# Patient Record
Sex: Male | Born: 1935 | Race: White | Hispanic: No | Smoking: Former smoker
Health system: Southern US, Community
[De-identification: ages and names within clinical notes are randomized; demographics above are authoritative.]

## PROBLEM LIST (undated history)

## (undated) DIAGNOSIS — I739 Peripheral vascular disease, unspecified: Secondary | ICD-10-CM

## (undated) DIAGNOSIS — Z9889 Other specified postprocedural states: Secondary | ICD-10-CM

## (undated) DIAGNOSIS — A4902 Methicillin resistant Staphylococcus aureus infection, unspecified site: Secondary | ICD-10-CM

## (undated) DIAGNOSIS — Z8679 Personal history of other diseases of the circulatory system: Secondary | ICD-10-CM

## (undated) DIAGNOSIS — M5136 Other intervertebral disc degeneration, lumbar region: Secondary | ICD-10-CM

## (undated) DIAGNOSIS — E119 Type 2 diabetes mellitus without complications: Secondary | ICD-10-CM

## (undated) DIAGNOSIS — I509 Heart failure, unspecified: Secondary | ICD-10-CM

## (undated) DIAGNOSIS — J111 Influenza due to unidentified influenza virus with other respiratory manifestations: Secondary | ICD-10-CM

## (undated) DIAGNOSIS — M810 Age-related osteoporosis without current pathological fracture: Secondary | ICD-10-CM

## (undated) DIAGNOSIS — M51369 Other intervertebral disc degeneration, lumbar region without mention of lumbar back pain or lower extremity pain: Secondary | ICD-10-CM

## (undated) DIAGNOSIS — K219 Gastro-esophageal reflux disease without esophagitis: Secondary | ICD-10-CM

## (undated) DIAGNOSIS — Z87891 Personal history of nicotine dependence: Secondary | ICD-10-CM

## (undated) DIAGNOSIS — I219 Acute myocardial infarction, unspecified: Secondary | ICD-10-CM

## (undated) DIAGNOSIS — I10 Essential (primary) hypertension: Secondary | ICD-10-CM

## (undated) DIAGNOSIS — I639 Cerebral infarction, unspecified: Secondary | ICD-10-CM

## (undated) DIAGNOSIS — Z9289 Personal history of other medical treatment: Secondary | ICD-10-CM

## (undated) DIAGNOSIS — R0600 Dyspnea, unspecified: Secondary | ICD-10-CM

## (undated) DIAGNOSIS — J302 Other seasonal allergic rhinitis: Secondary | ICD-10-CM

## (undated) DIAGNOSIS — E7800 Pure hypercholesterolemia, unspecified: Secondary | ICD-10-CM

## (undated) DIAGNOSIS — D649 Anemia, unspecified: Secondary | ICD-10-CM

## (undated) DIAGNOSIS — I6522 Occlusion and stenosis of left carotid artery: Secondary | ICD-10-CM

## (undated) HISTORY — DX: Influenza due to unidentified influenza virus with other respiratory manifestations: J11.1

## (undated) HISTORY — PX: CLEFT LIP REPAIR: SUR1164

## (undated) HISTORY — DX: Cerebral infarction, unspecified: I63.9

## (undated) HISTORY — DX: Heart failure, unspecified: I50.9

## (undated) HISTORY — DX: Personal history of nicotine dependence: Z87.891

## (undated) HISTORY — PX: ILIAC ARTERY STENT: SHX1786

## (undated) HISTORY — DX: Acute myocardial infarction, unspecified: I21.9

## (undated) HISTORY — DX: Anemia, unspecified: D64.9

## (undated) HISTORY — PX: VASCULAR SURGERY: SHX849

## (undated) HISTORY — PX: HEMIARTHROPLASTY HIP: SUR652

---

## 1997-03-31 HISTORY — PX: PR VEIN BYPASS GRAFT,AORTO-FEM-POP: 35551

## 1997-08-14 ENCOUNTER — Ambulatory Visit (HOSPITAL_COMMUNITY): Admission: RE | Admit: 1997-08-14 | Discharge: 1997-08-14 | Payer: Self-pay | Admitting: *Deleted

## 1997-08-18 ENCOUNTER — Inpatient Hospital Stay (HOSPITAL_COMMUNITY): Admission: RE | Admit: 1997-08-18 | Discharge: 1997-08-19 | Payer: Self-pay | Admitting: *Deleted

## 1999-04-01 HISTORY — PX: BACK SURGERY: SHX140

## 1999-08-06 ENCOUNTER — Other Ambulatory Visit: Admission: RE | Admit: 1999-08-06 | Discharge: 1999-08-06 | Payer: Self-pay | Admitting: *Deleted

## 1999-11-11 ENCOUNTER — Ambulatory Visit (HOSPITAL_COMMUNITY): Admission: RE | Admit: 1999-11-11 | Discharge: 1999-11-11 | Payer: Self-pay | Admitting: Neurosurgery

## 1999-11-11 ENCOUNTER — Encounter: Payer: Self-pay | Admitting: Neurosurgery

## 1999-12-13 ENCOUNTER — Encounter: Payer: Self-pay | Admitting: Neurosurgery

## 1999-12-13 ENCOUNTER — Ambulatory Visit (HOSPITAL_COMMUNITY): Admission: RE | Admit: 1999-12-13 | Discharge: 1999-12-13 | Payer: Self-pay | Admitting: Neurosurgery

## 1999-12-27 ENCOUNTER — Ambulatory Visit (HOSPITAL_COMMUNITY): Admission: RE | Admit: 1999-12-27 | Discharge: 1999-12-27 | Payer: Self-pay | Admitting: Neurosurgery

## 1999-12-27 ENCOUNTER — Encounter: Payer: Self-pay | Admitting: Neurosurgery

## 2000-04-13 ENCOUNTER — Encounter: Payer: Self-pay | Admitting: Neurosurgery

## 2000-04-16 ENCOUNTER — Inpatient Hospital Stay (HOSPITAL_COMMUNITY): Admission: RE | Admit: 2000-04-16 | Discharge: 2000-04-18 | Payer: Self-pay | Admitting: Neurosurgery

## 2000-04-16 ENCOUNTER — Encounter: Payer: Self-pay | Admitting: Neurosurgery

## 2000-05-06 ENCOUNTER — Encounter: Payer: Self-pay | Admitting: Neurosurgery

## 2000-05-06 ENCOUNTER — Ambulatory Visit (HOSPITAL_COMMUNITY): Admission: RE | Admit: 2000-05-06 | Discharge: 2000-05-06 | Payer: Self-pay | Admitting: Neurosurgery

## 2000-05-08 ENCOUNTER — Ambulatory Visit (HOSPITAL_COMMUNITY): Admission: RE | Admit: 2000-05-08 | Discharge: 2000-05-08 | Payer: Self-pay | Admitting: Neurosurgery

## 2000-05-08 ENCOUNTER — Encounter: Payer: Self-pay | Admitting: Neurosurgery

## 2000-05-09 ENCOUNTER — Emergency Department (HOSPITAL_COMMUNITY): Admission: EM | Admit: 2000-05-09 | Discharge: 2000-05-09 | Payer: Self-pay | Admitting: *Deleted

## 2000-05-10 ENCOUNTER — Emergency Department (HOSPITAL_COMMUNITY): Admission: EM | Admit: 2000-05-10 | Discharge: 2000-05-10 | Payer: Self-pay | Admitting: Emergency Medicine

## 2000-05-11 ENCOUNTER — Encounter (HOSPITAL_COMMUNITY): Admission: RE | Admit: 2000-05-11 | Discharge: 2000-08-09 | Payer: Self-pay | Admitting: Neurosurgery

## 2000-05-16 ENCOUNTER — Emergency Department (HOSPITAL_COMMUNITY): Admission: EM | Admit: 2000-05-16 | Discharge: 2000-05-16 | Payer: Self-pay | Admitting: Emergency Medicine

## 2000-05-17 ENCOUNTER — Emergency Department (HOSPITAL_COMMUNITY): Admission: EM | Admit: 2000-05-17 | Discharge: 2000-05-17 | Payer: Self-pay | Admitting: Emergency Medicine

## 2000-05-23 ENCOUNTER — Emergency Department (HOSPITAL_COMMUNITY): Admission: EM | Admit: 2000-05-23 | Discharge: 2000-05-23 | Payer: Self-pay | Admitting: *Deleted

## 2000-05-24 ENCOUNTER — Emergency Department (HOSPITAL_COMMUNITY): Admission: EM | Admit: 2000-05-24 | Discharge: 2000-05-24 | Payer: Self-pay | Admitting: Emergency Medicine

## 2000-05-30 ENCOUNTER — Emergency Department (HOSPITAL_COMMUNITY): Admission: EM | Admit: 2000-05-30 | Discharge: 2000-05-30 | Payer: Self-pay | Admitting: Emergency Medicine

## 2000-05-31 ENCOUNTER — Emergency Department (HOSPITAL_COMMUNITY): Admission: EM | Admit: 2000-05-31 | Discharge: 2000-05-31 | Payer: Self-pay | Admitting: Emergency Medicine

## 2000-06-02 ENCOUNTER — Ambulatory Visit (HOSPITAL_COMMUNITY): Admission: RE | Admit: 2000-06-02 | Discharge: 2000-06-02 | Payer: Self-pay | Admitting: Neurosurgery

## 2000-06-02 ENCOUNTER — Encounter: Payer: Self-pay | Admitting: Neurosurgery

## 2000-07-16 ENCOUNTER — Encounter: Admission: RE | Admit: 2000-07-16 | Discharge: 2000-07-16 | Payer: Self-pay | Admitting: Neurosurgery

## 2000-07-16 ENCOUNTER — Encounter: Payer: Self-pay | Admitting: Neurosurgery

## 2000-07-20 ENCOUNTER — Inpatient Hospital Stay (HOSPITAL_COMMUNITY): Admission: EM | Admit: 2000-07-20 | Discharge: 2000-09-28 | Payer: Self-pay | Admitting: Emergency Medicine

## 2000-07-20 ENCOUNTER — Encounter (INDEPENDENT_AMBULATORY_CARE_PROVIDER_SITE_OTHER): Payer: Self-pay | Admitting: *Deleted

## 2000-07-21 ENCOUNTER — Encounter: Payer: Self-pay | Admitting: Internal Medicine

## 2000-07-23 ENCOUNTER — Encounter: Payer: Self-pay | Admitting: Internal Medicine

## 2000-07-28 ENCOUNTER — Encounter: Payer: Self-pay | Admitting: Neurosurgery

## 2000-07-28 ENCOUNTER — Encounter: Payer: Self-pay | Admitting: Internal Medicine

## 2000-07-29 ENCOUNTER — Encounter: Payer: Self-pay | Admitting: Internal Medicine

## 2000-07-29 HISTORY — PX: OTHER SURGICAL HISTORY: SHX169

## 2000-07-30 ENCOUNTER — Encounter: Payer: Self-pay | Admitting: Internal Medicine

## 2000-07-31 ENCOUNTER — Encounter: Payer: Self-pay | Admitting: Internal Medicine

## 2000-08-05 ENCOUNTER — Encounter: Payer: Self-pay | Admitting: Internal Medicine

## 2000-08-06 ENCOUNTER — Encounter: Payer: Self-pay | Admitting: Internal Medicine

## 2000-08-11 ENCOUNTER — Encounter: Payer: Self-pay | Admitting: Internal Medicine

## 2000-08-18 ENCOUNTER — Encounter: Payer: Self-pay | Admitting: Infectious Diseases

## 2000-08-20 ENCOUNTER — Encounter: Payer: Self-pay | Admitting: Internal Medicine

## 2000-08-21 ENCOUNTER — Encounter: Payer: Self-pay | Admitting: Internal Medicine

## 2000-08-31 ENCOUNTER — Encounter: Payer: Self-pay | Admitting: Internal Medicine

## 2000-09-01 ENCOUNTER — Encounter: Payer: Self-pay | Admitting: Internal Medicine

## 2000-09-03 ENCOUNTER — Encounter: Payer: Self-pay | Admitting: Internal Medicine

## 2000-09-07 ENCOUNTER — Encounter: Payer: Self-pay | Admitting: Internal Medicine

## 2000-09-11 ENCOUNTER — Encounter: Payer: Self-pay | Admitting: Surgery

## 2000-09-12 ENCOUNTER — Encounter: Payer: Self-pay | Admitting: Surgery

## 2000-09-13 ENCOUNTER — Encounter: Payer: Self-pay | Admitting: Surgery

## 2000-09-14 ENCOUNTER — Encounter: Payer: Self-pay | Admitting: *Deleted

## 2000-09-18 ENCOUNTER — Encounter: Payer: Self-pay | Admitting: Surgery

## 2000-09-28 ENCOUNTER — Inpatient Hospital Stay: Admission: RE | Admit: 2000-09-28 | Discharge: 2000-10-26 | Payer: Self-pay | Admitting: Internal Medicine

## 2000-10-12 ENCOUNTER — Encounter: Payer: Self-pay | Admitting: Internal Medicine

## 2000-10-12 ENCOUNTER — Ambulatory Visit (HOSPITAL_COMMUNITY): Admission: RE | Admit: 2000-10-12 | Discharge: 2000-10-12 | Payer: Self-pay | Admitting: Internal Medicine

## 2000-10-19 ENCOUNTER — Ambulatory Visit (HOSPITAL_COMMUNITY): Admission: RE | Admit: 2000-10-19 | Discharge: 2000-10-19 | Payer: Self-pay | Admitting: Internal Medicine

## 2000-10-19 ENCOUNTER — Encounter: Payer: Self-pay | Admitting: Internal Medicine

## 2000-12-10 ENCOUNTER — Encounter: Payer: Self-pay | Admitting: Neurosurgery

## 2000-12-10 ENCOUNTER — Encounter: Admission: RE | Admit: 2000-12-10 | Discharge: 2000-12-10 | Payer: Self-pay | Admitting: Neurosurgery

## 2000-12-22 ENCOUNTER — Ambulatory Visit (HOSPITAL_COMMUNITY): Admission: RE | Admit: 2000-12-22 | Discharge: 2000-12-22 | Payer: Self-pay | Admitting: *Deleted

## 2000-12-22 ENCOUNTER — Encounter: Payer: Self-pay | Admitting: *Deleted

## 2001-01-08 ENCOUNTER — Ambulatory Visit (HOSPITAL_COMMUNITY): Admission: RE | Admit: 2001-01-08 | Discharge: 2001-01-08 | Payer: Self-pay | Admitting: *Deleted

## 2001-01-18 ENCOUNTER — Encounter: Admission: RE | Admit: 2001-01-18 | Discharge: 2001-04-18 | Payer: Self-pay | Admitting: Internal Medicine

## 2001-01-22 ENCOUNTER — Encounter: Payer: Self-pay | Admitting: *Deleted

## 2001-01-22 ENCOUNTER — Ambulatory Visit (HOSPITAL_COMMUNITY): Admission: RE | Admit: 2001-01-22 | Discharge: 2001-01-22 | Payer: Self-pay | Admitting: *Deleted

## 2001-01-27 ENCOUNTER — Encounter: Admission: RE | Admit: 2001-01-27 | Discharge: 2001-03-02 | Payer: Self-pay | Admitting: Internal Medicine

## 2001-02-22 ENCOUNTER — Encounter: Admission: RE | Admit: 2001-02-22 | Discharge: 2001-02-22 | Payer: Self-pay | Admitting: Infectious Diseases

## 2001-02-24 ENCOUNTER — Encounter: Payer: Self-pay | Admitting: Infectious Diseases

## 2001-02-24 ENCOUNTER — Ambulatory Visit (HOSPITAL_COMMUNITY): Admission: RE | Admit: 2001-02-24 | Discharge: 2001-02-24 | Payer: Self-pay | Admitting: Infectious Diseases

## 2001-03-01 ENCOUNTER — Encounter: Admission: RE | Admit: 2001-03-01 | Discharge: 2001-03-01 | Payer: Self-pay | Admitting: Infectious Diseases

## 2001-03-02 ENCOUNTER — Inpatient Hospital Stay (HOSPITAL_COMMUNITY): Admission: AD | Admit: 2001-03-02 | Discharge: 2001-03-05 | Payer: Self-pay | Admitting: Internal Medicine

## 2001-03-04 ENCOUNTER — Encounter: Payer: Self-pay | Admitting: Internal Medicine

## 2001-03-25 ENCOUNTER — Encounter: Admission: RE | Admit: 2001-03-25 | Discharge: 2001-06-23 | Payer: Self-pay | Admitting: Internal Medicine

## 2001-03-30 ENCOUNTER — Encounter: Admission: RE | Admit: 2001-03-30 | Discharge: 2001-04-12 | Payer: Self-pay | Admitting: Orthopedic Surgery

## 2001-03-31 HISTORY — PX: JOINT REPLACEMENT: SHX530

## 2001-03-31 HISTORY — PX: THORACIC AORTIC ENDOVASCULAR STENT GRAFT: SHX6112

## 2001-05-06 ENCOUNTER — Encounter: Admission: RE | Admit: 2001-05-06 | Discharge: 2001-08-04 | Payer: Self-pay | Admitting: Internal Medicine

## 2001-06-16 ENCOUNTER — Encounter: Payer: Self-pay | Admitting: *Deleted

## 2001-06-16 ENCOUNTER — Encounter: Admission: RE | Admit: 2001-06-16 | Discharge: 2001-06-16 | Payer: Self-pay | Admitting: *Deleted

## 2001-07-13 ENCOUNTER — Encounter (HOSPITAL_BASED_OUTPATIENT_CLINIC_OR_DEPARTMENT_OTHER): Admission: RE | Admit: 2001-07-13 | Discharge: 2001-07-19 | Payer: Self-pay | Admitting: Orthopedic Surgery

## 2001-08-02 ENCOUNTER — Encounter: Payer: Self-pay | Admitting: Neurosurgery

## 2001-08-02 ENCOUNTER — Ambulatory Visit (HOSPITAL_COMMUNITY): Admission: RE | Admit: 2001-08-02 | Discharge: 2001-08-02 | Payer: Self-pay | Admitting: Neurosurgery

## 2001-11-01 ENCOUNTER — Encounter (HOSPITAL_BASED_OUTPATIENT_CLINIC_OR_DEPARTMENT_OTHER): Admission: RE | Admit: 2001-11-01 | Discharge: 2002-01-30 | Payer: Self-pay | Admitting: Orthopedic Surgery

## 2001-11-04 ENCOUNTER — Encounter: Admission: RE | Admit: 2001-11-04 | Discharge: 2002-02-02 | Payer: Self-pay | Admitting: Internal Medicine

## 2001-12-09 IMAGING — RF IR CENTRAL LINE *R*
1 series · 1 of 1 positions shown · non-contrast
Comparison: none

FINDINGS
CLINICAL DATA: PATIENT HAS SEPSIS WITH MRSA. PATIENT HAS NEED FOR IV ACCESS.
ULTRASOUND GUIDED VENOUS ACCESS:
USING STANDARD STERILE 21 GAUGE SINGLE WALL TECHNIQUE AND DIRECT ULTRASOUND VISUALIZATION, THE
RIGHT BRACHIAL VEIN WAS ACCESSED WITHOUT DIFFICULTY.
IMPRESSION
STATUS-POST ULTRASOUND GUIDED VENOUS ACCESS.
FIVE FRENCH DOUBLE LUMEN PICC LINE PLACEMENT:
USING STANDARD STERILE FLUOROSCOPIC TECHNIQUE, A 40 CM, FIVE FRENCH DOUBLE LUMEN PICC LINE WAS
PLACED VIA THE RIGHT UPPER EXTREMITY WITH THE TIP AT THE JUNCTION OF THE SUPERIOR VENA CAVA AND
RIGHT ATRIUM. THE CATHETER WAS ANCHORED TO THE SKIN WITH PROLENE SUTURE, FLUSHED WITH HEPARIN, AND
IS READY FOR IMMEDIATE USE.

[Series 1: run · 1 of 1 slices shown]
[im 1/1]
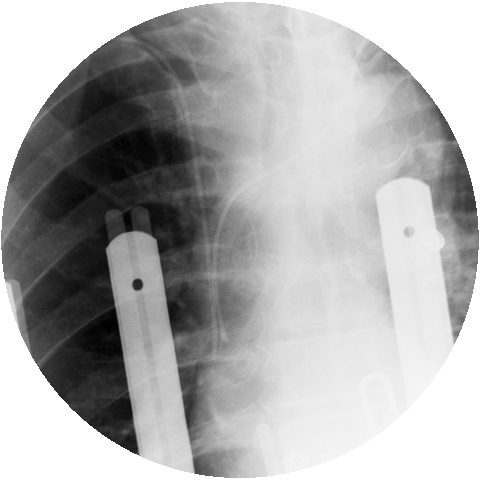

[1 of 1 positions shown; findings below may reference images not displayed]

IMPRESSION: STATUS-POST FIVE FRENCH DUAL LUMEN PICC LINE PLACEMENT.

## 2002-01-13 ENCOUNTER — Encounter: Admission: RE | Admit: 2002-01-13 | Discharge: 2002-01-24 | Payer: Self-pay | Admitting: Internal Medicine

## 2002-01-28 ENCOUNTER — Inpatient Hospital Stay (HOSPITAL_COMMUNITY): Admission: RE | Admit: 2002-01-28 | Discharge: 2002-02-01 | Payer: Self-pay | Admitting: Orthopedic Surgery

## 2002-01-28 ENCOUNTER — Encounter: Payer: Self-pay | Admitting: Orthopedic Surgery

## 2002-01-29 ENCOUNTER — Encounter: Payer: Self-pay | Admitting: Orthopedic Surgery

## 2002-02-01 ENCOUNTER — Inpatient Hospital Stay (HOSPITAL_COMMUNITY)
Admission: RE | Admit: 2002-02-01 | Discharge: 2002-02-07 | Payer: Self-pay | Admitting: Physical Medicine & Rehabilitation

## 2002-02-14 ENCOUNTER — Encounter (HOSPITAL_BASED_OUTPATIENT_CLINIC_OR_DEPARTMENT_OTHER): Admission: RE | Admit: 2002-02-14 | Discharge: 2002-05-15 | Payer: Self-pay | Admitting: Internal Medicine

## 2002-03-22 ENCOUNTER — Encounter: Admission: RE | Admit: 2002-03-22 | Discharge: 2002-04-20 | Payer: Self-pay | Admitting: Orthopedic Surgery

## 2002-05-05 ENCOUNTER — Encounter: Admission: RE | Admit: 2002-05-05 | Discharge: 2002-08-03 | Payer: Self-pay | Admitting: Internal Medicine

## 2002-05-20 ENCOUNTER — Encounter (HOSPITAL_BASED_OUTPATIENT_CLINIC_OR_DEPARTMENT_OTHER): Admission: RE | Admit: 2002-05-20 | Discharge: 2002-08-18 | Payer: Self-pay | Admitting: Internal Medicine

## 2002-11-01 ENCOUNTER — Encounter: Admission: RE | Admit: 2002-11-01 | Discharge: 2003-01-30 | Payer: Self-pay | Admitting: Internal Medicine

## 2002-11-24 ENCOUNTER — Encounter (HOSPITAL_BASED_OUTPATIENT_CLINIC_OR_DEPARTMENT_OTHER): Admission: RE | Admit: 2002-11-24 | Discharge: 2002-12-06 | Payer: Self-pay | Admitting: Internal Medicine

## 2003-03-11 ENCOUNTER — Emergency Department (HOSPITAL_COMMUNITY): Admission: EM | Admit: 2003-03-11 | Discharge: 2003-03-12 | Payer: Self-pay | Admitting: Emergency Medicine

## 2003-05-09 ENCOUNTER — Encounter: Admission: RE | Admit: 2003-05-09 | Discharge: 2003-08-07 | Payer: Self-pay | Admitting: Internal Medicine

## 2003-05-25 ENCOUNTER — Encounter (HOSPITAL_BASED_OUTPATIENT_CLINIC_OR_DEPARTMENT_OTHER): Admission: RE | Admit: 2003-05-25 | Discharge: 2003-06-05 | Payer: Self-pay | Admitting: Internal Medicine

## 2003-11-13 ENCOUNTER — Encounter: Admission: RE | Admit: 2003-11-13 | Discharge: 2003-11-13 | Payer: Self-pay | Admitting: Internal Medicine

## 2003-11-15 ENCOUNTER — Encounter (HOSPITAL_BASED_OUTPATIENT_CLINIC_OR_DEPARTMENT_OTHER): Admission: RE | Admit: 2003-11-15 | Discharge: 2003-11-23 | Payer: Self-pay | Admitting: Internal Medicine

## 2004-06-11 ENCOUNTER — Encounter (HOSPITAL_BASED_OUTPATIENT_CLINIC_OR_DEPARTMENT_OTHER): Admission: RE | Admit: 2004-06-11 | Discharge: 2004-06-21 | Payer: Self-pay | Admitting: Internal Medicine

## 2006-08-14 ENCOUNTER — Ambulatory Visit: Payer: Self-pay | Admitting: *Deleted

## 2007-02-22 ENCOUNTER — Ambulatory Visit: Payer: Self-pay | Admitting: *Deleted

## 2007-08-19 ENCOUNTER — Ambulatory Visit: Payer: Self-pay | Admitting: *Deleted

## 2008-02-03 ENCOUNTER — Ambulatory Visit: Payer: Self-pay | Admitting: *Deleted

## 2008-08-10 ENCOUNTER — Ambulatory Visit: Payer: Self-pay | Admitting: *Deleted

## 2009-01-31 ENCOUNTER — Ambulatory Visit: Payer: Self-pay | Admitting: Vascular Surgery

## 2009-08-10 ENCOUNTER — Ambulatory Visit: Payer: Self-pay | Admitting: Vascular Surgery

## 2009-08-23 ENCOUNTER — Ambulatory Visit (HOSPITAL_COMMUNITY): Admission: RE | Admit: 2009-08-23 | Discharge: 2009-08-23 | Payer: Self-pay | Admitting: Gastroenterology

## 2009-09-12 ENCOUNTER — Ambulatory Visit: Payer: Self-pay | Admitting: Vascular Surgery

## 2009-10-29 HISTORY — PX: TRANSTHORACIC ECHOCARDIOGRAM: SHX275

## 2010-03-20 ENCOUNTER — Ambulatory Visit: Payer: Self-pay | Admitting: Vascular Surgery

## 2010-05-06 NOTE — Procedures (Unsigned)
CAROTID DUPLEX EXAM  INDICATION:  Carotid disease.  HISTORY: Diabetes:  Yes. Cardiac:  No. Hypertension:  Yes. Smoking:  Previous. Previous Surgery:  Innominate and aortic arch replacement. CV History:  Currently asymptomatic. Amaurosis Fugax No, Paresthesias No, Hemiparesis No                                      RIGHT             LEFT Brachial systolic pressure:         125               132 Brachial Doppler waveforms:         Normal            Normal Vertebral direction of flow:        Antegrade         Antegrade DUPLEX VELOCITIES (cm/sec) CCA peak systolic                   101               Occluded ECA peak systolic                   162               53 (retrograde) ICA peak systolic                   223               100 ICA end diastolic                   42                37 PLAQUE MORPHOLOGY:                  Heterogeneous     Heterogeneous PLAQUE AMOUNT:                      Moderate          Occlusive PLAQUE LOCATION:                    ICA / ECA         ICA / CCA  IMPRESSION: 1. Doppler velocities suggest a high end 40% to 59% stenosis of the     right proximal internal carotid artery. 2. The left common carotid artery appears to be totally occluded with     no hemodynamically significant stenosis at the left internal     carotid artery which is supplied via a retrograde external carotid     artery. 3. No significant change in the velocities of the bilateral internal     carotid arteries noted when compared to the previous exam.     However, the left common carotid artery now appears to be occluded. 4. Based on the above findings, and at the advisement of Dr. Imogene Burn on     04/26/2010, an appointment to see me in the office was scheduled.      ___________________________________________ Janetta Hora Fields, MD  CH/MEDQ  D:  04/29/2010  T:  04/29/2010  Job:  [id number]

## 2010-05-06 NOTE — Procedures (Unsigned)
BYPASS GRAFT EVALUATION  INDICATION:  Left lower extremity bypass graft.  HISTORY: Diabetes:  Yes. Cardiac:  No. Hypertension:  Yes. Smoking:  Previous. Previous Surgery:  Repair of left fem-pop bypass graft stenosis in 1999, right external iliac artery PTA in 1997.  SINGLE LEVEL ARTERIAL EXAM                              RIGHT              LEFT Brachial:                    125                132 Anterior tibial:             93                 128 Posterior tibial:            98                 138 Peroneal: Ankle/brachial index:        0.74               1.05  PREVIOUS ABI:  Date:  08/10/2009  RIGHT:  0.71  LEFT:  1.01  LOWER EXTREMITY BYPASS GRAFT DUPLEX EXAM:  DUPLEX:  Biphasic Doppler waveforms noted throughout the left lower extremity bypass graft with an increased velocity of 290 cm/s noted at the left proximal anastomosis.  This area of increased velocity appears to be due to significant plaque formation of the left common femoral artery since no internal narrowing of the bypass graft is noted.  IMPRESSION: 1. Patent left femoral-popliteal bypass graft with an increased     velocity noted as described above. 2. Bilateral ankle brachial indices and Doppler waveforms suggest     moderately decreased perfusion of the right lower extremity and     normal perfusion of the left lower extremity.  The bilateral ankle     brachial indices appears stable when compared to the previous exam.  ___________________________________________ Janetta Hora. Fields, MD  CH/MEDQ  D:  04/29/2010  T:  04/29/2010  Job:  [id number]

## 2010-05-09 ENCOUNTER — Ambulatory Visit (INDEPENDENT_AMBULATORY_CARE_PROVIDER_SITE_OTHER): Payer: Medicare Other | Admitting: Vascular Surgery

## 2010-05-09 DIAGNOSIS — I6529 Occlusion and stenosis of unspecified carotid artery: Secondary | ICD-10-CM

## 2010-05-10 NOTE — Assessment & Plan Note (Signed)
OFFICE VISIT  Christopher, [name] DOB:  [birth year]                                       05/09/2010 ZOXWR#:[id number]  The patient returns for followup today.  He previously underwent an aorto to innominate and left carotid bypass by Dr. Madilyn Fireman in 2002.  He was noted on recent surveillance duplex ultrasound to have occlusion of the left common carotid artery.  Of note, he has previously had a left femoral-popliteal bypass in 1999 and recently was noted to have increased velocities about 7 months ago but refused arteriogram at that time.  The patient currently is on aspirin.  He denies any signs or symptoms of stroke, TIA or amaurosis.  He denies any fever or chills.  He denies any claudication symptoms in his lower extremities.  REVIEW OF SYSTEMS:  He denies any shortness of breath or chest pain.  CHRONIC MEDICAL PROBLEMS:  Include degenerative arthritis, elevated triglycerides, diabetes, hypertension, chronic back pain.  All of these are currently controlled and followed by Dr. Waynard Edwards.  PHYSICAL EXAM:  Vital signs:  Blood pressure is 125/70 in the right arm, 153/65 in the left arm, heart rate 69 and regular, respirations 22. HEENT:  Unremarkable.  Neck:  Has 2+ carotid pulses with bilateral bruits.  Chest:  Clear to auscultation.  Cardiac:  Regular rate and rhythm with a bruit heard throughout the precordium.  Upper extremities: He has 2+ brachial and absent radial pulses bilaterally.  Abdomen: Soft, nontender, nondistended.  Lower extremities:  He has a 2+ left posterior tibial pulse.  He has absent pedal pulses in the right foot. Neurological:  Shows symmetric upper extremity and lower extremity strength which is 5/5.  Skin:  Has no open ulcers or rashes.  In summary, the patient has had an occlusion of his left common carotid bypass.  He is asymptomatic from this.  Most likely this will not need any further intervention.  However, since his bypass was  originally done in the face of possible infection I believe he needs a CT scan of the neck and chest to make sure that the bypass graft is otherwise intact and he has no other signs and symptoms of infection for the remaining portion of the graft.  He will follow up in 2 weeks' time to review the CT findings.    Janetta Hora. Noah Pelaez, MD Electronically Signed  CEF/MEDQ  D:  05/09/2010  T:  05/10/2010  Job:  [id number]  cc:   Loraine Leriche A. [name], M.D. Dr Jamesetta So

## 2010-05-15 ENCOUNTER — Other Ambulatory Visit: Payer: Self-pay | Admitting: Vascular Surgery

## 2010-05-15 DIAGNOSIS — I7090 Unspecified atherosclerosis: Secondary | ICD-10-CM

## 2010-05-30 ENCOUNTER — Ambulatory Visit
Admission: RE | Admit: 2010-05-30 | Discharge: 2010-05-30 | Disposition: A | Discharge status: Home / Self Care | Payer: Medicare Other | Source: Ambulatory Visit | Attending: Vascular Surgery | Admitting: Vascular Surgery

## 2010-05-30 ENCOUNTER — Ambulatory Visit (INDEPENDENT_AMBULATORY_CARE_PROVIDER_SITE_OTHER): Payer: Medicare Other | Admitting: Vascular Surgery

## 2010-05-30 DIAGNOSIS — I7090 Unspecified atherosclerosis: Secondary | ICD-10-CM

## 2010-05-30 DIAGNOSIS — I6529 Occlusion and stenosis of unspecified carotid artery: Secondary | ICD-10-CM

## 2010-05-30 MED ORDER — IOHEXOL 350 MG/ML SOLN
150.0000 mL | Freq: Once | INTRAVENOUS | Status: AC | PRN
  Administered 2010-05-30: 150 mL via INTRAVENOUS

## 2010-05-31 NOTE — Assessment & Plan Note (Signed)
OFFICE VISIT  [name], [name] DOB:  1930/11/03                                       05/30/2010 ZOXWR#:[id number]  The patient returns for followup today.  He was last seen on 05/09/2010. At that time he was noted to have occlusion of his left common carotid bypass graft that had been previously done by Christopher. Madilyn Fireman.  He was sent for CT angiogram of the neck to make sure there was no evidence of infection since the graft had been placed in the presence of infection previously.  He continues to deny any fever or chills and feels well overall.  He has had no complaints.  He has no symptoms of TIA, amaurosis or stroke.  REVIEW OF SYSTEMS:  He denies any fever or chills, weight loss.  He denies any skin ulcerations.  PHYSICAL EXAMINATION:  Blood pressure is 157/84 in the right arm, 181/72 in the left arm, heart rate is 111 and regular, respirations 16.  Neck: Has 2+ right carotid pulse, has absent left carotid pulse.  CT angiogram of the neck and chest were reviewed today which shows a patent limb to the right common carotid artery.  The limb to the left common carotid artery is occluded as suggested on ultrasound.  There is reconstitution of the distal internal carotid artery.  There was no perigraft fluid.  There was no perigraft edema.  There was no air round the graft suggesting infection.  At this point the patient has occlusion of the left common carotid artery bypass graft.  There is no indication at this point to redo this bypass as he does not have any symptoms from this and his right side is widely patent.  I believe the best option at this point would be to obtain a followup duplex scan in 6 months' time and continued surveillance.  All the findings were discussed with the patient today. He will return for followup in 6 months' time.    Christopher Hora. Orel Cooler, MD Electronically Signed  CEF/MEDQ  D:  05/30/2010  T:  05/31/2010  Job:  [id number]  cc:    [name], M.D. [name]

## 2010-06-07 ENCOUNTER — Encounter (HOSPITAL_COMMUNITY): Payer: Medicare Other | Attending: Internal Medicine

## 2010-06-07 DIAGNOSIS — M810 Age-related osteoporosis without current pathological fracture: Secondary | ICD-10-CM | POA: Insufficient documentation

## 2010-08-13 NOTE — Procedures (Signed)
BYPASS GRAFT EVALUATION   INDICATION:  Left fem-pop bypass graft.   HISTORY:  Diabetes:  Yes.  Cardiac:  Innominate aortic arch replacement.  Hypertension:  Yes.  Smoking:  Previous.  Previous Surgery:  Repair of stenosis in the left fem-pop graft in 1999,  right EIA PTA in 1997.   SINGLE LEVEL ARTERIAL EXAM                               RIGHT              LEFT  Brachial:                    132                147  Anterior tibial:             86                 138  Posterior tibial:            105                148  Peroneal:  Ankle/brachial index:        0.71               1.01   PREVIOUS ABI:  Date: 02/01/09  RIGHT:  0.70  LEFT:  0.95   LOWER EXTREMITY BYPASS GRAFT DUPLEX EXAM:   DUPLEX:  Biphasic Doppler waveforms noted throughout the left lower  extremity bypass graft and its native vessels with an increased velocity  of 306 cm/s noted at the proximal anastomosis level.   IMPRESSION:  1. Patent left femoropopliteal bypass graft with increased velocities      noted, as described above.  2. Stable ankle brachial indices.        ___________________________________________  Janetta Hora Fields, MD   NT/MEDQ  D:  08/10/2009  T:  08/10/2009  Job:  [id number]

## 2010-08-13 NOTE — Consult Note (Signed)
NEW PATIENT CONSULTATION   [name]  DOB:  05-05-1930                                       08/10/2009  EAVWU#:[id number]   REASON FOR VISIT:  Continued follow-up of left femoral-popliteal bypass  graft and ascending aorta innominate artery bypass and left common  carotid artery bypass.   Patient is a 77 year old Caucasian male with a complicated past medical  history.  He is status post a left femoral-popliteal artery bypass with  saphenous vein graft in November 1998.  This required revision for vein  graft stenosis in 08/16/1997.  These were both done by Dr. Denman George.  In June 2002, he underwent ascending aorta to innominate artery  bypass and left common carotid artery bypass by Dr. Evelene Croon and Dr.  Denman George.  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  Rifampin-  coated grafts were utilized.  He had an extensive hospitalization and  actually was known to have a descending thoracic penetrating ulcer which  was ultimately treated in [facility], which sounds like a stent graft.  This was done by Dr. Pattricia Boss, and in fact the patient was supposed to see  him last month and had rescheduled due to his son's hospitalization.  He  has not been seen by a physician in our office since November 2002.  He  has been coming in approximately every 6 months, however, for P scans to  evaluate his left lower extremity bypass graft.  He reports that he has  had some slight increase in his left lower extremity numbness,  particularly in his toes, over the past year.  He has had chronic  numbness in his legs  since 2002 and says he has chronic back pain from  degenerative disk disease.  His back pain radiates to bilateral legs and  is worse with sitting.  He denies any specific claudication symptoms or  rest pain.  He has not had any foot ulcers either.  He ambulates with a  cane or rolling walker.  His mobility distances is somewhat limited, but  at this point he is able to go shop for his own groceries.   Past medical history is significant for right femoral neck fracture,  status post right hip hemiarthroplasty by Dr. Despina Hick in 2003.  MRSA  bacteremia in February 2002, as mentioned, secondary to epidural abscess  following a laminectomy, history of cleft palate, hypertriglyceridemia,  diabetes mellitus type 2, hypertension, chronic back pain with spinal  stenosis, insomnia, left lower extremity bypass, innominate and left  common carotid artery bypass, as discussed above.  He also suffers from  cataracts and diplopia.  He says the diplopia has been present since  2002.   His family history is significant for a son with Marfan's syndrome, who  had surgery at [facility] for placement of aortic valve and aortic  root.  He said his father had heart trouble in his 33s.  He is widowed  and retired.  He quit smoking  in 2002 and does not drink alcohol on a  regular basis.   REVIEW OF SYSTEMS:  Positive for no significant weight changes with  stated weight of 168, height of 5 feet 5 inches.  He denies any chest  pain or cardiac arrhythmias.  He does get shortness of breath with  exertion.  GI.  He reports severe constipation and reports he has a colonoscopy  scheduled in 2 weeks.  GU:  He denies dysuria.  VASCULAR:  As stated above.  NEUROLOGIC:  He denies any syncope or seizures.  MUSCULOSKELETAL:  As stated above.  He has no specific joint pains but  does have chronic back pain.  PSYCHIATRIC:  He denies history of anxiety or depression.  ENT:  Other than the above-mentioned diplopia since  2002 during his  hospitalization, he reports no recent visual changes.  HEMATOLOGIC:  He denies any history of clotting disorders or anemia.  SKIN:  He denies any skin rashes.   He reports no known drug allergies.   MEDICATIONS:  He takes carvedilol 6.25 mg 1/2 tablet b.i.d., glimepiride  2 mg 1/4 tablet t.i.d., fluconazole spray 50 mcg 2 sprays daily in each  nostril, loratadine 10 mg daily, Lipitor 40 mg 1/2 tablet daily,  temazepam 30 mg at q.[name].s., benazepril 10 mg daily, hydrocodone/APAP  5/500 mg 2 tablets every 6 hours, omeprazole 20 mg daily, Cartia 240 mg  daily, doxazosin 2 mg daily, furosemide 20 mg daily, aspirin 81 mg  daily, iron 27 mg twice a day, chromium oxalate 400 mcg daily,  multivitamin, Centrum Silver 1 daily, vitamin D 200 IU daily, Slo-Niacin  500 mg daily, Tylenol P.M. 2 at night, BC powder p.r.n., MiraLAX once  daily, fish oil 1200 mg q.i.d.   He underwent 2 vascular studies today.  First he had a lower extremity  bypass graft duplex exam with ABIs.  ABI was stable at 0.71 on the right  and 1.01 on the left.  His previous ABIs in November 2010 were 0.7 on  the right and 0.95 on the left.   His left fem-pop bypass was patent; however, at the proximal vein graft,  there was a velocity of 306.  This was up from his previous velocity of  280.  He also had a carotid duplex which showed right internal carotid  artery stenosis at 60% to 79% stenosis and a left internal carotid  artery of 1% to 39% stenosis with bilateral vertebral artery with  antegrade flow.   Physical exam findings show blood pressure 179/77, heart rate 74,  respirations 20.  General Appearance:  This is an alert, pleasant male  in no acute distress.  He appears approximately his stated age.  His  head is normocephalic, atraumatic.  Pupils are equal.  He is wearing  glasses.  His sclerae are nonicteric.  He does have a mild dysarthria  with a history of cleft palate.  His lung sounds are clear  throughout.  His heart has a regular rate and rhythm.  He does have a right carotid  bruit.  No carotid bruits auscultated on the left.  He has no  significant peripheral edema.  He has 2+ radial pulses and 2+ femoral  pulses.  He had Doppler signals of his bilateral posterior tibial and  anterior tibial arteries and a right peroneal Doppler signal.  His  abdomen is soft, nontender, nondistended.  Neuro exam was nonfocal.  The  patient's strength was strong and symmetrical bilaterally, although he  does  exhibit some generalized weakness overall.  Skin showed no  ulcerations.  There is no cervical lymphadenopathy appreciated.  His  median sternotomy incision is healed well without signs of infection.  No sternal click appreciated.   Patient has continued to do overall well following his innominate artery  and left common carotid artery bypass.  He does continue to see Dr.  Pattricia Boss at [facility] for what sounds like a thoracic aortic stent.  Because he had a left common carotid artery bypass, we did ask for a  carotid duplex scan to be done today, as it appears this has not been  done in the past.  This does show moderate stenosis of the right  internal carotid artery.  He is asymptomatic.  In regards to his left  fem-pop bypass, it remains patent; however, he has had gradual increase  in velocities over the last year and half.  Velocities are now just over  300; however, his symptoms have remained overall stable other than mild  increase in numbness in his left toes.  He has no nonhealing wounds or  rest pain.   I did discuss the case with Dr. Gretta Began.  He favors continued 26-month  followup unless further increase in his velocities.  Will also plan for  57-month follow-up to evaluate his right internal carotid artery  stenosis.  I have asked that at the 72-month follow-up that he be seen by  one of our physicians so if further intervention is needed, they can  discuss that at that  time.  It would also give him a chance to become  reacquainted with one of our other vascular surgeons since Dr. Madilyn Fireman has  relocated.  I also told him to contact us sooner or go to the emergency  department if he has any neurologic changes and to call us if he has  worsening left leg symptoms such as rest pain or new ulcers.   Jerold Coombe, PA   Larina Earthly, M.D.  Electronically Signed   AWZ/MEDQ  D:  08/10/2009  T:  08/10/2009  Job:  [id number]   cc:   Dr. Merlyn Albert A. [name], M.D.

## 2010-08-13 NOTE — Assessment & Plan Note (Signed)
OFFICE VISIT   [name], [name]  DOB:  27-Jul-1930                                       09/12/2009  EXBMW#:[id number]   The patient presented to the office today for further evaluation for a  possible left leg bypass stenosis.  He was seen by our PA  Jerold Coombe on May 13.  At that time he had an ultrasound performed which  showed slowly increasing velocities in the proximal anastomosis of his  left fem-pop bypass.  His left fem-pop was originally performed in 1998  by Dr. Madilyn Fireman.  It was a  vein bypass above the knee.  This was revised  on the proximal anastomosis in 1999.  He currently is experiencing no  claudication symptoms.  He has no symptoms of rest pain.  The evaluation  was purely based on increased velocities on duplex ultrasound.   PAST MEDICAL HISTORY:  Remarkable for previous carotid bypass felt to be  secondary to infected aneurysm.  He has also previously had a right hip  hemiarthroplasty in 2003.  Chronic medical problems include elevated  triglycerides, diabetes, hypertension, chronic back pain, all of which  are controlled by his primary care physician.   FAMILY AND SOCIAL HISTORY:  Unchanged from May 13.   REVIEW OF SYSTEMS:  Also unchanged from May 13.   PHYSICAL EXAMINATION:  VITAL SIGNS:  Blood pressure is 161/68 in the  left arm, heart rate is 80 and regular, respirations 20.  HEENT:  Unremarkable.  NECK:  Has bilateral carotid bruits right greater than left.  CHEST:  Clear to auscultation bilaterally.  CARDIAC:  Regular rate and rhythm without murmur.  ABDOMEN:  Soft, nontender, nondistended.  No masses.  MUSCULOSKELETAL:  Exam has no major obvious joint deformities.  NEUROLOGIC:  Shows symmetric upper extremity and lower extremity motor  strength which is 5/5.  SKIN:  No open ulcers or rashes.  EXTREMITIES:  He has 2+ femoral pulses bilaterally.  He has a 2+  posterior tibial pulse on the left and 1+ dorsalis pedis  pulse.  He has  absent popliteal and pedal pulses on the right.  He has absent popliteal  pulse on the left.   I reviewed his vascular lab study dated Aug 10, 2009.  This showed a  carotid stenosis of 60% to 80% which was stable on the right side.   His ABIs were 1.01 on the left and 0.71 on the right which were also  stable.  However he has had slowly increasing velocities on the proximal  anastomosis of his left bypass which is now 306 cm/sec.   I discussed with the patient today his vascular ultrasound findings and  suggested to him that there be some proximal narrowing of his left  bypass graft.  I discussed with him that an intervention might be  necessary to try to preserve his bypass graft.  However, he has decided  at this point to defer an arteriogram for now or until he becomes more  symptomatic.  In light of this we will reschedule him for a repeat  duplex ultrasound and an office visit with me in 6 months' time.     Janetta Hora. Fields, MD  Electronically Signed   CEF/MEDQ  D:  09/12/2009  T:  09/13/2009  Job:  [id number]   cc:   Loraine Leriche  Tonny Branch, M.D.  Dr. Jamesetta So

## 2010-08-13 NOTE — Procedures (Signed)
BYPASS GRAFT EVALUATION   INDICATION:  Followup evaluation of bypass graft.   HISTORY:  Diabetes:  Yes.  Cardiac:  Innominate and aortic aneurysm repair on 09/11/2000 by Dr.  Laneta Simmers, and second innominate repair in 05/2002 at [facility].  Hypertension:  Yes.  Smoking:  Quit.  Previous Surgery:  Repair of vein graft stenosis and left fem-pop bypass  graft on 08/18/1997 by Dr. Madilyn Fireman.  Right EIA, PTA on 10/23/1995 by [facility].   SINGLE LEVEL ARTERIAL EXAM                               RIGHT              LEFT  Brachial:                    130                137  Anterior tibial:             86                 123  Posterior tibial:            90                 127  Peroneal:  Ankle/brachial index:        0.66               0.93   PREVIOUS ABI:  Date:  08/14/2006  RIGHT:  0.73  LEFT:  >1   LOWER EXTREMITY BYPASS GRAFT DUPLEX EXAM:  See attached sheet for  velocities.   DUPLEX:  Doppler arterial wave forms appear biphasic proximal to within  and distal to bypass graft.   IMPRESSION:  Bilateral ankle-brachial indices appear stable from  previous study.   Patent left femoral-popliteal bypass graft.   ___________________________________________  P. Liliane Bade, M.D.   AS/MEDQ  D:  02/22/2007  T:  02/23/2007  Job:  [id number]

## 2010-08-13 NOTE — Procedures (Signed)
CAROTID DUPLEX EXAM   INDICATION:  PVD.   HISTORY:  Diabetes:  Yes.  Cardiac:  Innominate and aortic arch replacement.  Hypertension:  Yes.  Smoking:  Previous.  Previous Surgery:  No.  CV History:  Nonsymptomatic.  Amaurosis Fugax No, Paresthesias No, Hemiparesis No                                       RIGHT             LEFT  Brachial systolic pressure:         132               147  Brachial Doppler waveforms:         Triphasic         Triphasic  Vertebral direction of flow:        Antegrade         Antegrade  DUPLEX VELOCITIES (cm/sec)  CCA peak systolic                   85                47  ECA peak systolic                   171               38  ICA peak systolic                   205               92  ICA end diastolic                   56                41  PLAQUE MORPHOLOGY:                  Mixed             Mixed  PLAQUE AMOUNT:                      Moderate          Mild  PLAQUE LOCATION:                    ICA, ECA, bulb    ICA, ECA   IMPRESSION:  1. The right internal carotid artery velocity suggests 60%-79%      stenosis.  2. Left internal carotid artery velocity suggests 1%-39% stenosis.  3. Bilateral vertebral arteries suggest antegrade flow.        ___________________________________________  Janetta Hora Fields, MD   NT/MEDQ  D:  08/10/2009  T:  08/10/2009  Job:  [id number]

## 2010-08-13 NOTE — Procedures (Signed)
BYPASS GRAFT EVALUATION   INDICATION:  Followup left fem-pop bypass graft.   HISTORY:  Diabetes:  Yes.  Cardiac:  Innominate and aortic aneurysm repair on 09/12/2000 by Dr.  Laneta Simmers and second innominate repair in March of 2004 at [facility].  Hypertension:  Yes.  Smoking:  Quit.  Previous Surgery:  Repair of vein graft stenosis and left fem-pop bypass  graft on 08/18/1997 by Dr. Madilyn Fireman.  Right external iliac artery PTA on  10/23/1995 by [facility].   SINGLE LEVEL ARTERIAL EXAM                               RIGHT              LEFT  Brachial:                    153                152  Anterior tibial:             94                 134  Posterior tibial:            99                 133  Peroneal:  Ankle/brachial index:        0.65               0.88   PREVIOUS ABI:  Date:  02/22/2007  RIGHT:  0.66  LEFT:  0.93   LOWER EXTREMITY BYPASS GRAFT DUPLEX EXAM:   DUPLEX:  Biphasic waveforms noted throughout the left lower extremity  bypass graft and its native vessels with no evidence of significantly  increased velocities noted.  Moderate calcific plaque formation noted near the proximal anastomosis  with no focal increase in velocities observed.   IMPRESSION:  1. Patent left fem-pop bypass graft with no evidence of significant      stenosis.  2. Bilateral ankle brachial indices remain unchanged from a previous      exam.   ___________________________________________  P. Liliane Bade, M.D.   CH/MEDQ  D:  08/19/2007  T:  08/19/2007  Job:  [id number]

## 2010-08-13 NOTE — Procedures (Signed)
BYPASS GRAFT EVALUATION   INDICATION:  Followup evaluation of lower extremity bypass graft.   HISTORY:  Diabetes:  Yes.  Cardiac:  Innominate artery and aortic arch replacement.  Hypertension:  Yes.  Smoking:  Former smoker.  Previous Surgery:  Repair of in graft stenosis and left fem-pop bypass  graft on 08/18/1997 by Dr. Madilyn Fireman.  Right external iliac artery PTA on  10/23/1995.   SINGLE LEVEL ARTERIAL EXAM                               RIGHT              LEFT  Brachial:                    143                151  Anterior tibial:             104                112  Posterior tibial:            91                 135  Peroneal:  Ankle/brachial index:        0.69               0.89   PREVIOUS ABI:  Date:  08/19/2007  RIGHT:  0.65  LEFT:  0.88   LOWER EXTREMITY BYPASS GRAFT DUPLEX EXAM:   DUPLEX:  Doppler arterial waveforms are biphasic proximal to, within and  distal to the left fem-pop bypass graft.  Elevated peak velocity of 222 cm/second in the left common femoral  artery which is stable compared to previous study.  Right external iliac artery is biphasic with no evidence of stenosis.   IMPRESSION:  1. ABIs are stable from previous study bilaterally.  2. Patent left fem-pop bypass graft.      ___________________________________________  P. Liliane Bade, M.D.   MC/MEDQ  D:  02/03/2008  T:  02/03/2008  Job:  [id number]

## 2010-08-13 NOTE — Procedures (Signed)
BYPASS GRAFT EVALUATION   INDICATION:  Left fem-pop bypass graft.   HISTORY:  Diabetes:  Yes.  Cardiac:  Innominate artery and aortic arch replacement.  Hypertension:  Yes.  Smoking:  Previous.  Previous Surgery:  Repair of stenosis in the left fem-pop bypass graft  in 1999, right external iliac artery PTA in 1997.   SINGLE LEVEL ARTERIAL EXAM                               RIGHT              LEFT  Brachial:                    139                148  Anterior tibial:             95                 140  Posterior tibial:            94                 Not detected  Peroneal:                                       140  Ankle/brachial index:        0.64               0.95   PREVIOUS ABI:  Date:  02/03/2008  RIGHT:  0.69  LEFT:  0.89   LOWER EXTREMITY BYPASS GRAFT DUPLEX EXAM:   DUPLEX:  Biphasic Doppler waveforms noted throughout the left lower  extremity bypass graft and its native vessels with an increased velocity  of 254 cm/s noted at the proximal anastomosis level.   IMPRESSION:  1. Patent left fem-pop bypass graft with an increased velocity noted,      as described above.  2. Stable bilateral ankle brachial indices noted.       ___________________________________________  P. Liliane Bade, M.D.   CH/MEDQ  D:  08/10/2008  T:  08/10/2008  Job:  [id number]

## 2010-08-16 NOTE — Op Note (Signed)
[place]. [facility]  Patient:    [name], [name]                      MRN: [id number] Proc. Date: 07/24/00 Adm. Date:  [id number] Attending:  Rodrigo Ran A                           Operative Report  PREOPERATIVE DIAGNOSIS:  Bilateral hand abscesses.  POSTOPERATIVE DIAGNOSIS:  Bilateral hand abscesses.  PROCEDURE:  Bilateral hand I&D for complex wound abscesses.  SURGEON:  Artist Pais. Mina Marble, M.D.  ASSISTANT:  None.  TOURNIQUET TIME:  15 minutes left, 14 minutes right.  ANESTHESIA:  General.  COMPLICATIONS:  None.  DRAINS:  No drains.  Wounds packed up and cultures x 2 sent.  DESCRIPTION OF PROCEDURE:  The patient was taken to the operating room after induction of adequate general anesthesia.  The right and left upper joint was prepped and draped in the usual sterile fashion.  The left hand was elevated for five minutes.  The tourniquet was then inflated and two incisions were made, one in the web space between the index and thumb, and one between the third and fourth metacarpals of approximately 4-5 cm in length.  Copious amounts of purulent material were encountered upon incising the skin.  The purulent material was in the soft tissues only and did not extend into the interosseous muscle area and appeared to be all in the subcutaneous plane. This was cultured x 2.  The wound was then thoroughly irrigated using 6 L of normal saline.  The same procedure was repeated on the right side, using one incision in the web space between the index and thumb and again irrigated with 6 L.  The wounds were then packed open with Betadine-soaked Kerlix, 4 x 4s, fluffs, a compressive dressings, and Coban.  The patient tolerated the procedure well and went to recovery in stable fashion. DD:  07/24/00 TD:  07/26/00 Job: [id number]

## 2010-08-16 NOTE — Op Note (Signed)
[place]. [facility]  Patient:    [name], [name] Visit Number: [id number] MRN: [id number]          Service Type: FTC Location: FOOT Attending Physician:  Nadara Mustard Dictated by:   Artist Pais Mina Marble, M.D. Proc. Date: 08/14/00 Admit Date:  07/13/2001 Discharge Date: 07/19/2001                             Operative Report  PREOPERATIVE DIAGNOSIS:  Right shoulder septic glenohumeral joint.  POSTOPERATIVE DIAGNOSIS:  Right shoulder septic glenohumeral joint.  PROCEDURES:  Right shoulder arthroscopy with debridement and cultures x 2 of soft tissue and bone.  SURGEON:  Artist Pais. Mina Marble, M.D.  ASSISTANT:  Junius Roads. Ireton, P.A.C.  ANESTHESIA:  General.  COMPLICATIONS:  None.  DRAINS:  None.  DESCRIPTION OF PROCEDURE:  The patient was taken to the operating room.  After the induction of adequate general anesthesia, he was placed in the beach chair position and prepped and draped with the right shoulder out.  The right shoulder was then arthroscopically entered using standard posterior portal. The glenohumeral joint had significant cloudy fluid which was cultured.  A bone biopsy was also taken of the humeral head as the patient had underlying MRI that showed osteomyelitis possibly of the humeral head.  The joint was debrided of all infectious material and significant fraying of the labrum and the joint capsule.  The joint was also copiously irrigated with normal saline through the inflow and outflow portals for 3 L.  The portals were then loosely closed with nylon and a sterile dressing of Xeroform, 4 x 4s, fluffs, and a compression shoulder dressing was applied.  The patient tolerated the procedure well and went to recovery in stable fashion. Dictated by:   Artist Pais Mina Marble, M.D. Attending Physician:  Nadara Mustard DD:  07/26/01 TD:  07/26/01 Job: [id number]

## 2010-08-16 NOTE — H&P (Signed)
[name], [name]                           ACCOUNT NO.:  [id number]   MEDICAL RECORD NO.:  [id number]                    PATIENT TYPE:   LOCATION:                                       FACILITY:  [facility]   PHYSICIAN:  Ollen Gross, M.D.                 DATE OF BIRTH:   DATE OF ADMISSION:  01/28/2002  DATE OF DISCHARGE:                                HISTORY & PHYSICAL   CHIEF COMPLAINT:  Right groin pain.   HISTORY OF PRESENT ILLNESS:  The patient is a 77 year old male who had acute  onset of right groin pain about two weeks ago.  The patient noticed that he  fell from his wheelchair.  Does not rightly know how he fell and if he  landed on his hip but since that time he has had an increased pain in his  right groin.  He has been nonambulatory.  Prior to the incident the patient  was walking, full weightbearing on bilateral extremities with a walker.  The  patient came in to our clinic today to be seen by Dr. Ethelene Hal for his low back  pain.  At that time it was noticed the patient had right hip pain.  X-rays  were taken which revealed a right femoral neck fracture which was completely  displaced.  It is felt at this time the patient would benefit from a  hemiarthroplasty of the right hip.  The risks and benefits were discussed  with patient by Dr. Ollen Gross.  The patient will be admitted to [facility] and undergo the above stated surgical procedure tomorrow.   PAST MEDICAL HISTORY:  1. Significant for diabetes which is diet controlled.  2. History of tachycardia.  3. History of chronic pain.  4. History of aortic aneurysm.   PAST SURGICAL HISTORY:  1. Stent placed in his aorta in 2003.  2. Lumbar decompression performed in 2002.  Following that he had a Staph     infection which when he became septic he had multiple surgeries including     bilateral hands and right shoulder.   CURRENT MEDICATIONS:  The patient did not have his list of medications in  the office  today.  He will bring those medications with him to the hospital  at time of admission.   ALLERGIES:  No known drug allergies.   SOCIAL HISTORY:  This is a 77 year old gentleman who has a care giver that  stays with him during the day and his son takes care of him at night.  He  lives in a [place] home.  He states he does not smoke, but smoked  previously for 50 years.  Denies any alcohol use.   FAMILY HISTORY:  Significant for diabetes, hypertension, heart disease.   REVIEW OF SYSTEMS:  GENERAL:  No fevers, chills, night sweats, bleeding  tendencies.  CNS:  No blurred or  double vision, seizure, headache, or  paralysis.  RESPIRATORY:  No shortness of breath, productive cough, or  hemoptysis.  CARDIOVASCULAR:  No chest pain, angina, or orthopnea.  GASTROINTESTINAL:  No nausea, vomiting, diarrhea, constipation, melena.  GENITOURINARY:  No dysuria, hematuria, or discharge.  MUSCULOSKELETAL:  Pertinent to HPI.   PHYSICAL EXAMINATION:  VITAL SIGNS:  Pulse 80, respiratory 16, blood  pressure 140/86.  GENERAL:  This is a well-developed, well-nourished 77 year old male in no  acute distress.  At the current time he is seated in a wheelchair and has  mild slurred speech.  HEENT:  Atraumatic, normocephalic.  Pupils are equal, round, and reactive to  light.  EOMs intact.  NECK:  Supple.  No lymphadenopathy.  CHEST:  Clear to auscultation bilaterally.  No rhonchi, wheezes, or rales.  HEART:  Regular rate and rhythm without murmurs, gallops, or rubs.  ABDOMEN:  Soft, nontender, nondistended.  Bowel sounds x4.  BREASTS:  Not done.  Not pertinent to HPI.  GENITOURINARY:  Not examined.  Not pertinent to HPI.  SKIN:  There are multiple well healed scars due to his previous Staph  infections.  These are evidenced on both hands and the right shoulder.  EXTREMITIES:  The patient is seated in his wheelchair with his legs in  external rotation.  NEUROLOGIC:  Intact.  Pulses are equal bilaterally.   Calves are soft,  nontender.   LABORATORIES:  X-rays revealed a displaced right femoral neck fracture.   IMPRESSION:  1. Right femoral neck fracture that is displaced.  2. Diabetes.  3. Tachycardia.  4. Chronic pain.  5. Aortic aneurysm.   PLAN:  The patient will be admitted to [facility] on January 28, 2002.  The patient will undergo a right hip hemiarthroplasty by Dr. Ollen Gross.  Dr. Waynard Edwards is the patient's primary care physician.  His group was  notified of the patient's admission.  Dr. Timothy Lasso will provide medical  clearance for the surgical procedure tomorrow.     Roma Schanz, P.A.                   Ollen Gross, M.D.    CS/MEDQ  D:  01/28/2002  T:  01/28/2002  Job:  [id number]

## 2010-08-16 NOTE — Discharge Summary (Signed)
[place]. [facility]  Patient:    [name], [name]                      MRN: [id number] Adm. Date:  [id number] Disc. Date: [id number] Attending:  Ezequiel Kayser Dictator:   Pilar Grammes, P.A.                           Discharge Summary  ADDENDUM  DATE OF BIRTH:  26-Mar-1931  This patient was initially anticipated for discharge to rehabilitation subacute care unit on September 25, 2000.  Nevertheless, the patient did not have an accepting physician at that time due to Dr. Waynard Edwards being out of town.  The patient was then kept on 2000 over the weekend and he was transferred to [facility] on Monday.  His course over the weekend was stable.  He did not have any hemodynamic complications and did not have any respiratory or cardiac complications.  His medications were the same as the previous discharge summary as well as his discharge diagnoses were unchanged.  He was transferred to [facility] on September 28, 2000, in stable condition in the care of Dr. Waynard Edwards. DD:  11/03/00 TD:  11/05/00 Job: [id number]

## 2010-08-16 NOTE — Discharge Summary (Signed)
NAMEMARSHAWN, [name]                         ACCOUNT NO.:  [id number]   MEDICAL RECORD NO.:  [id number]                   PATIENT TYPE:  IPS   LOCATION:  4149                                 FACILITY:  [facility]   PHYSICIAN:  Ranelle Oyster, M.D.             DATE OF BIRTH:  1930-10-01   DATE OF ADMISSION:  02/01/2002  DATE OF DISCHARGE:  02/07/2002                                 DISCHARGE SUMMARY   DISCHARGE DIAGNOSES:  1. Right hip hemiarthroplasty for fracture.  2. Diabetes mellitus.  3. Hypertension.  4. Postoperative anemia.  5. Hypokalemia, resolved.   HISTORY OF PRESENT ILLNESS:  [name] is a 77 year old male with history of  peripheral vascular disease, diabetes mellitus, hypertension, severe groin  pain in the right since 10/13 with decreasing ambulation and required  wheelchair use. He was evaluated by Dr. Lequita Halt and noted to have  nondisplaced right femoral neck fracture, admitted to [facility]  10/31 from office. The patient underwent right hip hemiarthroplasty 11/01  and postoperative his pain has been tolerated with Coumadin being initiated  for DVT prophylaxis. He required 2 units of packed red blood cells for  postoperative anemia. He has had problems with nausea and vomiting the past  24 hours with some hypokalemia as well as hiccups and curative therapy  initiated. The patient is mod assist for transfers, mod assist for  ambulating 10 feet with a rolling walker.   PAST MEDICAL HISTORY:  1. Significant for DM type 2.  2. History of sinusitis with sinus surgery.  3. Anemia of chronic disease.  4. BPH.  5. Peripheral vascular disease.  6. Lumbar stenosis with LAM and abscess with multiple complications.   ALLERGIES:  CODEINE.   SOCIAL HISTORY:  The patient is widowed, retired from [employer]. He  lives in a one-level home with ramp entry and has been wheelchair bound for  the past 2-3 weeks. He does not use any alcohol or tobacco.   HOSPITAL COURSE:  [name] was admitted to rehab on 02/01/02 for  physical therapy to consist of PT and OT daily. The past admission, he had  been treated on Coumadin for DVT prophylaxis. CBGs were done on a.c./h.s.  basis, and the patient was maintained on Amaryl 1 mg a day. Initial blood  sugars were well controlled as p.o. intake improved. Currently blood sugars  ranging from 140s to an occasional high in mid 200 at bedtime. The patient  is encouraged to continue taking Amaryl 1 mg q.d. The patient reports using  Amaryl p.r.n. at home. The patient and family have been advised regarding  monitoring blood sugars on b.i.d. basis for now and following up with LMD  for further adjustment of medication.   [name] had some issues with dysphagia with question of reflux and was  started on Protonix b.i.d.; Magic mouthwash was also ordered with  improvement in his oral  pain. His left hip incision has healed well without  any signs or symptoms of infection. No drainage or erythema noted. Once  physical therapy was initiated, the patient has made good progress.  Currently, he is at distant supervision for bed mobility secondary to safety  and total hip precautions. He is distant supervision for transfers, modified  independent for ambulating 150 feet with rolling walker. He was assistance  for setup for upper body care, minimal assistance for lower body care  secondary to hip precautions. He does continue to require for continual  cuing to maintain hip precautions. Further followup therapies to include  home health PT and OT per [facility] services. Home health nurse  should draw pro time next on Wednesday, 02/16/02. On 02/07/02, the patient  is discharged to home.   DISCHARGE MEDICATIONS:  1. Coumadin 1 mg p.o. q.d.  2. Elavil 25 mg q.h.s.  3. Cardizem CD 180 mg  a day.  4. Neurontin 300 mg q.h.s.  5. Amaryl 1 mg per day.  6. Protonix 40 mg a day.  7. Flomax 0.4 mg  q.h.s.  8. K-Dur 20 mEq per day.  9. Macrobid 100 mg b.i.d.  10.      Oxycodone 5 to 10 mg p.o. q.4-6h. p.r.n. pain.   ACTIVITY:  Use walker. Follow hip precautions.   DIET:  Drink plenty of fluids. Check blood sugars b.i.d. and record.   WOUND CARE:  Keep area clean and dry.   SPECIAL INSTRUCTIONS:  No alcohol and no driving. Do not use triamterene  hydrochlorothiazide currently.   FOLLOW UP:  He is to follow with Dr. Sherlean Foot for postoperative check in the  next 7 to 10 days. Follow up with Dr. Waynard Edwards in two weeks for check on  diabetes as well as blood pressure. Followup with Dr. Riley Kill as needed.     Dian Situ, P.A.          Ranelle Oyster, M.D.    PP/MEDQ  D:  02/07/2002  T:  02/08/2002  Job:  [id number]   cc:   Ollen Gross, M.D.  [address]  [place]  [place] [address]  Fax: [phone]   Loraine Leriche A. [name], M.D.

## 2010-08-16 NOTE — Discharge Summary (Signed)
NAMEEMAN, MORIMOTO                         ACCOUNT NO.:  [id number]   MEDICAL RECORD NO.:  [id number]                   PATIENT TYPE:  INP   LOCATION:  0478                                 FACILITY:  [facility]   PHYSICIAN:  Ollen Gross, M.D.                 DATE OF BIRTH:  04-Mar-1931   DATE OF ADMISSION:  01/28/2002  DATE OF DISCHARGE:  02/01/2002                                 DISCHARGE SUMMARY   ADMISSION DIAGNOSES:  1. Right femoral neck fracture, displaced.  2. Diabetes mellitus.  3. Tachycardia.  4. Chronic pain.  5. Aortic aneurysm.  6. Cleft palate.  7. Recurrent sinusitis.  8. Peripheral vascular disease.  9. Hypertriglyceridemia.  10.      Spinal stenosis L2 to L5.  11.      History of significant methicillin-resistant Staphylococcus aureus     sepsis April 2002.  12.      Benign prostatic hypertrophy.  13.      Hypertension.  14.      Chronic anemia.   DISCHARGE DIAGNOSIS:  1. Displaced right femoral neck fracture, status post right hip     hemiarthroplasty.  2. Postoperative hypokalemia, improved.  3. Postoperative blood loss anemia.  4. Status post transfusion without sequelae.  5. Diabetes mellitus.  6. Tachycardia.  7. Chronic pain.  8. Aortic aneurysm.  9. Cleft palate.  10.      Recurrent sinusitis.  11.      Peripheral vascular disease.  12.      Hypertriglyceridemia.  13.      Spinal stenosis L2 to L5.  14.      History of significant methicillin-resistant Staphylococcus aureus     sepsis April 2002.  15.      Benign prostatic hypertrophy.  16.      Hypertension.  17.      Chronic anemia.   PROCEDURE:  The patient was taken to the OR on January 29, 2002, and  underwent a right hip hemiarthroplasty.  Surgeon:  Dr. Homero Fellers Aluisio.  Assistant:  Avel Peace, P.A.C.  Surgery under general anesthesia.  Hemovac  drain x 1.  Estimated blood loss 750 cc.   CONSULTATIONS:  1. Medical, Dr. Rodrigo Ran and Dr. Creola Corn.  2. Rehabilitation services,  Dr. Ellwood Dense.   BRIEF HISTORY:  The patient is a 77 year old male who had the acute onset of  right groin pain about two weeks ago.  The patient noticed that he fell from  his wheelchair, does not know how he fell, and landed onto his hip.  He has  had increased pain in the right hip since that time.  He has been  nonambulatory.  Prior to the incident the patient was walking full  weightbearing.  He was seen in the clinic today by Dr. Ethelene Hal for low back  pain.  It was noticed the patient also had right hip pain.  X-rays were  taken and revealed a right femoral neck fracture which was completely  displaced.  Dr. Homero Fellers Aluisio was on call for the group over the weekend.  The patient was discussed with Dr. Lequita Halt, and the patient was admitted for  surgical intervention.   LABORATORY DATA:  CBC on admission showed hemoglobin 11.2, hematocrit 32.4,  white cell count 4.7, red cell count 3.63, differential within normal  limits.  Postoperative H&H 10.4 and 29.9.  Hemoglobin and hematocrit  continued to decline down to 8.2 and 23.1.  This was noted on January 31, 2002.  Given two units of blood.  Posttransfusion hemoglobin and hematocrit  10.1 and 28.9.  PT and PTT on admission were 14.0 and 27 respectively, with  an INR of 1.1.  Serial protimes were followed per Coumadin protocol.  PT and  INR did elevate to 41.3 and 6.1.  Chemistry panel on admission:  Elevated  glucose of 197, BUN elevated at 28, calcium low at 8.1.  ALP elevated at  315.  Serial BMETs were followed.  BUN did come down a little, from 28 down  to 24.  Calcium came back up to 9.3.  Glucose was down to 139 and back up to  229 and was back down to 164.  Potassium dropped postoperatively from 3.8  down to 3.2.  Started on potassium supplements, and potassium came back up  to 3.3.  Urinalysis positive protein, few epithelial cells, otherwise  negative.  Blood group type AB positive.   EKG dated January 28, 2002:  Sinus  rhythm.  V2 defective.  Prolonged QT has  no significant change since last tracing of September 12, 2000, confirmed by Dr.  Aggie Cosier.   Chest x-ray dated January 28, 2002:  No active lung disease.  Pelvis and hip  film taken on January 29, 2002:  A right hip replacement has been performed.  No apparent paraprosthetic fractures.  Femoral and acetabular components,  satisfactory appearance.   HOSPITAL COURSE:  The patient was admitted to [facility], placed  at bed rest.  Medical consult was called in.  The patient's medical  physician is Dr. Waynard Edwards.  The patient was seen in consultation by Dr. Timothy Lasso,  who was covering for Dr. Waynard Edwards.  The patient does have extensive medical  history as documented above.  He was seen preoperatively by Dr. Timothy Lasso, who  stated the patient was stable to proceed with surgery.  Laboratories were  ordered preoperatively.  Sliding scale insulin was ordered for his diabetes,  and the patient was preoperative for the following day.  The next morning,  on January 29, 2002, the patient was taken to the operating room and  underwent the above-stated procedure without complication.  The patient  tolerated the procedure well.  Hemovac drain placed at the time of surgery  was pulled on postoperative day #1 without difficulty.  The patient was  placed on PCA and IV analgesia for pain control following surgery, later  weaned over to p.o. medications.  He was placed weightbearing as tolerated.  Laboratories ordered preoperatively were followed very closely.  The patient  was given 24 hours of postoperative IV antibiotics.  H&Hs were followed very  closely.  It was noted the patient's hemoglobin had dropped by postoperative  day #2 down to 8.2.  He was given two units of blood.  Posttransfusion  hemoglobin was back above 10.  It was felt the patient may be slow to progress with physical therapy.  Therefore, a rehabilitation consult was  called.  The patient was seen in  consultation by Dr. Ellwood Dense on  January 31, 2002, and felt he would be appropriate for [facility]  [facility].  It was decided he would be transferred over when a bed was  available and when the patient was stable.  It was also noted that the  patient's potassium did drop a little bit following surgery.  He was placed  on potassium supplements.  The patient was weaned from [facility] analgesics over to  p.o. analgesics.  As the patient had received blood, the PCA and IVs were  discontinued.  The patient did have some elevated temperature on  postoperative day #2 of 102.3.  This was treated with antipyretics,  incentive spirometer.  The patient did tolerate the blood well.  He was  later found to have a hemoglobin back up to 10.1.  The patient's pain had  decreased, and he was tolerating p.o. medications.  He was slowly  progressing with physical therapy.  PT and OT were ordered postoperatively.  It was noted later that day that a bed became available on the  rehabilitation unit.  The patient was stable with the exception of an  elevated INR.  The bed was available.  It was felt that he would benefit  from undergoing rehabilitation.  Therefore, the patient was transferred over  to [facility] on February 01, 2002.   DISCHARGE PLAN:  1. The patient was transferred over to [facility] on February 01, 2002.  2. Discharge diagnoses:  Please see above.  3. Discharge medications:     A. Coumadin as per pharmacy protocol.     B. Continue his home medications.     C. He was also placed on potassium chloride 40 mEq, a one-time dose on        January 31, 2002, before his discharge.     D. Tylenol 1 or 2 every four to six hours as needed for pain.     E. Percocet 5 mg 1 or 2 every four to six hours as needed for moderate        pain.     F. Robaxin 500 mg p.o. q.6h. p.r.n. spasm.  4. Diet:  Low-sodium, diabetic diet.  5. Activity:  Weightbearing as tolerated  to the right lower extremity.     Continue gait training ambulation and precautions as per PT and OT for     ADLs while on the rehabilitation service.   FOLLOW-UP:  The patient is to follow up in the office with Dr. Lequita Halt two  weeks from surgery.  Call the office for an appointment, [phone], or have  him follow up after discharge from the rehabilitation unit.   DISPOSITION:  [facility].   CONDITION ON DISCHARGE:  Improved.      Alexzandrew L. Julien Girt, P.A.              Ollen Gross, M.D.    ALP/MEDQ  D:  03/02/2002  T:  03/02/2002  Job:  [id number]   cc:   Loraine Leriche A. Waynard Edwards, M.D.  [address]  [place]  [place] [address]  Fax: [phone]   Gwen Pounds, M.D.  [address]  [place]  [place] [address]  Fax: [phone]

## 2010-08-16 NOTE — Consult Note (Signed)
[place]  Patient:    [name], [name]                      MRN: [id number] Proc. Date: 09/03/00 Adm. Date:  [id number] Attending:  Rodrigo Ran A                          Consultation Report  REFERRING:  Burnice Logan, M.D.  REASON FOR CONSULTATION:  Mycotic thoracic aortic aneurysms.  HISTORY OF PRESENT ILLNESS:  This patient is a 77 year old gentleman who underwent surgery for lumbar spinal stenosis a couple months ago and subsequently was readmitted with worsening low-back pain, as well as multiple swollen, warm, red joints.  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  He has had recurrent positive blood cultures on intravenous antibiotics, although the last set had been negative on a three-drug regimen including vancomycin, rifampin, and Synercid. He has required incision and drainage of both wrists, as well as the right shoulder, for abscesses in these areas.  The patient underwent a TEE yesterday to rule out any valvular abnormality.  This did not show any valvular abnormality and did show normal left ventricular function.  There was no evidence of valvular vegetation.  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  The aortic wall around these aneurysms is grossly abnormal for a few centimeters.  There appeared to be occlusion of the innominate artery by this aneurysm.  There was also a question of some compression of the origin of the left common carotid artery from this aneurysm.  At the present time,  the patient only complains of pain at his left leg, which is felt to be due to the psoas abscess.  REVIEW OF SYSTEMS:  CONSTITUTIONAL:  He denies any chills at the present time. He has lost some weight.  EYES:  He denies any visual changes or loss of vision.  ENT:  Negative.  ENDOCRINE:  He denies diabetes and hypothyroidism. CARDIOVASCULAR:  He has had no history of chest pain or shortness of breath. He denies any pressure sensation in his chest.  He has had no palpitations. He denies PND and orthopnea.  RESPIRATORY:  He denies cough or sputum production.  GI:  He has had no nausea or vomiting.  He has had no dysphagia. He denies melena and bright red blood per rectum.  GU:  He denies dysuria and hematuria.  NEUROLOGIC:  He has had no weakness or numbness.  He has no dizziness or syncope.  He denies headaches and seizures.  PSYCHIATRIC: Negative.   ALLERGIES:  CODEINE.  MUSCULOSKELETAL:  He complains of pain in his lower back, as well as in the left leg.  PAST MEDICAL HISTORY:  1. History of cleft palate with recurrent sinusitis, status post sinus     surgery in 1998.  2. History of peripheral vascular disease, status post left lower extremity     femoral-popliteal bypass and right lower extremity angioplasty by     Dr. Madilyn Fireman in the past.  3. Hypertriglyceridemia.  4. Status post surgery for L2-5 spinal stenosis.  MEDICATIONS:  1. Claritin 10 mg q.d.  2. Lopid 600 mg b.i.d.  3. Lovenox 40 mg q.d.  4. Protonix 40 mg q.d.  5. Oxycodone p.r.n. for pain.  6. Magic Mouthwash q.i.d.  7. Cardizem 30 mg q.12h.  8. Restoril q.h.s. p.r.n.  9. Humalog insulin 7 units subcu t.i.d. 10. Rifampin 300 mg b.i.d. 11. Potassium chloride 20 mEq q.i.d. 12. NPH insulin 19 units q.a.m. and p.m. 13. Synercid 500 mg IV q.8h. 14. Vancomycin IV per pharmacy protocol.  SOCIAL HISTORY:  He is widowed since 31.  He has two children.  He has a 50+ pack year smoking history and continues to smoke one  pack of cigarettes per day prior to hospitalization.  He denies alcohol or drug abuse.  FAMILY HISTORY:  His father died at age 87 of congestive heart failure.  His mother died of myocardial infarction.  He has a sister with diabetes.  He has a son with Marfans syndrome who has had an AAA repaired.  PHYSICAL EXAMINATION:  VITAL SIGNS:  He is afebrile.  His blood pressure is 112/78 and his pulse is 79 and regular.  Respiratory rate is 20 and unlabored.  GENERAL:  He is a thin, ill-appearing white male in no distress.  HEENT:  Normocephalic and atraumatic.  The pupils are equal and reactive to light and accommodation.  Extraocular muscles are intact.  Throat exam reveals a cleft palate.  NECK:  Prominent pulsation in the right side of the neck.  This is more prominent than over the left side of the neck and I am not sure whether this is the carotid artery or the palpable pseudoaneurysm.  There are no bruits. There is no adenopathy or thyromegaly.  There are no masses in the neck.  CARDIAC:  Regular rate and rhythm with a grade 1-2/6 systolic murmur along the left sternal border.  LUNGS:  Clear.  ABDOMEN:  Active bowel sounds.  His abdomen is soft and nontender.  There are no palpable masses or organomegaly.  EXTREMITIES:  No edema.  His right brachial and radial and ulnar pulses are absent.  There is a strong left brachial, radial, and ulnar pulse.  Femoral pulses are equal bilaterally.  There is an old left groin and medial knee incision from his femoral-popliteal bypass.  There is a palpable left popliteal pulse.  There is an absent right popliteal pulse.  The left posterior tibial and dorsalis pedis are diminished but palpable.  There are no palpable pulses in the right foot.  There are healing wounds on the dorsum of both hands.  There is moderate edema of both hands.  NEUROLOGIC:  Alert and oriented x 3.  Motor and sensory exams appear grossly normal.  SKIN:  Warm and  dry.  LABORATORY DATA:  Normal electrolytes with a BUN of 8 and a creatinine of 0.6  Glucose is 129.  His white blood cell count was 7.0 on May 31.  Hemoglobin was 11.3 and hematocrit of 33.5.  His albumin has been less than 1.5 throughout his hospitalization.  Liver function profile on Aug 16, 2000 showed an elevated ALT of 74 and an elevated alkaline phosphatase of 147.  Total bilirubin was 0.5.  His chest x-ray has shown no active cardiopulmonary disease. Electrocardiogram showed normal sinus rhythm with anterior ST-T abnormality.  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  One of these involves  the takeoff of the innominate artery and the proximal portion of the innominate artery and there may be complete occlusion of the innominate artery beyond this.  The other involved the descending thoracic aorta at the level of the diaphragm.  The aorta around these aneurysms is very abnormal appearing and I suspect it is involved in the infectious/inflammatory process and is likely to be very friable and may be infected.  These aneurysms will likely require surgical treatment to prevent complications of rupture and hemorrhage, as well as to eradicate a potential source of ongoing infection.  These aneurysms involve two different areas. The one involving the innominate artery would require a median sternotomy incision extending up into the neck and, depending on how extensive it is, may require cardiopulmonary bypass and potentially hypothermic circulatory arrest. The other aneurysm would require a left thoracotomy and possible partial left atrial to femoral bypass.  Both of these operations are quite complicated and would have high morbidity and mortality, especially in a patient as debilitated as this with a very poor nutritional status.  These operations could not be performed at the same time and would have to be staged.  I think he  would have a very difficult time recovering from either one of them, let alone both of them.  It is also possible that prosthetic material that is used to replace the involved aorta may subsequently become infected, developing new pseudoaneurysms.  It is also possible that he may have other areas of persistent infection and possibly other pseudoaneurysms in other locations in his body that have not been discovered yet.  He also has significant risk factors for coronary artery disease and an abnormal electrocardiogram and may require a cardiac workup prior to proceeding with this type of surgery.  I will review his case with my colleagues and try to decide on the best course of action for this patient.  I did discuss the disease process in detail with him including possible need for surgery and the very high risk and guarded prognosis for his recovery from this. DD:  09/03/00 TD:  09/04/00 Job: [id number]

## 2010-08-16 NOTE — Op Note (Signed)
[place]. [facility]  Patient:    [name], [name]                      MRN: [id number] Proc. Date: 09/11/00 Adm. 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  OPERATION PERFORMED:  Resection of innominate artery aneurysm and repair of the aortic arch using a rifampin-soaked polyester gelatin graft patch using deep hypothermic circulatory arrest.  SURGEON:  Alleen Borne, M.D.  ASSISTANT:  Denman George, M.D.  ANESTHESIA:  General endotracheal.  CLINICAL HISTORY:  This patient is a 77 year old gentleman who underwent surgery for lumbar spinal stenosis several months ago and subsequently was readmitted with back pain and multiple swollen red joints. He was subsequently diagnosed with MRSA bacteremia and seating at multiple sites including both wrists, the right elbow and shoulder, and the psoas muscles bilaterally. He had recurrent positive blood cultures on intravenous antibiotics although his most recent sets have been negative on a three drug regimen including vancomycin, rifampin, and Synercid. He has required incision and drainage of both wrists as well as the right shoulder. Transesophageal electrocardiogram was done to rule out a valvular abnormality. This did not show any valvular abnormality but did show a penetrating ulcer in the descending thoracic aorta. This patient underwent an MRI of the chest and abdomen which showed probable mycotic aneurysm involving the origin and proximal portion of the innominate artery with a second aneurysm in the descending thoracic  aorta at the diaphragmatic level. The aortic wall around these aneurysms was grossly abnormal for several centimeters. There was occlusion of the innominate artery by the aneurysm and also some question of compression of the large left common carotid artery. The patient subsequently underwent an aortogram to better define the anatomy. This confirmed the aneurysm of the proximal innominate artery which was felt to be a mycotic pseudoaneurysm. There was complete occlusion in the innominate artery just beyond the aneurysm. The right common carotid and right subclavian filled from retrograde flow down the right vertebral. In addition, there was a high grade ostial stenosis in the origin of the left common carotid artery off of the aorta. After review of these studies and examination of the patient, it was felt that the best treatment was to perform resection of the innominate artery aneurysm with revascularization of the innominate artery and left common carotid artery through a median sternotomy incision. Given his marginal cerebral blood flow, Dr. Madilyn Fireman and I felt that this would be the best course of treatment to proceed with first. The plan was to reevaluate treatment of the descending thoracic aneurysm after he recovered from this surgery. I discussed the operative procedure with the patient and his son and daughter including alternatives, benefits, and risks including but not limited to bleeding, possible blood transfusion, infection, stroke, recurrent infection, myocardial infarction and death. They understood and agreed to proceed with surgery.  DESCRIPTION OF PROCEDURE:  The patient was taken to the operating room and placed on the table in the supine position. After induction of general endotracheal anesthesia,  a Foley catheter was placed in the bladder using sterile technique. Then the neck, chest, abdomen and both lower extremities were prepped and draped in the usual sterile  manner. The chest was entered through a median sternotomy incision and this was extended up into the right neck. The pericardium was opened in the midline. There was old pericarditis present with the pericardial space obliterated by adhesions. These adhesions were divided to expose the ascending aorta. The large pseudoaneurysm was noted in the proximal innominate artery. There was a large amount of inflammation around this area. The innominate vein was firmly adhesed to the aneurysm and was displaced forward with significant compression.  After further dissection to expose the extent of the aneurysm, it was felt that this may involve the aorta surrounding the origin of the innominate artery. We did not feel that it would be safe to try to control this area with a partial occlusion clamp. Therefore plans were made to place the patient on cardiopulmonary bypass and to perform the procedure under hypothermic circulatory arrest. Prior to doing this, we decided to place the proximal end of the Y graft onto the right lateral surface of the aorta and to to the distal anastomosis of one of the limbs of the Y graft to the left common carotid artery to maximize the cerebral blood flow prior to circulatory arrest. This was performed and will be dictated separately by Dr. Madilyn Fireman. After completion of this part of the procedure, the patient was then heparinized and when and an adequate active clotting time was achieved, the mid portion of the ascending aorta was cannulated using a 20 French aortic cannula for arterial inflow. Venous outflow was achieved using a two-stage venous cannula to the right atrial appendage. An antegrade cardioplegia and vent cannula was inserted into the aortic root.  The patient was then placed on cardiopulmonary bypass and cooled to 18 degrees centigrade. A left ventricular vent was placed through the right superior pulmonary vein. A retrograde cerebroplegia catheter was  inserted through a pursestring suture into the superior vena cava for giving retrograde cerebral perfusion during hypothermic arrest.  When the patient had cooled to an esophageal and bladder temperature of 18  degrees centigrade, the head was placed in the Trendelenburg position. The aorta was then cross clamped just proximal to the aortic cannula. Then 600 cc of cold blood antegrade cardioplegia was administered in the aortic root with complete cessation of electrical activity in the heart. Then the cadiopulmonary bypass pump was stopped and retrograde cerebral protection was begun to 200 cc per minute. Then the innominate artery aneurysm was opened and a large amount of atheromatous and inflammatory debris was removed. This was sent for culture. The orifice of the innominate artery was identified on the aortic arch. This area was somewhat abnormal. It was debrided back to healthy appearing tissue. Then the orifice of the innominate artery was closed using a polyester gelatin impregnated patch graft that was soaked in rifampin. This was sewn to the aorta using continuous 3-0 Prolene suture. Prior to tying these sutures, the cardiopulmonary bypass pump was again started slowly. The aortic arch was completely deaired. Then the sutures to the patch were tied. Bypass flow was brought back up to normal and retrograde cerebral protection was discontinued. The patch appeared hemostatic.  Then the other limb of the Y graft was anastomosed to the distal innominate artery just before his bifurcation and this will be dictated separately by Dr. Madilyn Fireman. While this was being performed, the  patient was rewarmed back to 37 degrees centigrade. Two temporary right ventricular and a right atrial pacing wires placed and brought out through the skin. The left ventricular vent and aortic root vents were removed. When the patient had rewarmed to 37 degrees centigrade, he was weaned from cardiopulmonary bypass  on no ionotropic agents. Total circulatory arrest time was 25 minutes. Total cross-clamp time was 35 minutes. Total bypass time was 123 minutes. He weaned from bypass without difficulty and maintained stable hemodynamics. Protamine was given and the venous and aortic cannulas were removed without difficulty. The patient was given 8 units of platelets and 4 units of FFP for coagulopathy. This resulted in improved hemostasis but the patient still appeared somewhat coagulopathic. He was therefore given another 8 units of platelets. This resulted in adequate hemostasis. The distal anastomoses were all examined and appeared hemostatic. The cannulation sites were hemostatic. Then three chest tubes were placed, ___ in the posterior pericardium and one in the anterior mediastinum, and one in the left pleural space. The sternum was closed with #6 stainless steel wires. The fascia was closed with continuous #1 Vicryl suture. The subcutaneous tissue was closed using 2-0 Vicryl and the skin with 3-0 Vicryl subcuticular skin closure. The right neck incision was closed in a similar manner with a Jackson-Pratt drain left in the depth of the wound in the bed of the carotid artery. The sponge, needle and instrument counts were correct according to the scrub nurse. Dry sterile dressings applied over the incisions and around the chest tube, which hooked to Pleur-evac suction. The patient remained hemodynamically stable and was transferred to the SICU in guarded but stable condition. DD:  09/11/00 TD:  09/12/00 Job: [id number]

## 2010-08-16 NOTE — Discharge Summary (Signed)
[place]. [facility]  Patient:    [name], [name] Visit Number: [id number] MRN: [id number]          Service Type: MED Location: [phone] Attending Physician:  Ezequiel Kayser Dictated by:   Rodrigo Ran, M.D. Admit Date:  03/02/2001 Discharge Date: 03/05/2001   CC:         Dewayne Shorter, M.D., Infectious disease  Izell Moore. Elesa Hacker, M.D., Neurosurgery  P. Bud Face, M.D., Cardiovascular Thoracic Surgery   Discharge Summary  DISCHARGE DIAGNOSES:  1. 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  2. Persistent fluid collections in the anterior L4-L5 interspace and in the     right lateral aspect of the L4-L5 interspace and a small fluid collection     in the left posterolateral aspect of the L5 vertebral body and the L5     pedicle and evidence of discitis and osteomyelitis at those levels by     CT scan.  However, at this point this is felt to be sterile post     surgical and post infectious changes.  Fine needle aspirate done of this     area, initial Gram stain is negative. Follow-up cultures are pending.  3. Cleft palate.  4. Hypertriglyceridemia.  5. Atherosclerotic peripheral vascular disease.  6. Type 2 diabetes.  7. Hypertension.  8. Chronic low back pain secondary to the above previous infection and     previous history of spinal stenosis.  9. Insomnia. 10. Anemia most likely of chronic disease.  PROCEDURES:  Needle aspiration of the L4-L5 disc space level.  This revealed sparse material.  Gram stain is initially negative for white cells or organisms and cultures, aerobic and anaerobic, are pending.  DISCHARGE MEDICATIONS:  These are the same as admission medications and there have been no changes.  These include:  1. OxyContin 20 mg b.i.d.  2. Cardia XT 180 mg q.d.  3. Claritin 10 mg q.d.  4. Potassium 20 mEq q.d.  5. Hydrocodone two tablets q.6h. p.r.n.  6.  Protonix 40 mg q.d.  He may discontinue this.  7. Gemfibrozil 600 mg b.i.d.  8. Neurontin 300 mg t.i.d.  9. Insulin N 12 units q.a.m. and 7 units q.p.m. 10. Vitamins. 11. Astelin nasal spray one spray each nostril twice daily. 12. Fiber pill.  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  He had been doing well as an outpatient; however, has had some increase in his low back pain and at a recent clinic visit, was noted to have a markedly elevated sedimentation rate in the upper 90s and an elevated CRP level.  Subsequently, he was seen by infectious disease and had a CT scan which showed persistent or recurrent fluid collections with possible some increase in the size of the fluid collections in the lumbar spine area.  He was, therefore, admitted for further evaluation.  HOSPITAL COURSE:  [name] was admitted.  He looked well during his entire hospital stay and did not have any fevers.  His back pain remained stable.  He did have some insomnia and was given Ambien with some help with this.  Blood cultures remained negative both from two to three weeks before the hospitalization and from this hospitalization.  Infectious disease was consulted and helped to direct the evaluation.  He did subsequently, on March 04, 2001, undergo a  needle aspiration of the L4-L5 interspace area. Initial Gram stain was negative and follow-up cultures are pending at the time of discharge.  DISCHARGE PHYSICAL EXAMINATION:  GENERAL APPEARANCE:  [name] is sitting, eating breakfast in no acute distress.  VITAL SIGNS:  Temperature 97, pulse 80, respiratory rate 20, blood pressure 133/50.  CBGs have been under excellent control.  LUNGS:  Clear.  CARDIOVASCULAR:  Regular.  EXTREMITIES:  No edema.  DISCHARGE LABORATORY DATA:  Again we are awaiting the aspiration culture.  He did have a cholesterol profile done; a  fasting cholesterol profile which showed a total cholesterol of 120, triglycerides 50, HDL 41, LDL 69.  Two sets of blood cultures from February 01, 2001, are negative at the time of discharge.  Two sets of blood cultures from February 11, 2001, are negative at the time of discharge.  Sedimentation rate this admission was 33.  CRP was 0.4.  White count was 4.8 with a normal differential.  Hemoglobin was 9.8 with an MCV of 92 and platelet count was 139,000.  His BUN and creatinine were 27 and 1.3 with an otherwise normal basic metabolic panel.  FOLLOW-UP INSTRUCTIONS:  [name] is to continue to follow a diabetic diet. He is to call with any problems.  He is to keep his follow-up office visit with me which is later this month. He will follow up with infectious disease as directed by them.  He will also follow up with cardiovascular thoracic surgery to further address his problem of having a descending thoracic aortic aneurysm. Dictated by:   Rodrigo Ran, M.D. Attending Physician:  Ezequiel Kayser DD:  03/05/01 TD:  03/05/01 Job: [id number]

## 2010-08-16 NOTE — Discharge Summary (Signed)
[place]. [facility]  Patient:    [name], [name]                      MRN: [id number] Adm. Date:  [id number] Disc. Date: 09/25/00 Attending:  Cleatrice Burke Dictator:   Dominica Severin, P.A. CC:         Rockey Situ. Flavia Shipper., M.D.  Rodrigo Ran, M.D.  [facility] Office  Alleen Borne, M.D.   Discharge Summary  DATE OF BIRTH:  [birth year].  ATTENDING: 1. Alleen Borne, M.D. 2. Denman George, M.D.  PRIMARY ADMISSION DIAGNOSES:  Acute polyarthritis.  SECONDARY DIAGNOSES/PAST MEDICAL HISTORY: 1. Cleft palate. 2. Recurrent sinusitis; status post sinus surgery in 1998. 3. Perenial allergic rhinitis. 4. Peripheral vascular disease status post bilateral lower extremity bypasses. 5. Hypertriglyceridemia. 6. L2 through L5 spinal stenosis; status post recent procedure. 7. Diabetes mellitus, diet controlled.  NEW DIAGNOSES/DISCHARGE DIAGNOSES: 1. Methicillin resistant Staphylococcus aureus bacteremia with seeding in    multiple sites including both wrists, right elbow and shoulder and psoas    muscle bilaterally. 2. Status post incision and drainage of both wrists and right shoulder for    abscesses. 3. 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 4. 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 5. Diabetes mellitus, type 2, requiring insulin therapy.  PROCEDURES THIS ADMISSION: 1. Arch/descending thoracic aortogram done on June 75, 2002. 2. Ascending aorta-innominate artery bypass and left common carotid artery    bypass with deep hypothermic circulatory arrest and using a Rifampin-soaked    Y graft. 3. Duplex venous evaluation of lower  extremities done on September 14, 2000. 4. MRI of the lumbosacral spine done on September 18, 2000. 5. Transesophageal echocardiogram done on September 23, 2000.  HOSPITAL COURSE:  This patient is a 77 year old Caucasian male who previously underwent surgery for lumbar spinal stenosis seventy-five months ago and subsequently was readmitted with worsening low back pain as well as multiple swollen, warm red joints.  He was diagnosed with MRSA bacteremia and seeding of multiple sites as stated above.  He has recurrent positive blood cultures on IV antibiotics, although this did end up being negative on a three-drug regimen which included vancomycin, Rifampin and Synercid.  He had required incision and drainage of both wrists as well as the right shoulder for abscesses in these areas.  The patient had undergone a transesophageal echocardiogram to rule out any valvular abnormality.  It did not show any valvular abnormality. Nonetheless, it did show normal left ventricular function.  There was no evidence of valvular vegetation.  It showed him to have a penetrating ulcer which was noted in the descending thoracic aorta.  The patient also underwent MRI of the chest and abdomen, which showed a probable mycotic aneurysm involving the origin and proximal portion of the innominate artery, with a second aneurysm in the descending thoracic aorta at the diaphragmatic level. The wall around the aneurysm was grossly abnormal for a few cm and there appeared to be occlusion of the innominate artery by this aneurysm.  At the time of consultation by Dr. Laneta Simmers and Dr. Madilyn Fireman, the patients only complaint was pain  in his left leg due to the psoas abscess.  It was known that the patient would be at serious risk for undergoing surgery and that for repair of these aneurysms, it would have to be done in a staged process.  it was also noted that the patient would have a difficult time recovering from either of the procedures.  Nevertheless,  the patient was continued on IV antibiotics and the decision to undergo surgery was discussed with his family members.  The patient also underwent arch and descending thoracic aortogram on June 75, 2002, which confirmed the anatomy of these aneurysms and provided the best map for the surgeons to decide what the best approach for the surgery would be. The patient then underwent the procedure as stated above on September 11, 2000.  He tolerated the procedure well.  His postoperative course was essentially uneventful.  He was stable from a cardiac and respiratory standpoint.  His main issue was pain control and rehabilitation.  His cultures remained negative postoperatively and he was followed by infectious disease as well as neurology.  He steadily began to progress and continued to remain stable.  A follow-up MRI of his lumbosacral spine revealed that the psoas abscess had improved and there was no localized mass.  Therefore, there was no immediate intervention by neurosurgery at this point for any of his leg pain. Nevertheless, he was continued to work with rehabilitation.  He had a follow-up transesophageal echocardiogram to evaluate the aneurysms again and the posterior ulcer in his thoracic aorta.  The echocardiogram report is on the chart.  Nevertheless, it revealed that there was mobile atheroma versus vegetation present on the posterior aspect of the thoracic aorta, and it appears to be smaller than the first transesophageal echocardiogram done on September 02, 2000.  There was also a pseudoaneurysm versus ulcer along the posterior wall, which was unchanged from the previous study.  There were no valvular abnormalities found on the transesophageal echocardiogram.  Again, the patient was continued on triple-antibiotic therapy and he was seen by  physical medicine rehabilitation, and it was determined that the patient would benefit from going to the subacute care unit for further rehabilitation.   Dr. Madilyn Fireman is to follow up in rehabilitation and decide when to intervene to repair his descending thoracic aortic aneurysm at a later date.  Dr. Waynard Edwards will be following the patient while he is on the subacute care unit.  CONDITION ON DISCHARGE:  Stable and improved.  DISCHARGE MEDICATIONS:  1. Coated aspirin 325 mg q.d.  2. Diltiazem 60 mg q.8h.  3. Lopressor 50 mg p.o. b.i.d.  4. Insulin NPH, 15 units q.d. a.c. with 5 units q.a.c.s.  5. Potassium chloride 20 mEq q.8h.  6. Lopid 600 mg b.i.d. a.c.  7. Claritin 10 mg q.d.  8. Colace 200 mg q.d.  9. Rifampin 300 mg p.o. b.i.d. 10. Magic Mouthwash 5 ml swish and swallow t.i.d. a.c. 11. Oxycodone 20 mg b.i.d. 12. Neurontin 300 mg p.o. q.8h. 13. Lamisil b.i.d. to the perineum. 14. Sliding scale insulin per rehabilitation. 15. Percocet, 1-2 tablets p.o. q.3h. p.r.n. 16. Ambien 5-10 mg q.h.s. p.r.n. 17. Lovenox 30 mg q.12h. 18. Synercid 500 mg IV q.8h. - at 1600. 19. Vancomycin 1250mg  q.12h. - at 1800. 20. Zofran 4 mg q.6h. p.r.n.  ACTIVITY AND FOLLOW UP:  The patient will have activity dictated as per the subacute care unit.  He will follow up with Dr. Laneta Simmers and Dr. Madilyn Fireman after discharge from subacute care unit.  On rehabilitation, Dr. Madilyn Fireman will see the patient and decide when to intervene at a later date. DD:  09/25/00 TD:  09/25/00 Job: [id number]

## 2010-08-16 NOTE — Op Note (Signed)
NAMEBAYLER, [name]                         ACCOUNT NO.:  [id number]   MEDICAL RECORD NO.:  [id number]                   PATIENT TYPE:  INP   LOCATION:  0478                                 FACILITY:  [facility]   PHYSICIAN:  Ollen Gross, M.D.                 DATE OF BIRTH:  February 20, 1931   DATE OF PROCEDURE:  01/29/2002  DATE OF DISCHARGE:                                 OPERATIVE REPORT   PREOPERATIVE DIAGNOSIS:  Displaced right femoral neck fracture.   POSTOPERATIVE DIAGNOSIS:  Displaced right femoral neck fracture.   PROCEDURE:  Right hip hemiarthroplasty.   SURGEON:  Ollen Gross, M.D.   ASSISTANT:  Alexzandrew L. Julien Girt, P.A.   ANESTHESIA:  General.   ESTIMATED BLOOD LOSS:  750 cc.   DRAINS:  Hemovac x 1.   COMPLICATIONS:  None.   CONDITION:  Stable to recovery.   INDICATIONS:  The patient is a 77 year old male who presented to our office  yesterday with a several week history of progressively increasing right hip  pain.  He has had pain for several months but for the past 1-1/2 weeks he  has been unable to ambulate.  He had x-rays taken yesterday showing  displaced right femoral neck fracture. He was admitted to the hospital and  goes to the operating room this morning for the above mentioned procedure.   DESCRIPTION OF PROCEDURE:  After the successful administration of general  anesthetic, the patient was placed in the left lateral decubitus position,  right side and held with the hip positioner.  The right lower extremity was  isolated with the perineoplasty drape and prepped and draped in the usual  sterile fashion.  Standard posterior lateral incisions made with the 10  blade through the subcutaneous tissue to the level of the fascia lata which  was incised in line with the skin incision.  The sciatic nerve was palpated  and protected and the short external rotator was isolated up to the femur  with the capsule.  This is a very thickened mass of tissue and I  do believe  that we are dealing with some chronic scarring there.  This is probably not  an acute fracture.  The tissue is elevated off the posterior femur and then  the femur retractor anteriorly.  The femoral head was subsequently removed  and the angle measured to be 54 mm.  Acetabulum was cleaned of any abnormal  soft tissue.  The wound was copiously irrigated.  He had a lot of friable  tissue on the posterior capsule, and hemostasis was subsequently achieved.  The fracture was very high in the subcapital area, thus we osteomized the  femoral neck to the appropriate level and then prepared the femoral canal  first with a canal finder, irrigation, and broaching.  We broached up to 13  mm.  We placed the size 3 cement restrictor at the appropriate level in  the  canal.  I then sized the acetabulum; it was 54 mm.  The canal of the femur  was then prepared with pulsatile lavage, cement mixed and once ready for  implantation, the Excel 13 mm cemented stem was cemented into place in the  matching state of anteversion.  Once the cement was fully hardened, the  trial 1.5 54 bipolar was placed.  The hip was reduced with excellent  stability.  The trial was then removed.  The permanent 1.5 28 mm head and a  54 bipolar was then placed.  Hip reduced with the same stability.  Rotated  the capsule.  Reattached the femur with Ethibond.  The wound was copiously  irrigated.  Fascia lata was closed over a Hemovac drain with interrupted #1  Vicryl.  Subcu closed with interrupted 2-0 Vicryl, subcuticular with running  4-0 Monocryl.  Incisions clean and dry.  Steri-Strips and bulky sterile  dressing applied.  The patient was then awakened and transported to the  recovery room in stable condition.                                               Ollen Gross, M.D.    FA/MEDQ  D:  01/29/2002  T:  01/30/2002  Job:  [id number]

## 2010-08-16 NOTE — Op Note (Signed)
[place]. [facility]  Patient:    [name], [name]                      MRN: [id number] Proc. Date: 07/27/00 Adm. Date:  [id number] Attending:  Rodrigo Ran A                           Operative Report  PREOPERATIVE DIAGNOSES:  Bilateral hand abscesses.  POSTOPERATIVE DIAGNOSES:  Bilateral hand abscesses.  PROCEDURE:  Bilateral hand irrigation and debridement and open packing.  SURGEON:  Artist Pais. Mina Marble, M.D.  ASSISTANT:  RN  ANESTHESIA:  General.  TOURNIQUET:  None.  COMPLICATIONS:  None.  DRAINS:  None.  OPERATIVE REPORT:  Patient was taken to the operating room where after induction of general anesthesia both hands and arms were prepped and draped in the usual sterile fashion.  At this time open wounds that had been previously I&D 48 hours ago were irrigated and debrided again using 6 L of normal saline on both sides and skin was debrided down to healthy tissue.  After this was done the wounds were packed open with saline soaked gauze and Kerlix and cling and wrapped with a compression dressing of 4 x 4 Kerlix cling and Ace wraps bilaterally.  Patient tolerated procedure well and went to recovery in stable fashion. DD:  07/27/00 TD:  07/27/00 Job: [id number]

## 2010-08-16 NOTE — Discharge Summary (Signed)
[place]. [facility]  Patient:    [name], [name]                      MRN: [id number] Adm. Date:  [id number] Disc. Date: [id number] Attending:  Jackelyn Knife                           Discharge Summary  SUMMARY:  [name] is a generally healthy 77 year old man who had been monitored in the office for a number of months because of progressive difficulty with back and right leg pain.  He underwent an MRI and also myelography and CT scanning which demonstrated lateral stenosis with round stenosis on the right, worse at L2-3 and L3-4.  After an appropriate preoperative work-up, he was taken to the operating room on April 16, 2000, and underwent a right T3 and right L3-4 hemilaminotomy, foraminotomy and discectomy.  His postoperative course was delayed by one day because of the requirement of needing a urinary catheter on one occasion.  He did well after that.  He was discharged on April 17, 2000, to the care of his family.  FINAL DIAGNOSIS:  Lumbar stenosis L2-3, L3-4 right with herniated intervertebral disc.  CONDITION AT DISCHARGE:  Improving.  INSTRUCTIONS TO PATIENT: 1. Up ad lib with a 10 pound weight lifting restriction for four weeks. 2. Regular diet. 3. Come back to see me in a week. 4. Discharge medications:  Lorcet Plus. DD:  04/30/00 TD:  05/01/00 Job: [id number]

## 2010-08-16 NOTE — Procedures (Signed)
BYPASS GRAFT EVALUATION   INDICATION:  Left fem-pop bypass graft.   HISTORY:  Diabetes:  Yes.  Cardiac:  Innominate and aortic arch replacement.  Hypertension:  Yes.  Smoking:  Previous.  Previous Surgery:  Repair of stenosis in the left fem-pop bypass graft  in 1999, right EIA PTA in 1997.   SINGLE LEVEL ARTERIAL EXAM                               RIGHT              LEFT  Brachial:                    149                149  Anterior tibial:             105                Not detected  Posterior tibial:            101                142  Peroneal:                                       140  Ankle/brachial index:        0.70               0.95   PREVIOUS ABI:  Date: 08/10/2008  RIGHT:  0.64  LEFT:  0.95   LOWER EXTREMITY BYPASS GRAFT DUPLEX EXAM:   DUPLEX:  Biphasic Doppler waveforms noted throughout the left lower  extremity bypass graft and its native vessels with an increased velocity  of 280 cm/s noted at the proximal anastomosis level.   IMPRESSION:  1. Patent left femoral-popliteal bypass graft with increased velocity      noted, as described above.  2. Stable bilateral ankle brachial indices.   ___________________________________________  Janetta Hora Fields, MD   CH/MEDQ  D:  02/01/2009  T:  02/01/2009  Job:  [id number]

## 2010-08-16 NOTE — Cardiovascular Report (Signed)
[place]. [facility]  Patient:    [name], [name] Visit Number: [id number] MRN: [id number]          Service Type: DSU Location: [facility] 2899 16 Attending Physician:  Melvenia Needles Dictated by:   Denman George, M.D. Proc. Date: 01/08/01 Admit Date:  01/08/2001   CC:         Ravi R. Felipa Eth, M.D.   Cardiac Catheterization  DIAGNOSIS: Deep penetrating thoracic aortic ulcer.  PROCEDURE: Thoracic aortogram.  ACCESS: Right common femoral, 5 French sheath.  CONTRAST: Visipaque 85 mL (severe generalized debilitation).  COMPLICATIONS: None apparent.  CLINICAL NOTE: This is a 77 year old male, with a history of staph sepsis. 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 At the time of TEE he was noted to have a deep penetrating ulcer of the thoracic aorta. He has now recovered from his operative procedure and is brought to the catheterization lab at this time for thoracic aortogram.  DESCRIPTION OF PROCEDURE: The patient was brought to the catheterization laboratory in stable condition. He was placed in the supine position.  The groins were prepped and draped in a sterile fashion. He was administered 1 mg of Versed, 2 mg of Nubain intravenously. The skin and subcutaneous tissue in the right groin was instilled with 1% Xylocaine.  A needle was easily introduced in the right common femoral artery.  A 0.35 J wire passed through the needle into the mid thoracic aorta. A 5 French sheath was then advanced over the guide wire. A standard pigtail catheter was then advanced over the guide wire to the aortic knob.  A standard AP thoracic aortogram was obtained. This revealed mild tortuosity of the thoracic aorta. There was irregularity noted in the mid thoracic aorta.  The pigtail catheter was then brought down closer to the site of ulceration in the aorta. A lateral aortogram was obtained. This  revealed a deep penetrating ulcer of the thoracic aorta. There was a adequate segment of aorta proximal and distal to the ulcer. The mesenteric vessels were noted to arise significantly distal to the ulcer.  The patient tolerated the procedure well.  The guide wire was reinserted and the pigtail catheter removed.  The patient transferred to the holding area for removal of right common femoral sheath.  No apparent complications.  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   DISPOSITION: This patient will be referred for possible covered stenting of the segment. Dictated by:   Denman George, M.D. Attending Physician:  Melvenia Needles DD:  01/08/01 TD:  01/08/01 Job: [id number]

## 2010-08-16 NOTE — H&P (Signed)
[place]. [facility]  Patient:    CHANEY, MACLAREN                      MRN: [id number] Adm. Date:  [id number] Attending:  Rodrigo Ran A                         History and Physical  CHIEF COMPLAINT:  Multiple, painful, swollen joint and severe low-back pain.  HISTORY OF PRESENT ILLNESS:  Mr. Bensinger is a pleasant 77 year old male with a history of cleft palate, chronic sinus disease, peripheral vascular disease, and spinal stenosis status post a fairly recent surgery for this by Dr. Elesa Hacker.  He has had a gradually healing lumbar wound since surgery.  Over the last few weeks, Mr. Rosete has had worsening low-back pain and has required much more pain medication.  Furthermore, over the last 2-3 days he has had the acute onset of multiple, swollen, warm, red joints.  These include bilateral hands, wrists, the right elbow, and right shoulder.  He is to be admitted for further evaluation and treatment.  PAST MEDICAL HISTORY: 1. Cleft palate. 2. Recurrent sinusitis, status post sinus surgery in 1998. 3. Perennial allergic rhinitis. 4. Peripheral vascular disease, status post bilateral lower extremity arterial    bypasses. 5. Hypertriglyceridemia. 6. L2-5 spinal stenosis, status post recent procedure for this.  ALLERGIES:  CODEINE.  MEDICATIONS:  1. Allegra 180 mg p.o. q.d.  2. Lopid 600 mg b.i.d.  3. Chlordiazepoxide 10 mg p.r.n. only taking rarely.  4. Multivitamin daily.  5. Chromium daily.  6. BC Powder 3-4 per day.  7. Rhinocort Nasal Spray daily.  8. Recently has been taking hydrocodone/APAP 7.5/750 and oxycodone     5/325 q.4h. around-the-clock.  9. Methocarbamol 750 p.r.n.; however, this has been stopped the last couple     days. 10. Stool softener.  SOCIAL HISTORY:  He has been a widower since 72.  He has two children.  He has a 50+ pack year smoking history and continues to smoke one pack of cigarettes per day.  He denies any alcohol or drug  use.  FAMILY HISTORY:  Father died at age 71 of congestive heart failure, rheumatoid arthritis, and infections.  Mother died of an MI.  He has a sister with diabetes and a son with Marfans syndrome who has had an AAA repaired.  REVIEW OF SYSTEMS:  Mostly positive for severe pain in his low back.  It does have some radiation to his left lower extremity.  He has had some constipation for the last two days due to his increased narcotics.  He has possibly had some chills and low-grade temperature but no definite fever.  He denies any other GI or GU symptoms and he denies any chest pain or shortness of breath; however, in general he has had a poor appetite and feels ill.  PHYSICAL EXAMINATION:  VITAL SIGNS:  Temperature 97.2, blood pressure 146/103, pulse 121, respiratory rate 30.  GENERAL:  Sitting semisupine in no acute distress.  He is alert and oriented x 4.  LUNGS:  Clear to auscultation bilaterally.  HEART:  Regular with no murmur.  ABDOMEN:  Soft.  His lumbar wound is clean and dry, nontender.  There is no drainage.  EXTREMITIES:  He does have significant swelling, erythema, and warmth of his bilateral hands and wrists, as well as his right elbow.  His right shoulder is also tender and has decreased  range of motion.  He has no significant other joint findings.  He has movement of extremities x 4 and strength is grossly normal.  LABORATORY DATA:  Sed rate, rheumatoid factor, CBC, plain films of the hands, blood cultures, and CMET are all pending.  ASSESSMENT AND PLAN:  A 77 year old male with worsening back pain and now acute polyarthritis.  We will admit.  We will give gentle IV fluids, offer pain control with Vicodin and IV morphine for breakthrough.  We will follow up admission labs and we have asked orthopedics to see to consider arthrocentesis of one or more of these joints to attempt to rule out a septic process.  Other possibilities in the differential include acute  gouty arthritis, new onset rheumatoid arthritis, or other polyarthritis.  We will notify neurosurgery of this patients presence and we will obtain an MRI tomorrow, as he was due for one tomorrow anyway and we would like to rule out an epidural abscess or other process in the lumbar spine.  We will continue his other medications.  The patient is full code status. DD:  07/21/99 TD:  07/21/00 Job: [id number]

## 2010-08-16 NOTE — Op Note (Signed)
[place]. [facility]  Patient:    [name], [name] Visit Number: [id number] MRN: [id number]          Service Type: FTC Location: FOOT Attending Physician:  Nadara Mustard Dictated by:   [name], M.D. Proc. Date: 07/30/00 Admit Date:  07/13/2001 Discharge Date: 07/19/2001                             Operative Report  PREOPERATIVE DIAGNOSIS:  Bilateral hand infections.  POSTOPERATIVE DIAGNOSIS:  Bilateral hand infections.  PROCEDURES:  Bilateral irrigation and debridement and secondary wound closure of left hand.  SURGEON:  Christopher Pais. Mina [name], M.D.  ASSISTANT:  Junius Roads. [place], P.A.C.  ANESTHESIA:  General.  COMPLICATIONS:  None.  DRAINS:  None.  TOURNIQUET:  None.  DESCRIPTION OF PROCEDURE:  The patient was taken to the operating room.  After induction of general anesthesia, the left upper extremity was prepped and draped in the usual sterile fashion.  An Esmarch was used to exsanguinate the limb.  The tourniquet was inflated to 250 mmHg.  At this point in time, an open wound that had been previous I&D was irrigated and debrided down to healthy tissue and close secondarily with 4-0 nylon.  All of the devitalized tissue was debrided again down to healthy tissue and the wound was secondarily closed with 4-0 nylon.  The patient was then placed in a sterile dressing of Xeroform, 4 x 4s, fluffs, and compressive dressing.  The patient tolerated the procedure well and went to the recovery room in stable fashion. Dictated by:   [name], M.D. Attending Physician:  Nadara Mustard DD:  07/26/01 TD:  07/26/01 Job: [id number]

## 2010-08-16 NOTE — Op Note (Signed)
[place]. [facility]  Patient:    KEENEN, ROESSNER                      MRN: [id number] Proc. Date: 04/16/00 Adm. Date:  [id number] Attending:  Jackelyn Knife                           Operative Report  PREOPERATIVE DIAGNOSIS:  Spondylosis and disk protrusion right L2-3 and L3-4.  POSTOPERATIVE DIAGNOSIS:  Spondylosis and disk protrusion right L2-3 and L3-4.  OPERATION PERFORMED:  Right L2-3 and L3-4 hemilaminotomies, foraminotomies and diskectomies.  SURGEON:  Izell [place]. Elesa Hacker, M.D.  ASSISTANT:  Clydene Fake, M.D.  DESCRIPTION OF PROCEDURE:  Under general endotracheal anesthesia, the usual subperiosteal approach was carried out to the right L2-3 and L3-4 interlaminar spaces.  Proper operative levels were confirmed with intraoperative x-rays.  Using the operating microscope and high-speed drill, hemilaminotomies were done at each of the two levels.  The findings were very similar and included thickened ligamentum flavum, disk protrusions at ______ foramen with stenosis.  Decompression was carried out by means of removal of bone, diskectomies at each of the two levels and removal of thickened ligaments. Careful palpation revealed no evidence of extruded disk fragments at either level.  There was no evidence of malignancy.  The wound was copiously irrigated following decompression and bleeding points were electrocoagulated or controlled with Gelfoam pledgets.  The wound was then closed in layers and a sterile dressing applied.  The patient was taken to the recovery room in good condition. DD:  04/16/00 TD:  04/16/00 Job: [id number]

## 2010-08-16 NOTE — H&P (Signed)
[place]. [facility]  Patient:    [name], [name]                        MRN: [id number] Adm. Date:  04/16/00 Attending:  Izell [place]. Elesa Hacker, M.D.                         History and Physical  HISTORY OF PRESENT ILLNESS:  Mr. [name] is a 77 year old man, initially seen through the courtesy of Dr. Seymour Bars.  He was worked up initially back in July of 2001 with a problem that started with some left thigh pain which cleared but then was replaced by low-back pain with radiation of pain down his right hip and right leg to about the knee.  The problem on the left side has cleared.  Bowel or bladder function were unaffected.  His pain has been quite severe and had begun to interfere with his daily activities at the time of the initial visit.  He has since had slow progression and worsened to the point that he is now unable to do his part-time job.  He notes that he gets some relief by bending forward, or if he is grocery shopping, by leaning forward on a grocery cart.  He has changed his daily activities and has reduced his normal level of activity because of his problem.  Mr. Flaum was evaluated initially with a number of things, including an MRI study that was done in July of 2001.  That study showed multilevel degenerative disk changes, most significant at L2-3, L3-4 and L4-5.  Stenosis was noted.  It was elected to try to manage this conservatively and he underwent a series of epidural steroid injections which helped him to some extent but the benefit from those procedures did not last.  Physical therapy also was tried.  His symptoms progressed and eventually, it was felt that surgery might be necessary.  With that in mind, he underwent a lumbosacral myelogram and  CT scan that was done in August.  That study showed problems most marked on the right side at L2-3 and particularly L3-4 which showed a disk herniation centrally and also right-sided, extending  to the foraminal and extra foraminal area.  There was also a problem at L4-5 but this was more left-sided and was felt not to contribute to his chief complaint of right-sided leg pain.  Having exhausted all conservative measures, and faced with progression of this major problem, surgical decompression at L2-3 and L3-4 on the right side was recommended.  A careful explanation of the procedure along with the attendant risk was carried out with him and his son on December 26.  He is admitted at this time for decompression as mentioned above.  He understands that risks include the possibility of bleeding, infection, damage to the common dural tube with CSF leak, potential damage to nerve roots or never contained in the common dural tube with results of weakness and/or sensory loss.  PAST MEDICAL HISTORY:  His past medical history shows that he has had some vascular problems.  Most significantly he underwent revascularization of his left leg and has been closely followed for this.  Dr. Liliane Bade did this surgery at [facility] and he was recently reevaluated and found to be patent and doing well.  He has had no trouble with stroke or diabetes.  FAMILY HISTORY:  Family history indicates his mother died at age  78 of a heart attack.  His father died at age 40 for heart failure.  He has a 73 year old sister with diabetes.  He has two children, one of whom has Marfan syndrome and has had an aortic valve replacement.  His 20 year old daughter has some chronic stomach problems.  PAST SURGICAL HISTORY:  Previous surgery consists of the femoral artery to popliteal artery bypass as mentioned above.  He also had some large intestinal polyps removed back in the 1980s.  He had surgery for clef lip in 1948.  CURRENT MEDICATIONS:  Gemfibrozil for cholesterol and he also has taken Allegra.  ALLERGIES:  He has no medical allergies.  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  He does smoke a pack of cigarettes a day but does not use alcohol.  REVIEW OF SYSTEMS:  Negative except as already noted.  PHYSICAL EXAMINATION:  GENERAL:  On examination, he is alert, quite pleasant, cooperative.  VITAL SIGNS:  He weighs 162 pounds at 5 feet 11 inches tall.  Blood pressure is 143/83 with a pulse of 85.  He is afebrile.  HEENT:  Examination of head, eyes, ears, nose, and throat is normal with exception of speech and surgical changes consist with a clef lip repair.  He has no cervical adenopathy and he has no mass lesions anywhere.  CHEST:  Clear.  CARDIAC:  Examination shows no murmurs or enlargement.  He has no edema.  ABDOMEN:  Soft and nontender with no palpable masses.  NEUROLOGICAL:  Examination reveals sensorium and cranial nerves are intact. Deep tendon reflexes are somewhat hypoactive at both the knees and ankles but there is no asymmetry.  There is a questionable weakness of the quadriceps muscle on the right side.  Range of motion of the lumbosacral spine is somewhat limited on far bending and tilting to the right, which increases his discomfort.  He can walk on heel and toe.  Hyperextension of his lumbosacral spine increases his discomfort in both the back and right leg; his left leg is not involved.  A sensory examination shows some hypesthetic changes over the anterior thigh on the right side.  I can palpable peripheral pulses.  IMPRESSION:  Spinal stenosis and disk herniations, right L2-3 and L3-4. DD:  04/15/00 TD:  04/15/00 Job: [id number]

## 2010-08-16 NOTE — Discharge Summary (Signed)
[place]. [facility]  Patient:    [name], [name]                      MRN: [id number] Adm. Date:  [id number] Disc. Date: 10/26/00 Attending:  Ezequiel [name] CC:         [name], M.D.  [name], M.D.  [name], M.D.  [name]. [name], M.D.   Discharge Summary  DISCHARGE DIAGNOSES: 1. 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  In addition, the patient had mycotic pseudoaneurysms affecting    the ascending aorta and involvement of the innominate artery and left    common carotid artery as well as mycotic pseudoaneurysm of the descending    aorta. 2. Type 2 diabetes mellitus, new this admission. 3. Past history of peripheral vascular disease with past history of bilateral    lower extremity bypass. 4. Past history of hypertriglyceridemia. 5. Perennial allergic rhinitis and recurrent sinusitis.  PROCEDURES DURING SUBACUTE CARE UNIT STAY: 1. MRI of lumbar spine October 19, 2000. 2. CT scan of the abdomen and pelvis October 12, 2000. 3. Physical and occupational therapy.  PROCEDURES DURING PREVIOUS HOSPITAL STAY:  Outlined in Discharge Summary dated September 25, 2000.  These include I&D of bilateral hands and wrists and the right shoulder.  On September 17, 2000, the patient underwent an ascending aorta innominate artery bypass and left common carotid artery bypass.  Furthermore, he had undergone resection of the innominate artery aneurysm and repair of the aortic arch using a rifampin-soaked polyester graft.  DISCHARGE MEDICATIONS:  1. Aspirin 325 mg daily, enteric coated.  2. Dulcolax 10 mg p.o. q.d.  3. Metoprolol 50 mg p.o. b.i.d.  4. Potassium chloride 20 mEq q.d.  5. Lopid 600  mg b.i.d. with meals.  6. Claritin 10 mg q.d.  7. Magic Mouth Wash 5 ml swish and swallow t.i.d. a.c. p.r.n.  8. Neurontin 300 mg q.8h.  9. OxyContin 20 mg q8h. 10. Insulin N 7 units q.a.c. supper and 20 units q.a.c. breakfast. 11. Cardizem CD 180 mg q.d. 12. Astelin nasal spray, 1 spray each nostril twice daily. 13. Protonix 40 mg q.d. with meal. 14. Desitin ointment to reddened buttocks p.r.n. 15. Resource nutritional supplement t.i.d. with meals. 16. Ambien 5 to 10 mg q.h.s. p.r.n. 17. Sliding scale Humalog insulin.  The patient is to have q.a.c. and     q.h.s. CGBs.  Sliding scale for the q.a.c. checks should be: CBG less     than 70, follow hypoglycemia protocol; 70 to 150, no units of Humalog;     151 to 200, 2 units; 201 to 250, 4 units; 251 to 300, 6 units; greater     than 300, 8 units and recheck in 2 hours.  For the q.h.s. sliding scale:     CBG less than 70, hypoglycmeia protocol; 70 to 100, no units; 151 to     200, no units; 201 to 250, 2 units; 251 to 300, 3 units; greater than     300, 5 units and recheck in 2 hours. 18. Phenergan 25 mg p.o. or p.r. q.i.d. p.r.n.  HISTORY OF PRESENT ILLNESS:  Mr. [name] is a very pleasant 77 year old male  who, on April 16, 2000, underwent an L2-L3 and L3-L4 hemilaminectomy, foraminotomy, and diskectomy for spondylosis and disk protrusions at those levels.  The patient tolerated the procedure well and had significant improvement in his symptoms; however, he returned with a lumbar wound infection which required debridement and was diagnosed with MRSA in this wound.  He was treated with a course of vancomycin intravenously with apparent resolution of his infection.  However, the patient presented to the emergency room on July 20, 2000, with acute polyarthritis with notable severely swollen hands and wrists, right elbow, right shoulder, left knee, and with significant back pain.  He was empirically placed on antibiotics, and MRI confirmed  an epidural abscess.  The patient was admitted at that time and treated with vancomycin.  Subsequently during that part of his hospital stay, he did undergo debridement of his epidural abscess as well as I&D of his wrists bilateral and his right shoulder.  The patient showed some stabilization but continued to have positive blood cultures for MRSA and, therefore, per infectious disease was initiated on ______  and Rifampin therapy along with his vancomycin.  Furthermore, transesophageal echocardiogram revealed mycotic aneurysm and descending thoracic aorta ulceration.  These were confirmed by MRI, and the patient subsequently required repair of his ascending aorta aneurysm and innominate and carotid artery stenoses due to this process.  He tolerated these procedures quite well and subsequently was transferred to the subacute care unit on July 1 for further rehabilitation and continued antibiotics.  HOSPITAL COURSE:  Mr. [name] continued to progress well.  He showed no recurrent signs of fever, and his vital signs remained stable.  His blood sugars were controlled on insulin regimen.  He did have psoas muscle abscesses and continued epidural and lumbar spine changes.  These were followed with sequential CT scans and MRIs and showed significant improvement by the time of discharge.  The CT scan of his abdomen and pelvis from July 15 showed improvement in psoas musculature with resolution of enlargement and no identifiable collections of fluid.  He did have changes in the spine consistent with osteomyelitis, but no obvious abscesses were seen on this study.  No signs of pelvic infection were noted.  Furthermore, MRI of his lumbar spine on July 22 showed marked improvement in all the changes of the psoas muscle and no evidence of abscess, epidural or otherwise; however, there  continued to be chronic osteomyelitis changes and some mild musculature inflammation.  The patient was treated with a  total of 100 days of vancomycin and approximately 67 to 68 days of Rifampin and 67 days of ______ .  At the time of discharge, these were discontinued.  Mr. [name] did have difficulty with some pain extending into his left lower extremity.  These were controlled with Neurontin and OxyContin.   Furthermore, in the week of October 12, 2000, Mr. Tarts hemoglobin decreased to below 8, and he was transfused 2 units with an appropriate increase in his hemoglobin.  He as placed on proton pump inhibitor therapy, and his hemoglobin was continually monitored.  He had no definitive evidence of GI bleeding.  Mr. Ellner remained in good spirits and progressed gradually with physical therapy to where he could walk with a walker several hundred feet.  He will require further physical therapy to improve his independent status with hopeful eventual discharge home.  LABORATORY DATA:   On July 23, sodium 131, potassium 5.3, chloride 98, CO2 25, glucose 123, BUN 19, creatinine 0.9, calcium 10.0.  White blood count 5.8, hemoglobin 11.0, MCV 85, platelet count 168,000.  H. pylori IgG was positive on October 16, 2000.   Liver function tests from July 19 showed an alkaline phosphatase of 161 and an albumin of 3.0 but were otherwise within normal limits. RBC folate on July 17 was 490.  B12 level was 435.  Serum ferritin on July 17 was elevated at 1043.  Iron level was 138.   Total iron binding capacity 276 and percent saturation 50, which are all essentially normal.  LDH was normal at 155 on October 13, 2000.  INR was 1.3 on July 17.  Fecal occult blood test was positive, but there was no gross bleeding noted.  Haptoglobin was 204 on July 16.  Reticulocyte count was 0.8%.  Direct antiglobulin test was negative on July 16.  DISCHARGE INSTRUCTIONS:  Mr. Fessel will need to continue with physical therapy and occupational therapy.  He will need to continue with good nutritional intake.  He will need vital signs monitored daily  including temperature.  With any elevation of temperature or symptoms worrisome for return of infection, MD should be notified promptly to address this.  He will need to follow up in clinic two to three weeks after discharge with Dr. Waynard Edwards at [facility], [phone].  Furthermore, he will need infectious disease followup two to three weeks after discharge with Dr. Maurice March, and he will need a followup with Dr. Laneta Simmers of cardiovascular surgery for plan to possibly further address his descending aortic ulceration which remains.  While Mr. Seibert has had a tremendous MRSA infection, he has actually done amazingly well with this, and eventual goal will be to have him improve to return to independent functional living.  DD:  10/21/00 TD:  10/21/00 Job: [id number]

## 2010-08-16 NOTE — Op Note (Signed)
[place]. [facility]  Patient:    [name], [name]                      MRN: [id number] Proc. Date: 09/17/00 Adm. Date:  [id number] Attending:  Cleatrice Burke                           Operative Report  PREOPERATIVE DIAGNOSES: 1. 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 2. Innominate artery occlusion. 3. High-grade ostial stenosis, left common carotid artery.  POSTOPERATIVE DIAGNOSES: 1. 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 2. Innominate artery occlusion. 3. High-grade ostial stenosis, left common carotid artery.  PROCEDURES:  Ascending aorta-innominate artery bypass and left common carotid artery bypass.  SURGEON:  Denman George, M.D.  Mammie LorenzoAlleen Borne, M.D.  ANESTHESIA:  General endotracheal.  ANESTHESIOLOGIST:  Guadalupe Maple, M.D.  CLINICAL HISTORY:  This is a 77 year old male with a history of MRSA bacteremia.  This has seeded multiple sites, including his wrists, right elbow and shoulder and psoas muscles.  He has had positive blood cultures.  Workup for this included a transesophageal echocardiogram, which revealed evidence of a penetrating descending thoracic aortic ulcer with mobile pedunculated plaque.  The patient was seen in consultation by myself and Dr. Laneta Simmers, underwent MR angiography of the chest, and this verified a penetrating ulcer of the descending thoracic aorta and evidence of an innominate artery aneurysm.  Conventional arteriography was carried out.  This verified an innominate artery aneurysm, occlusion of the distal innominate artery, tight ostial stenosis of the left common carotid artery.  Dominant flow to the brain was via the left vertebral artery with cross-circulation down the right vertebral artery and into the right common carotid and right subclavian arteries.  It was recommended to the patient that this innominate artery aneurysm be resected out of concern for potential mycotic  aneurysm.  He consented for surgery.  Risks and benefits of the operative procedure explained to the patient in detail, including the potential risks of MI, CVA, bleeding, infection, transfusion, and death.  DESCRIPTION OF PROCEDURE:  The patient was brought to the operating room in stable condition.  Preoperative central venous catheter and arterial line were placed.  General endotracheal anesthesia induced in the supine position.  The neck and abdomen and both legs were prepped and draped in a sterile fashion. A right femoral arterial line was inserted.  A median sternotomy incision was carried out by Dr. Laneta Simmers and dictated under separate heading.  Dissection of the mediastinum was carried out and the ascending aorta exposed and freed.  Details of resection of the aneurysm and deep hypothermic circulatory arrest are dictated under a separate heading by Dr. Laneta Simmers.  The sternotomy incision was extended along the right neck.  Dissection carried down through the platysma.  The sternal head of the sternomastoid muscle was divided.  The proximal right common carotid artery was mobilized and encircled with a vessel loop.  The vagus nerve reflected laterally.  The origin of the right subclavian artery was also freed and encircled with a vessel loop.  The left common carotid artery origin was exposed.  The left vagus nerve reflected laterally.  The left common carotid encircled with a vessel loop.  The patient was fully heparinized.  A partial occlusion clamp was then placed on the right lateral ascending aorta.  A longitudinal arteriotomy made in the ascending aorta.  A 14 x 7 bifurcated gelatin-coated  woven Dacron graft was anastomosed end-to-side to the ascending aorta with a running 4-0 Prolene suture.  The proximal partial occlusion clamp was removed and each limb of the graft controlled with a Fogarty clamp.  Each limb of the graft was brought anterior to the innominate vein.  The  left limb of the graft was brought over to the left common carotid artery.  The left common carotid artery ligated proximally with an 0 silk tie and suture ligated with 4-0 Prolene.  The left common carotid artery divided transversely and controlled distally with a Glover clamp.  The left limb of the graft was divided and anastomosed end-to-end to the left common carotid artery with running 5-0 Prolene suture. Adequate flushing carried out and the clamps then released.  The patient then underwent deep hypothermic arrest.  The innominate artery aneurysm was resected and repaired by Dr. Laneta Simmers, dictated under separate heading.  During rewarming, the right limb of the graft was then brought up to the distal innominate artery.  The distal innominate artery was divided transversely, and the right limb of the graft was divided and anastomosed end-to-end to the distal innominate artery with running 5-0 Prolene suture. At completion of this, the vessels were flushed, clamps were removed, reperfusing the right subclavian and common carotid arteries.  The patient underwent full rewarming, discontinuation of cardiopulmonary bypass, and closure of the incision, dictated under separate heading by Dr. Laneta Simmers. DD:  09/17/00 TD:  09/18/00 Job: [id number]

## 2010-08-16 NOTE — Op Note (Signed)
[place]. [facility]  Patient:    [name], [name]                      MRN: [id number] Proc. Date: 05/08/00 Adm. Date:  [id number] Disc. Date: [id number] Attending:  Jackelyn Knife                           Operative Report  PREOPERATIVE DIAGNOSIS:  Lumbar wound infection.  POSTOPERATIVE DIAGNOSIS:  Lumbar wound infection.  OPERATION:  Debridement and packing of wound.  SURGEON:  Izell [place]. Elesa Hacker, M.D.  ANESTHESIA:  Local with IV sedation.  INDICATIONS:  The patient is a 77 year old man who had an uncomplicated elective lumbar decompression about two weeks or so ago and subsequently developed a wound infection.  He as brought in today so the wound could be opened and packed.  DESCRIPTION OF PROCEDURE:  Under local anesthesia with IV sedation, the previous laminectomy site was opened about two-thirds of its extent.  Copious irrigation with Betadine, hydrogen peroxide, and antibiotic solution was carried out, and debridement of tissue likewise was carried out.  There was no large collection of pus and no evidence of an abscess formation anywhere. Subcutaneous sutures were removed.  The wound was packed with Iodoform gauze and 4 x 4s, and arrangements will be made for outpatient care including antibiotics. DD:  05/08/00 TD:  05/09/00 Job: [id number]

## 2010-08-16 NOTE — Consult Note (Signed)
[name], [name]                         ACCOUNT NO.:  [id number]   MEDICAL RECORD NO.:  [id number]                   PATIENT TYPE:  INP   LOCATION:  0478                                 FACILITY:  [facility]   PHYSICIAN:  Gwen Pounds, M.D.                 DATE OF BIRTH:  07-26-1930   DATE OF CONSULTATION:  01/28/2002  DATE OF DISCHARGE:                                   CONSULTATION   PRIMARY CARE PHYSICIAN:  Mark A. [name], M.D.   PRIMARY NEUROSURGEON:  Philip C. Elesa Hacker, M.D.   PRIMARY ORTHOPAEDIC SURGEON:  Ollen Gross, M.D.   PRIMARY VASCULAR SURGEON:  Balinda Quails, M.D.   PRIMARY INFECTIOUS DISEASE PHYSICIAN:  Cliffton Asters, M.D.   CHIEF COMPLAINT:  Non-displaced right femoral neck fracture and preoperative  assessment.   PAST MEDICAL HISTORY:  1. Cleft palate.  2. Recurrent sinusitis.  3. Status post sinus surgery in 1998.  4. Peripheral vascular disease, status post left lower extremity arterial     bypass and abdominal aortic aneurysm stenting.  5. Hypertriglyceridemia.  6. L2 to L5 spinal stenosis.  7. In 4/02, significant methicillin-resistant Staphylococcus aureus sepsis     with severe metastatic methicillin-resistant Staphylococcus aureus.     There was a L2 to L5 epidural abscess, there was a right psoas abscess,     there was a bacterial endocarditis of the aortic valve and mykytic     aneurysms of the aortic arch and descending aorta, as well as     osteomyelitis.  8. He is status post ascending aorta anominate artery bypass with left     common carotid artery bypass and graft placement in 6/02.  Again, he had     a repair of his aortic arch with Rifampin silk polyester graft.  9. Type 2 diabetes mellitus, diet controlled.  Last hemoglobin A1C was 5.3     on 10/27/01.  10.      Hypertension.  11.      Benign prostatic hypertrophy.  12.      Anemia of chronic disease.   HISTORY OF PRESENT ILLNESS:  The patient is a 77 year old male with multiple  medical problems as listed above who was seen by Dr. Waynard Edwards on 01/10/02,  with severe right groin pain.  His treatments included pain medications and  a referral to ortho.  Ever since then, he has become wheelchair bound.  He  was seen by Dr. Lequita Halt today and diagnosed with a non-displaced right  femoral neck fracture, and was admitted to [facility] for open  reduction internal fixation to be done tomorrow.  He currently has no  complaints except for pain in the groin.   SOCIAL HISTORY:  He is a widower.  Two children.  No grandchildren.  Is  retired from [employer].  He does not smoke, although he has a  50 pack year tobacco  history, quit in 4/02.   FAMILY HISTORY:  Congestive heart failure, rheumatoid arthritis, coronary  artery disease, myocardial infarction, diabetes mellitus, Marfan's syndrome,  peripheral vascular disease.   ALLERGIES:  CODEINE.   MEDICATIONS:  1. OxyContin 20 mg p.o. b.i.d.  2. Claritin 10 mg q.d.  3. Hydrocodone 5 mg q.4-6h.  4. Neurontin 500 mg b.i.d.  5. Baby aspirin 81 mg q.d.  6. Amitriptyline 25 mg q.h.s.  7. Triamterene/hydrochlorothiazide 37.5/25 mg q.d.  8. Chromium picolinate 200 mcg q.d.  9. Cartia XT 180 mg p.o. q.d.  10.      Potassium 20 mEq p.o. q.d.  11.      Gemfibrozil 600 mg p.o. b.i.d.  12.      Ambien 10 mg p.o. q.h.s.  13.      Rhinocort aqua use as directed.  14.      Flonase 0.4 mg p.o. q.d.  15.      Iron sulfate 27 mg p.o. q.d.   REVIEW OF SYMPTOMS:  He wears glasses, they are bifocals.  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  He has trouble phonating due to his cleft palate.  He has no  other ears, nose, and throat symptoms currently.  No chest pain, no  shortness of breath, no angina, no coronary disease known, no myocardial  infarction, no orthopnea, no PND, no edema.  No abdominal pain.  He uses  laxatives for bowel movement for constipation.  Worse with the narcotic.  He  denies hematochezia or melena.  He has  been having a tremendous amount of  groin pain, and has been wheelchair bound lately, and again he has not been  able to walk.  All other organ systems are reviewed and currently negative.   PHYSICAL EXAMINATION:  VITAL SIGNS:  Blood pressure between 140/86 to  159/90, heart rate is between 80 and 91, respiratory rate is 16, temperature  is afebrile, saturating 100% on room air.  GENERAL:  Alert and oriented x3.  Poor annunciation secondary to his cleft  palate.  HEENT:  Pupils equal, round, reactive to light and accommodation.  Extraocular movements were intact.  Head is normocephalic, atraumatic.  He  does have a cleft palate and a repaired cleft lip.  Oropharynx is moist.  NECK:  No JVD, no lymphadenopathy.  PULMONARY:  Clear to auscultation bilaterally.  CARDIAC:  Regular with a 1/6 systolic ejection murmur.  There is a midline  sternotomy noted.  ABDOMEN:  Soft, nontender, nondistended, bowel sounds positive.  EXTREMITIES:  No cyanosis, clubbing.  Trace edema bilaterally.  Distal pedal  pulses are 2+ bilaterally.   LABORATORY DATA:  Chest x-ray shows lungs are clear.  No acute disease.  Midline sternotomy noted.  Ascending and descending aortic graft noted.  Hypertrophic bone of the right shoulder.  EKG shows normal sinus rhythm,  left ventricular hypertrophy, and artifact.  There are Q's inferiorly, and  no ischemia.  No new major abnormalities.   Laboratory data includes in office over the last two weeks, a reticulocyte  count of 3.4%, ferratin level of 349, B12 of 338, transferrin 304, iron 118,  UIBC 310, TIBC 428, percent saturation of 27.6.  Hemoglobin 11.4 on  01/12/02.  Here today in the hospital, PTT is 27, INR 1.1.  Sodium 138,  potassium 3.8, chloride 107, bicarbonate 24, BUN 28, creatinine 1.2, glucose  197.  Alkaline phosphatase 315.  The rest of the liver function tests are within normal limits.  CBC within normal limits except  for the hemoglobin  remains stable at  11.2.   ASSESSMENT:  This is a 77 year old white male with multiple medical problems  being admitted for right non-displaced femoral fracture going for surgery  hemiarthroplasty tomorrow.  This patient has tolerated much more invasive  cardiovascular surgery in the last 1-1/2 years, and because this is a non-  vascular surgery, I see no reason to delay.  He does carry a higher  morbidity and mortality secondary to his complicated medical history.   PLAN:  1. Okay to proceed to hemiarthroplasty tomorrow.  2. Pain control with traction as ordered per ortho.  3. Stool softeners secondary to narcotics.  4. Control blood pressure.  5. Diabetes mellitus, will check CBG's and place on a sliding scale.  6. Deep venous thrombosis prophylaxis ordered.  7. Home medications as able.  8. Anemia of chronic disease, stable.  9. Our service will follow with you, Dr. Waynard Edwards will be on call over the     weekend.  10.      Antibiotics for surgery a must.                                                 Gwen Pounds, M.D.    JMR/MEDQ  D:  01/28/2002  T:  01/29/2002  Job:  [id number]   cc:   Loraine Leriche A. [name], M.D.   Jeffrey L. Elesa Hacker, M.D.  [facility]. [facility] [place]  [place] [address]  Fax: [phone]   P. Liliane Bade, M.D.  [address]  [place]  [place] [address]  Fax: [phone]   Cliffton Asters, M.D.  [address] [place]  [place] [address]  Fax: [phone]

## 2010-08-16 NOTE — Op Note (Signed)
[place]. [facility]  Patient:    [name], [name]                      MRN: [id number] Proc. Date: 07/29/00 Adm. Date:  [id number] Attending:  Rodrigo Ran A                           Operative Report  PREOPERATIVE DIAGNOSIS:  Postoperative epidural abscess.  POSTOPERATIVE DIAGNOSIS:  Postoperative epidural abscess.  OPERATION PERFORMED:  L2 through S1 decompressive laminectomy with epidural exploration; re-exploration of the interspace at L3-4; copious antibiotic irrigation.  SURGEON:  Izell Groesbeck. Elesa Hacker, M.D.  ASSISTANT:  Clydene Fake, M.D.  ANESTHESIA:  INDICATIONS FOR PROCEDURE:  The patient is a 77 year old man who underwent surgery for spinal stenosis on the right side at L2-3 and L3-4 back in January of 2002.  He initially did well but then developed what appeared to be a subcutaneous wound infection.  He was readmitted and taken back to the operating room and the wound was opened down to the fascia and irrigated.  He was treated for six weeks at that point with IV antibiotics with a finding of a methicillin resistant Staphylococcus.  The wound gradually closed and he was doing well and indeed had returned back to work at his part time job.  About two days prior to his readmission which was now about 10 days ago, he redeveloped low back pain and a CBC was done with a sedimentation rate and he was scheduled for a repeat MRI.  He suddenly worsened and had what in retrospect was bacteremia and became septic.  He was readmitted with a finding on MRI of epidural abscess formation and diskitis.  He was treated with IV antibiotics and rescanned but the repeat scan did not show sufficient improvement.  He was therefore taken to the operating room for decompression and drainage described below.  DESCRIPTION OF PROCEDURE:  Under general endotracheal anesthesia, the old incision was excised and the wound extended sufficient to expose the L2 through the  sacrum.  A bilateral decompressive laminectomy was done.  As soon as the fascia was opened at about the L4-5 level, purulent material was noted to come through the interlaminar space.  This was consistent with findings of the lower lumbosacral area as seen on MRI.  Care was taken to protect the dura throughout the procedure, so as not to spread organisms intradurally.  A bilateral decompressive laminectomy was carried out including L2 through L5, in other words L2 down to the sacrum.  Exploration of the anterior epidural space was carried out at each level.  At L3-4, the interspace itself was re-entered and found to be empty and contained only a very small amount of purulent material.  This was irrigated copiously with vancomycin containing irrigant.  The epidural space was explored at all levels and again at L3-4, there was ventral dural material present which was drained.  Irrigation was carried out with ____________ through, up to the L1-L2 level.  The lateral musculature was probed on the right side but no abscess cavity was found. Cultures were taken and a Gram stain was done which showed monocytes and a few white cells and a very rare gram positive cocci.  The wound was again copiously irrigated after which the fascia was closed loosely and the skin likewise was approximated.  Sterile dressing was applied and the patient was taken to the recovery  room in good condition. DD:  07/29/00 TD:  07/30/00 Job: [id number]

## 2010-08-16 NOTE — H&P (Signed)
[place]. [facility]  Patient:    [name], [name] Visit Number: [id number] MRN: [id number]          Service Type: MED Location: [phone] Attending Physician:  Ezequiel Kayser Dictated by:   Rodrigo Ran, M.D. Admit Date:  03/02/2001   CC:         Denman George, M.D.  Dewayne Shorter, M.D.   History and Physical  CHIEF COMPLAINT: Low back pain.  PRIMARY CARE PHYSICIAN: Rodrigo Ran, M.D.  CV/TS: Denman George, M.D. and Alleen Borne, M.D.  INFECTIOUS DISEASE: Dewayne Shorter, M.D.  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  He received 100 days of vancomycin, Synercid, and Rifampin.  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  Furthermore, he had multiple other metastatic types of infection including both hands, which required debridement, and the psoas muscles bilaterally.  [name] did improve clinically and became afebrile.  He convalesced in a nursing home.  He has had chronic back pain ever since the previous infection; however, at the time of discharge from the hospital he had significant pain radiating down his left lower extremity and this has since disappeared.  He was seen for a recent office visit, at which time his C-reactive protein and sedimentation rate were noted to be significantly elevated.  He was sent to infectious disease for further evaluation.  CT scan done of the lumbar spine shows postoperative changes with cortical destruction, but there has also been noted an increase in apparent fluid collection anterior to the L4-L5 interspace and to the right lateral aspect of the L4-L5 interspace which is possibly representing some recurrent abscess.  Furthermore,  there is a small fluid collection 1 x 3 cm adjacent to the left posterolateral aspect of the L5 vertebral body in the L5 pedicle. Furthermore, there is L5 vertebral body cortical destruction and some in the L4 vertebral body, and other signs consistent with chronic diskitis and osteomyelitis at that level.  Given these findings he is admitted for further evaluation and possible treatment.  PAST MEDICAL HISTORY:  1. Cleft palate.  2. Recurrent sinusitis and history of sinus surgery in 1998.  3. Peripheral vascular disease with history of left lower extremity artery     bypass remotely.  4. History of hypertriglyceridemia.  5. History of spinal stenosis, status post lumbar laminectomy in January     2002, which gave him significant symptomatic improvement but then he     developed subsequent epidural abscess and the metastatic MRSA infection     as described above.  6. Type 2 diabetes mellitus.  7. Hypertension.  8. Chronic low back pain.  ALLERGIES:  1. CODEINE.  2. Possibly RESTORIL.  MEDICATIONS:  1. OxyContin 20 mg b.i.d.  2. Cartia XT 180 mg p.o. q.d.  3. Claritin 10 mg p.o. q.d.  4. Potassium 20 mEq q.d.  5. Hydrocodone two tablets q.6h.  6. Protonix 40 mg q.d.  He was actually taken off this three weeks ago and     has been doing well without it.  7. Gemfibrozil 600 mg p.o. b.i.d.  8. Neurontin 300 mg p.o. t.i.d.  9. Insulin N 12 units q.a.m. and 7 units q.p.m. before supper. 10.  He does take chromium vitamin. 11. He does take Astelin one spray each nostril b.i.d. 12. Fiber pill.  He has not been taking aspirin recently for an unknown reason.  He did not have any intolerance of aspirin as far as we know.  SOCIAL HISTORY: He lives at home now with his son, Minerva Areola.  He does have a lady who stays with him during the day.  He is able to ambulate without a walker in the home but he does sometimes use a walker if he is going to walk longer distances.  He quit tobacco  earlier this year but has a 50-pack-year smoking history from previous times.  He has been a widower since 62.  He has no history of alcohol or drug use, current or past.  FAMILY HISTORY: Father died at age 81 of CHF and rheumatoid arthritis.  Mother died at age 45 of myocardial infarction.  He has one sister alive with diabetes.  He has two children, both healthy although his son did have a "triple A" repaired and has a history of Marfan syndrome.  REVIEW OF SYSTEMS: [name] has had continued low back pain, which he does feel has been gradually worsening over the last three to four weeks.  He denies any fever, chills, nausea, vomiting, diarrhea, or constipation.  He denies any blood from above or below.  He has no cough or wheeze.  He does have some rare exertional shortness of breath but no orthopnea or edema.  He occasionally has had some shifting pains in his neck, sometimes on the left and sometimes on the right but nothing persistent.  PHYSICAL EXAMINATION:  VITAL SIGNS: Temperature 97.6 degrees, pulse 93, respiratory rate 20, blood pressure 142/76.  O2 saturation 99% on room air.  GENERAL: He is nontoxic-appearing.  He is in no acute distress.  He looks well overall and much improved from his previous hospitalization.  NECK: No JVD.  HEENT: No icterus.  LUNGS: Clear to auscultation bilaterally.  HEART: Regular with no murmur.  ABDOMEN: Benign.  EXTREMITIES: No clubbing, cyanosis, or edema.  SKIN: All scars are well-healed.  NEUROLOGIC: Alert and oriented x 4 and appropriate.  LABORATORY DATA: There are no new laboratories; however, he did have a CT scan done recently with the results summarized as above.  His last hemoglobin A1C was on January 20, 2001 and was 4.9%.  Sedimentation rate was 95 on January 20, 2001 with a cardio CRP of 9.4.  He had two blood  cultures sent three weeks ago at [facility] but the results of these are not known to use at this time and  will need to be followed up.  WBC 5.2 with 69% segs, 21% lymphocytes, 9% monocytes; hemoglobin 11.1 (which is up from his discharge hemoglobin, which was in the 10s in July 2002); platelet count 188,000.  Sodium 138, potassium 4.3, chloride 102, CO2 27, BUN 23, creatinine 1.3, glucose 134 on January 21, 2001.  Calcium was 9.9.  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  We appreciate the infectious disease consultation and direction in this case. New blood cultures have been ordered.  He will be considered for possible aspiration of the lumbar fluid collections to see is these are sterile or infected.  Dr. Elesa Hacker of neurosurgery has been made aware by infectious disease.  We will notify Dr. Madilyn Fireman of CV/TS for courtesy purposes. Dictated by:   Rodrigo Ran, M.D. Attending Physician:  Ezequiel Kayser DD:  03/02/01 TD:  03/03/01 Job: [id number]

## 2010-12-03 ENCOUNTER — Other Ambulatory Visit (INDEPENDENT_AMBULATORY_CARE_PROVIDER_SITE_OTHER): Payer: Medicare Other | Admitting: Vascular Surgery

## 2010-12-03 DIAGNOSIS — I6529 Occlusion and stenosis of unspecified carotid artery: Secondary | ICD-10-CM

## 2010-12-03 DIAGNOSIS — Z48812 Encounter for surgical aftercare following surgery on the circulatory system: Secondary | ICD-10-CM

## 2010-12-03 NOTE — Progress Notes (Unsigned)
Study at [facility] performed 12/03/2010

## 2010-12-20 ENCOUNTER — Encounter: Payer: Self-pay | Admitting: Vascular Surgery

## 2010-12-20 NOTE — Procedures (Unsigned)
CAROTID DUPLEX EXAM  INDICATION:  Followup carotid stenosis.  HISTORY: Diabetes:  Yes. Cardiac:  No. Hypertension:  Yes. Smoking:  Previous. Previous Surgery:  Innominate and aortic arch replacement 09/11/2000. CV History:  Asymptomatic. Amaurosis Fugax No, Paresthesias No, Hemiparesis No                                      RIGHT             LEFT Brachial systolic pressure:         148               162 Brachial Doppler waveforms:         WNL               WNL Vertebral direction of flow:        Antegrade         Antegrade DUPLEX VELOCITIES (cm/sec) CCA peak systolic                   106               Occluded ECA peak systolic                   215               48-retrograde ICA peak systolic                   225               146 ICA end diastolic                   70                46 PLAQUE MORPHOLOGY:                  Heterogeneous     Heterogeneous PLAQUE AMOUNT:                      Moderate to severe                  Moderate PLAQUE LOCATION:                    CCA, ICA, ECA     ICA, ECA  IMPRESSION: 1. Right internal carotid artery stenosis in the 60%-79% range. 2. Left common carotid artery is occluded. 3. Left external carotid artery is retrograde and feeds the left     internal carotid artery. 4. Left internal carotid artery stenosis in the 40%-59% range.  This     may be overestimated due to compensatory flow and occluded left     common carotid artery. 5. Bilateral internal carotid artery disease has increased since study     on 04/26/2010.  ___________________________________________ Janetta Hora. Fields, MD  SH/MEDQ  D:  12/03/2010  T:  12/03/2010  Job:  [id number]

## 2011-06-20 ENCOUNTER — Ambulatory Visit (INDEPENDENT_AMBULATORY_CARE_PROVIDER_SITE_OTHER): Payer: Medicare Other | Admitting: *Deleted

## 2011-06-20 DIAGNOSIS — I6529 Occlusion and stenosis of unspecified carotid artery: Secondary | ICD-10-CM

## 2011-06-30 ENCOUNTER — Other Ambulatory Visit: Payer: Self-pay | Admitting: *Deleted

## 2011-06-30 DIAGNOSIS — I6529 Occlusion and stenosis of unspecified carotid artery: Secondary | ICD-10-CM

## 2011-07-01 ENCOUNTER — Encounter: Payer: Self-pay | Admitting: Vascular Surgery

## 2011-07-01 NOTE — Procedures (Unsigned)
CAROTID DUPLEX EXAM  INDICATION:  Followup carotid artery disease  HISTORY: Diabetes:  Yes Cardiac:  No Hypertension:  Yes Smoking:  Previous Previous Surgery:  Innominate and aortic arch replacement 09/11/2000 CV History:  Currently asymptomatic Amaurosis Fugax No, Paresthesias No, Hemiparesis No                                      RIGHT             LEFT Brachial systolic pressure:         150               158 Brachial Doppler waveforms:         Normal            Normal Vertebral direction of flow:        Antegrade         Antegrade DUPLEX VELOCITIES (cm/sec) CCA peak systolic                   99                Known occlusion ECA peak systolic                   237               34-retrograde ICA peak systolic                   200               48 ICA end diastolic                   61                22 PLAQUE MORPHOLOGY:                  Mixed             Mixed PLAQUE AMOUNT:                      Moderate/severe   Moderate PLAQUE LOCATION:                    CCA, ICA, ECA     ICA, ECA  IMPRESSION: 1. Right internal carotid artery velocity suggests 60%-79% stenosis     (low end of range). 2. Right external carotid artery stenosis. 3. Known left common carotid artery occlusion. 4. Left external carotid artery is retrograde and feeds the left     internal carotid artery. 5. Left internal carotid artery velocity suggests 1%-39% stenosis. 6. Antegrade vertebral arteries bilaterally.  ___________________________________________ Janetta Hora Fields, MD  EM/MEDQ  D:  06/20/2011  T:  06/20/2011  Job:  [id number]

## 2011-07-25 ENCOUNTER — Encounter (HOSPITAL_COMMUNITY): Admission: RE | Admit: 2011-07-25 | Payer: Medicare Other | Source: Ambulatory Visit

## 2011-08-06 ENCOUNTER — Encounter (HOSPITAL_COMMUNITY): Payer: Self-pay

## 2011-08-06 ENCOUNTER — Encounter (HOSPITAL_COMMUNITY)
Admission: RE | Admit: 2011-08-06 | Discharge: 2011-08-06 | Disposition: A | Discharge status: Home / Self Care | Payer: Medicare Other | Source: Ambulatory Visit | Attending: Internal Medicine | Admitting: Internal Medicine

## 2011-08-06 DIAGNOSIS — M810 Age-related osteoporosis without current pathological fracture: Secondary | ICD-10-CM | POA: Insufficient documentation

## 2011-08-06 HISTORY — DX: Essential (primary) hypertension: I10

## 2011-08-06 HISTORY — DX: Gastro-esophageal reflux disease without esophagitis: K21.9

## 2011-08-06 HISTORY — DX: Pure hypercholesterolemia, unspecified: E78.00

## 2011-08-06 HISTORY — DX: Other seasonal allergic rhinitis: J30.2

## 2011-08-06 HISTORY — DX: Age-related osteoporosis without current pathological fracture: M81.0

## 2011-08-06 MED ORDER — ZOLEDRONIC ACID 5 MG/100ML IV SOLN
5.0000 mg | Freq: Once | INTRAVENOUS | Status: AC
  Administered 2011-08-06: 5 mg via INTRAVENOUS
  Filled 2011-08-06: qty 100

## 2011-08-06 MED ORDER — SODIUM CHLORIDE 0.9 % IV SOLN
INTRAVENOUS | Status: AC
  Administered 2011-08-06: 13:00:00 via INTRAVENOUS

## 2011-08-06 NOTE — Discharge Instructions (Signed)
Zoledronic Acid injection (Paget's Disease, Osteoporosis) What is this medicine? ZOLEDRONIC ACID (ZOE le dron ik AS id) lowers the amount of calcium loss from bone. It is used to treat Paget's disease and osteoporosis in women. This medicine may be used for other purposes; ask your health care provider or pharmacist if you have questions. What should I tell my health care provider before I take this medicine? They need to know if you have any of these conditions: -aspirin-sensitive asthma -dental disease -kidney disease -low levels of calcium in the blood -past surgery on the parathyroid gland or intestines -an unusual or allergic reaction to zoledronic acid, other medicines, foods, dyes, or preservatives -pregnant or trying to get pregnant -breast-feeding How should I use this medicine? This medicine is for infusion into a vein. It is given by a health care professional in a hospital or clinic setting. Talk to your pediatrician regarding the use of this medicine in children. This medicine is not approved for use in children. Overdosage: If you think you have taken too much of this medicine contact a poison control center or emergency room at once. NOTE: This medicine is only for you. Do not share this medicine with others. What if I miss a dose? It is important not to miss your dose. Call your doctor or health care professional if you are unable to keep an appointment. What may interact with this medicine? -certain antibiotics given by injection -NSAIDs, medicines for pain and inflammation, like ibuprofen or naproxen -some diuretics like bumetanide, furosemide -teriparatide This list may not describe all possible interactions. Give your health care provider a list of all the medicines, herbs, non-prescription drugs, or dietary supplements you use. Also tell them if you smoke, drink alcohol, or use illegal drugs. Some items may interact with your medicine. What should I watch for while  using this medicine? Visit your doctor or health care professional for regular checkups. It may be some time before you see the benefit from this medicine. Do not stop taking your medicine unless your doctor tells you to. Your doctor may order blood tests or other tests to see how you are doing. Women should inform their doctor if they wish to become pregnant or think they might be pregnant. There is a potential for serious side effects to an unborn child. Talk to your health care professional or pharmacist for more information. You should make sure that you get enough calcium and vitamin D while you are taking this medicine. Discuss the foods you eat and the vitamins you take with your health care professional. Some people who take this medicine have severe bone, joint, and/or muscle pain. This medicine may also increase your risk for a broken thigh bone. Tell your doctor right away if you have pain in your upper leg or groin. Tell your doctor if you have any pain that does not go away or that gets worse. What side effects may I notice from receiving this medicine? Side effects that you should report to your doctor or health care professional as soon as possible: -allergic reactions like skin rash, itching or hives, swelling of the face, lips, or tongue -breathing problems -changes in vision -feeling faint or lightheaded, falls -jaw burning, cramping, or pain -muscle cramps, stiffness, or weakness -trouble passing urine or change in the amount of urine Side effects that usually do not require medical attention (report to your doctor or health care professional if they continue or are bothersome): -bone, joint, or muscle pain -fever -  irritation at site where injected -loss of appetite -nausea, vomiting -stomach upset -tired This list may not describe all possible side effects. Call your doctor for medical advice about side effects. You may report side effects to FDA at 1-800-FDA-1088. Where  should I keep my medicine? This drug is given in a hospital or clinic and will not be stored at home. NOTE: This sheet is a summary. It may not cover all possible information. If you have questions about this medicine, talk to your doctor, pharmacist, or health care provider.  2012, [employer] Standard. (09/13/2010 9:08:15 AM) 

## 2011-09-25 ENCOUNTER — Emergency Department (HOSPITAL_COMMUNITY): Payer: Medicare Other

## 2011-09-25 ENCOUNTER — Encounter (HOSPITAL_COMMUNITY): Payer: Self-pay | Admitting: Emergency Medicine

## 2011-09-25 ENCOUNTER — Emergency Department (HOSPITAL_COMMUNITY)
Admission: EM | Admit: 2011-09-25 | Discharge: 2011-09-25 | Disposition: A | Discharge status: Home / Self Care | Payer: Medicare Other | Attending: Emergency Medicine | Admitting: Emergency Medicine

## 2011-09-25 DIAGNOSIS — J320 Chronic maxillary sinusitis: Secondary | ICD-10-CM | POA: Insufficient documentation

## 2011-09-25 DIAGNOSIS — I10 Essential (primary) hypertension: Secondary | ICD-10-CM | POA: Insufficient documentation

## 2011-09-25 DIAGNOSIS — R51 Headache: Secondary | ICD-10-CM | POA: Insufficient documentation

## 2011-09-25 DIAGNOSIS — R05 Cough: Secondary | ICD-10-CM | POA: Insufficient documentation

## 2011-09-25 DIAGNOSIS — E119 Type 2 diabetes mellitus without complications: Secondary | ICD-10-CM | POA: Insufficient documentation

## 2011-09-25 DIAGNOSIS — R059 Cough, unspecified: Secondary | ICD-10-CM | POA: Insufficient documentation

## 2011-09-25 HISTORY — DX: Methicillin resistant Staphylococcus aureus infection, unspecified site: A49.02

## 2011-09-25 MED ORDER — AMOXICILLIN-POT CLAVULANATE 875-125 MG PO TABS: 1.0000 | ORAL_TABLET | Freq: Two times a day (BID) | ORAL | Status: AC

## 2011-09-25 NOTE — Discharge Instructions (Signed)

## 2011-09-25 NOTE — ED Provider Notes (Signed)
Medical screening examination/treatment/procedure(s) were performed by non-physician practitioner and as supervising physician I was immediately available for consultation/collaboration.   Nakoa Ganus, MD 09/25/11 [id number] 

## 2011-09-25 NOTE — ED Notes (Signed)
Since apr 25th, has had sinus infection- on amoxclave; pt reports it has been getting worse- pt reports being on several different abx; unsure if running fevers, but reports feeling hot, denies chills, pt denies sore throat; reports phelgm but unable to get up; pt has taken rounds of mucinex, but not on currently

## 2011-09-25 NOTE — ED Provider Notes (Signed)
History     CSN: [id number]  Arrival date & time 09/25/11  1941   First MD Initiated Contact with Patient 09/25/11 2010      Chief Complaint  Patient presents with  . Recurrent Sinusitis    (Consider location/radiation/quality/duration/timing/severity/associated sxs/prior treatment) HPI Comments: Patient here with continued sinus pain and pressure for the past several months since April 25th - states that he has seen his PCP numerous times and has been on several different antibiotics the most recent starting on Saturday with augmentin.  He reports that he has the sensation that this is getting worse - he has an appointment to see [facility] ENT on 9th of July but states that he is concerned that this may be MRSA sinusitis.  He has been trying mucinex as well and does not feel like he can get up the mucous.  Patient is a 78 y.o. male presenting with URI. The history is provided by the patient and a relative. No language interpreter was used.  URI The primary symptoms include headaches. Primary symptoms do not include fever, fatigue, ear pain, sore throat, swollen glands, cough, wheezing, abdominal pain, nausea, vomiting, myalgias, arthralgias or rash. The current episode started more than 1 week ago. This is a chronic problem. The problem has been gradually worsening.  Symptoms associated with the illness include facial pain, sinus pressure and congestion. The illness is not associated with chills, plugged ear sensation or rhinorrhea. Risk factors for severe complications from URI include being elderly.    Past Medical History  Diagnosis Date  . Diabetes mellitus   . Hypertension   . GERD (gastroesophageal reflux disease)   . Osteoporosis, senile   . High cholesterol   . Seasonal allergies   . MRSA infection     Past Surgical History  Procedure Date  . Back surgery   . Cleft lip repair     History reviewed. No pertinent family history.  History  Substance Use Topics  .  Smoking status: Former Smoker    Quit date: 09/24/2000  . Smokeless tobacco: Not on file  . Alcohol Use: No      Review of Systems  Constitutional: Negative for fever, chills and fatigue.  HENT: Positive for congestion and sinus pressure. Negative for ear pain, sore throat and rhinorrhea.   Respiratory: Negative for cough and wheezing.   Gastrointestinal: Negative for nausea, vomiting and abdominal pain.  Musculoskeletal: Negative for myalgias and arthralgias.  Skin: Negative for rash.  Neurological: Positive for headaches.  All other systems reviewed and are negative.    Allergies  Codeine  Home Medications   Current Outpatient Rx  Name Route Sig Dispense Refill  . ASPIRIN EC 81 MG PO TBEC Oral Take 81 mg by mouth daily.    . ATORVASTATIN CALCIUM 40 MG PO TABS Oral Take 40 mg by mouth daily.    Marland Kitchen BENAZEPRIL HCL 10 MG PO TABS Oral Take 10 mg by mouth daily.    Marland Kitchen CARVEDILOL 6.25 MG PO TABS Oral Take 6.25 mg by mouth 2 (two) times daily with a meal.    . VITAMIN D 1000 UNITS PO TABS Oral Take 1,000 Units by mouth daily.    . DESLORATADINE 5 MG PO TABS Oral Take 5 mg by mouth daily.    Marland Kitchen DILTIAZEM HCL ER COATED BEADS 240 MG PO CP24 Oral Take 240 mg by mouth daily.    Marland Kitchen DIPHENHYDRAMINE-APAP (SLEEP) 25-500 MG PO TABS Oral Take 1 tablet by mouth at bedtime as  needed. For sleep    . DOXAZOSIN MESYLATE 2 MG PO TABS Oral Take 2 mg by mouth at bedtime.    Marland Kitchen FERROUS SULFATE 325 (65 FE) MG PO TABS Oral Take 325 mg by mouth daily with breakfast.    . OMEGA-3 FATTY ACIDS 1000 MG PO CAPS Oral Take 2 g by mouth daily.    Marland Kitchen FLUTICASONE PROPIONATE 50 MCG/ACT NA SUSP Nasal Place 2 sprays into the nose daily.    . FUROSEMIDE 20 MG PO TABS Oral Take 20 mg by mouth 2 (two) times daily.    Marland Kitchen GLIMEPIRIDE 2 MG PO TABS Oral Take 2 mg by mouth daily before breakfast.    . HYDROCODONE-ACETAMINOPHEN 5-500 MG PO TABS Oral Take 1 tablet by mouth every 6 (six) hours as needed. For pain    . HYPROMELLOSE  2.5 % OP SOLN Both Eyes Place 1 drop into both eyes daily as needed.    Marland Kitchen LORATADINE 10 MG PO TABS Oral Take 10 mg by mouth daily.    . MULTIVITAMINS PO CAPS Oral Take 1 capsule by mouth daily.    Marland Kitchen NIACIN ER 500 MG PO TBCR Oral Take 500 mg by mouth 2 (two) times daily with a meal.    . OMEPRAZOLE 20 MG PO CPDR Oral Take 20 mg by mouth daily.    Marland Kitchen POLYETHYLENE GLYCOL 3350 PO PACK Oral Take 17 g by mouth daily.    Marland Kitchen TEMAZEPAM 30 MG PO CAPS Oral Take 30 mg by mouth at bedtime as needed. For sleep      BP 172/89  Pulse 104  Temp 98.3 F (36.8 C) (Oral)  Resp 20  SpO2 99%  Physical Exam  Nursing note and vitals reviewed. Constitutional: He is oriented to person, place, and time. He appears well-developed and well-nourished. No distress.  HENT:  Head: Normocephalic and atraumatic.  Right Ear: External ear normal.  Left Ear: External ear normal.  Mouth/Throat: Oropharynx is clear and moist. No oropharyngeal exudate.       Bilateral maxillary sinus pain - patient with muffled voice  Eyes: Conjunctivae are normal. Pupils are equal, round, and reactive to light. No scleral icterus.  Neck: Normal range of motion. Neck supple.  Cardiovascular: Normal rate, regular rhythm and normal heart sounds.  Exam reveals no gallop and no friction rub.   No murmur heard. Pulmonary/Chest: Effort normal and breath sounds normal. No respiratory distress. He has no wheezes. He has no rales. He exhibits no tenderness.  Abdominal: Soft. Bowel sounds are normal. He exhibits no distension. There is no tenderness.  Musculoskeletal: He exhibits no edema and no tenderness.  Lymphadenopathy:    He has no cervical adenopathy.  Neurological: He is alert and oriented to person, place, and time. No cranial nerve deficit. He exhibits normal muscle tone. Coordination normal.  Skin: Skin is warm and dry. No rash noted. No erythema. No pallor.  Psychiatric: He has a normal mood and affect. His behavior is normal. Judgment  and thought content normal.    ED Course  Procedures (including critical care time)  Labs Reviewed - No data to display No results found.  Results for orders placed during the hospital encounter of 08/23/09  GLUCOSE, CAPILLARY      Component Value Range   Glucose-Capillary 131 (*) 70 - 99 mg/dL   Ct Maxillofacial  Ltd Wo Cm  09/25/2011  *RADIOLOGY REPORT*  Clinical Data:  78 year old male with sinus infection.  Productive cough.  CT LIMITED SINUSES WITHOUT  CONTRAST  Technique:  Multidetector CT images of the paranasal sinuses were obtained in a single plane without contrast.  Comparison:  CTA neck 05/30/2010.  Findings:  Abnormal hypodensity in the right occipital lobe in the visualized brain is, the need by a dilatation of the right occipital horn, favor chronic infarct.  This level was not included on the comparison. Calcified atherosclerosis at the skull base.  Visualized orbit, scalp, and deep soft tissue spaces of the face are within normal limits.  The sphenoid sinuses are clear. The ethmoid air cells are clear except for minimal mucosal thickening anteriorly on the right. The right frontal sinus did not develop, the left is clear. The left maxillary sinus is clear with sequelae of previous antrostomy. There is bubbly and layering opacity in the right maxillary sinus.  Chronic deformity of the right maxilla alveolar process is stable. No acute osseous abnormality identified.  IMPRESSION: 1. Mild right maxillary sinus inflammatory changes with fluid level and bubbly opacity compatible with acute sinusitis in the appropriate clinical setting. 2.  Other paranasal sinuses are negative. 3.  Evidence of chronic right PCA infarct.  Original Report Authenticated By: Harley Hallmark, M.D.     Right maxillary sinusitis   MDM  There is no evidence of polyp or growth to explain the chronicity of the sinusitis - the patient has an appointment with ENT on the 9th of July and is encouraged to keep that  appointment was placed on 10 days worth of augmentin, so I will add 4 more days.          Izola Price [place], [place] 09/25/11 2127

## 2011-10-09 ENCOUNTER — Telehealth: Payer: Self-pay | Admitting: Vascular Surgery

## 2011-10-09 NOTE — Telephone Encounter (Signed)
Message copied by Fredrich Birks on Thu Oct 09, 2011  9:26 AM ------      Message from: Lorin Mercy K      Created: Thu Oct 09, 2011  8:09 AM      Regarding: schedule       I changed the date on the existing carotid duplex order.            ----- Message -----         From: Erenest Blank, RN         Sent: 10/08/2011   4:43 PM           To: Sharee Pimple, CMA, [facility]            Needs his appt. Moved-up for ov w/ Rusty and duplex of carotid w/in 1-2 wks./ it would be ideal to sched. On a day when CEF here if poss.      ----- Message -----         From: Sherren Kerns, MD         Sent: 10/08/2011   3:18 PM           To: Conley Simmonds Pullins, RN            He should be moved up.  Dr Pollyann Kennedy saw the he had a stroke on eval for sinusitis.  This is  Probably chronic but we should schedule duplex and Rusty in 1-2 weeks            Leonette Most      ----- Message -----         From: Erenest Blank, RN         Sent: 10/08/2011   2:16 PM           To: Sherren Kerns, MD            His last duplex was 06/20/11; next ov and carotid duplex is 12/16/11 with Rusty.            ----- Message -----         From: Sherren Kerns, MD         Sent: 10/08/2011  12:42 PM           To: Conley Simmonds Pullins, RN            Can you find out when Nigel Berthold is due for follow up?            Leonette Most

## 2011-10-09 NOTE — Telephone Encounter (Signed)
Spoke with patient to schedule 10/23/11 @ 1:30pm.

## 2011-10-22 ENCOUNTER — Encounter: Payer: Self-pay | Admitting: Neurosurgery

## 2011-10-23 ENCOUNTER — Encounter: Payer: Self-pay | Admitting: Neurosurgery

## 2011-10-23 ENCOUNTER — Other Ambulatory Visit (INDEPENDENT_AMBULATORY_CARE_PROVIDER_SITE_OTHER): Payer: Medicare Other | Admitting: *Deleted

## 2011-10-23 ENCOUNTER — Ambulatory Visit (INDEPENDENT_AMBULATORY_CARE_PROVIDER_SITE_OTHER): Payer: Medicare Other | Admitting: Neurosurgery

## 2011-10-23 VITALS — BP 139/60 | HR 67 | Resp 14 | Ht 64.0 in | Wt 155.0 lb

## 2011-10-23 DIAGNOSIS — I6529 Occlusion and stenosis of unspecified carotid artery: Secondary | ICD-10-CM

## 2011-10-23 NOTE — Progress Notes (Addendum)
[name] Note  CC: Six-month carotid followup for surveillance Referring Physician: Fields  History of Present Illness: 78 year old male [name] of Dr. Darrick Penna followed for known carotid stenosis. The [name] was seen in emergency department for sinusitis at the end of June at which time they obtained CT scans which were read by Dr. Augusto Gamble, results as follows:CT LIMITED SINUSES WITHOUT CONTRAST Technique: Multidetector CT images of the paranasal sinuses were obtained in a single plane without contrast. Comparison: CTA neck 05/30/2010. Findings: Abnormal hypodensity in the right occipital lobe in the visualized brain is, the need by a dilatation of the right occipital horn, favor chronic infarct. This level was not included on the comparison. Calcified atherosclerosis at the skull base. Visualized orbit, scalp, and deep soft tissue spaces of the face are within normal limits. The sphenoid sinuses are clear. The ethmoid air cells are clear except for minimal mucosal thickening anteriorly on the right. The right frontal sinus did not develop, the left is clear. The left maxillary sinus is clear with sequelae of previous antrostomy. There is bubbly and layering opacity in the right maxillary sinus. Chronic deformity of the right maxilla alveolar process is stable. No acute osseous abnormality identified. IMPRESSION: 1. Mild right maxillary sinus inflammatory changes with fluid level and bubbly opacity compatible with acute sinusitis in the appropriate clinical setting. 2. Other paranasal sinuses are negative. 3. Evidence of chronic right PCA infarct. Original Report Authenticated By: Harley Hallmark, M.D.   The [name] has a long vascular history as well as a complicated medical history but currently denies acute signs or symptoms of CVA, TIA, amaurosis fugax, however the [name] states he has had "blurred" vision from time to time in both eyes.  Past Medical  History  Diagnosis Date  . Diabetes mellitus   . Hypertension   . GERD (gastroesophageal reflux disease)   . Osteoporosis, senile   . High cholesterol   . Seasonal allergies   . MRSA infection     ROS: [x]  Positive   [ ]  Denies    General: [ ]  Weight loss, [ ]  Fever, [ ]  chills Neurologic: [ ]  Dizziness, [ ]  Blackouts, [ ]  Seizure [ ]  Stroke, [ ]  "Mini stroke", [ ]  Slurred speech, [x ] Temporary blindness; [ ]  weakness in arms or legs, [ ]  Hoarseness Cardiac: [ ]  Chest pain/pressure, [ ]  Shortness of breath at rest [ x] Shortness of breath with exertion, [ ]  Atrial fibrillation or irregular heartbeat Vascular: [ ]  Pain in legs with walking, [ ]  Pain in legs at rest, [ ]  Pain in legs at night,  [ ]  Non-healing ulcer, [ ]  Blood clot in vein/DVT,   Pulmonary: [ ]  Home oxygen, [ ]  Productive cough, [ ]  Coughing up blood, [ ]  Asthma,  [ ]  Wheezing Musculoskeletal:  [ ]  Arthritis, [ ]  Low back pain, [ ]  Joint pain Hematologic: [ ]  Easy Bruising, [ ]  Anemia; [ ]  Hepatitis Gastrointestinal: [ ]  Blood in stool, [ ]  Gastroesophageal Reflux/heartburn, [ ]  Trouble swallowing Urinary: [ ]  chronic Kidney disease, [ ]  on HD - [ ]  MWF or [ ]  TTHS, [ ]  Burning with urination, [ ]  Difficulty urinating Skin: [ ]  Rashes, [ ]  Wounds Psychological: [ ]  Anxiety, [ ]  Depression   Social History History  Substance Use Topics  . Smoking status: Former Smoker    Quit date: 09/24/2000  . Smokeless tobacco: Not on file  . Alcohol Use: No  Family History No family history on file.  Allergies  Allergen Reactions  . Codeine Nausea And Vomiting    Current Outpatient Prescriptions  Medication Sig Dispense Refill  . aspirin EC 81 MG tablet Take 81 mg by mouth daily.      Marland Kitchen atorvastatin (LIPITOR) 40 MG tablet Take 40 mg by mouth daily.      . benazepril (LOTENSIN) 10 MG tablet Take 10 mg by mouth daily.      . carvedilol (COREG) 6.25 MG tablet Take 6.25 mg by mouth 2 (two) times daily with a meal.       . cholecalciferol (VITAMIN D) 1000 UNITS tablet Take 1,000 Units by mouth daily.      Marland Kitchen diltiazem (CARDIZEM CD) 240 MG 24 hr capsule Take 240 mg by mouth daily.      . diphenhydramine-acetaminophen (TYLENOL PM) 25-500 MG TABS Take 1 tablet by mouth at bedtime as needed. For sleep      . doxazosin (CARDURA) 2 MG tablet Take 2 mg by mouth at bedtime.      . ferrous sulfate 325 (65 FE) MG tablet Take 325 mg by mouth daily with breakfast.      . Fish Oil-Cholecalciferol (OMEGA-3 FISH OIL/VITAMIN D3) 1000-1000 MG-UNIT CAPS Take by mouth daily.      . fluticasone (FLONASE) 50 MCG/ACT nasal spray Place 2 sprays into the nose daily.      . furosemide (LASIX) 20 MG tablet Take 20 mg by mouth 2 (two) times daily.      Marland Kitchen glimepiride (AMARYL) 2 MG tablet Take 2 mg by mouth daily before breakfast.      . HYDROcodone-acetaminophen (VICODIN) 5-500 MG per tablet Take 1 tablet by mouth every 6 (six) hours as needed. For pain      . Multiple Vitamin (MULTIVITAMIN) capsule Take 1 capsule by mouth daily.      . niacin (SLO-NIACIN) 500 MG tablet Take 500 mg by mouth 2 (two) times daily with a meal.      . omeprazole (PRILOSEC) 20 MG capsule Take 20 mg by mouth daily.      . temazepam (RESTORIL) 30 MG capsule Take 30 mg by mouth at bedtime as needed. For sleep      . desloratadine (CLARINEX) 5 MG tablet Take 5 mg by mouth daily.      . fish oil-omega-3 fatty acids 1000 MG capsule Take 2 g by mouth daily.      . hydroxypropyl methylcellulose (ISOPTO TEARS) 2.5 % ophthalmic solution Place 1 drop into both eyes daily as needed.      . loratadine (CLARITIN) 10 MG tablet Take 10 mg by mouth daily.      . polyethylene glycol (MIRALAX / GLYCOLAX) packet Take 17 g by mouth daily.        Physical Examination  Filed Vitals:   10/23/11 1439  BP: 139/60  Pulse: 67  Resp:     Body mass index is Christopher.61 kg/(m^2).  General:  WDWN in NAD Gait: Normal HEENT: WNL Eyes: Pupils equal Pulmonary: normal non-labored  breathing , without Rales, rhonchi,  wheezing Cardiac: RRR, without  Murmurs, rubs or gallops; Abdomen: soft, NT, no masses Skin: no rashes, ulcers noted  Vascular Exam Pulses: 3+ radial pulses bilaterally Carotid bruits are heard bilaterally to auscultation Extremities without ischemic changes, no Gangrene , no cellulitis; no open wounds;  Musculoskeletal: no muscle wasting or atrophy   Neurologic: A&O X 3; Appropriate Affect ; SENSATION: normal; MOTOR FUNCTION:  moving all  extremities equally. Speech is fluent/normal  Non-Invasive Vascular Imaging CAROTID DUPLEX 10/23/2011  Right ICA 60 - 79 % stenosis Left ICA 0 - 19% stenosis   ASSESSMENT/PLAN: [name] seen one month earlier than scheduled for routine carotid surveillance do to the above CT scan findings. The carotids have not changed from previous exam in March of 2013. We will continue to monitor the [name] every 6 months, the [name]'s in agreement with this, his questions were encouraged and answered. The [name] knows the signs and symptoms of CVA and knows to report to the nearest emergency department should this occur.  Lauree Chandler ANP Clinic MD: Fields    Chronic right brain infarct occipital distribution.  Asymptomatic currently.  He has a moderated right side carotid stenosis but this is probably not source of old CVA.  We will continue 6 month surveillance and consider CEA if he develops TIA or stroke symptoms or progression of stenosis.  Fabienne Bruns, MD [facility] Office: [phone] Pager: [phone]

## 2011-10-30 NOTE — Procedures (Unsigned)
CAROTID DUPLEX EXAM  INDICATION:  Carotid disease.  HISTORY: Diabetes:  Yes Cardiac:  No Hypertension:  Yes Smoking:  Previous Previous Surgery:  Innominate and aortic arch replacement on 09/12/2010 CV History:  Evidence of posterior CVA noted on CT performed 09/25/2011 Amaurosis Fugax No, Paresthesias No, Hemiparesis No                                      RIGHT             LEFT Brachial systolic pressure:         124               146 Brachial Doppler waveforms:         Normal            Normal Vertebral direction of flow:        Antegrade         Antegrade DUPLEX VELOCITIES (cm/sec) CCA peak systolic                   83                Occluded ECA peak systolic                   141               52 (retrograde) ICA peak systolic                   225               69 ICA end diastolic                   44                21 PLAQUE MORPHOLOGY:                  Mixed             Mixed PLAQUE AMOUNT:                      Moderate          Occlusive PLAQUE LOCATION:                    ICA/ECA/CCA       ICA/ECA/CCA  IMPRESSION: 1. Doppler velocity suggests a high end 40%-59% stenosis of the right     internal carotid artery. 2. 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 3. No significant change in the velocities of the bilateral carotid     arteries when compared to the previous exam on 06/21/2011. 4. The right brachial pressure measured lower than the left brachial     pressure.  ___________________________________________ Janetta Hora. Fields, MD  CH/MEDQ  D:  10/27/2011  T:  10/27/2011  Job:  [id number]

## 2011-12-16 ENCOUNTER — Ambulatory Visit: Payer: Medicare Other | Admitting: Neurosurgery

## 2011-12-16 ENCOUNTER — Other Ambulatory Visit: Payer: Medicare Other

## 2012-04-09 ENCOUNTER — Other Ambulatory Visit: Payer: Self-pay | Admitting: *Deleted

## 2012-04-09 DIAGNOSIS — I6529 Occlusion and stenosis of unspecified carotid artery: Secondary | ICD-10-CM

## 2012-04-09 DIAGNOSIS — Z48812 Encounter for surgical aftercare following surgery on the circulatory system: Secondary | ICD-10-CM

## 2012-04-21 ENCOUNTER — Encounter: Payer: Self-pay | Admitting: Neurosurgery

## 2012-04-22 ENCOUNTER — Other Ambulatory Visit: Payer: Medicare Other

## 2012-04-22 ENCOUNTER — Ambulatory Visit: Payer: Medicare Other | Admitting: Neurosurgery

## 2012-04-29 ENCOUNTER — Other Ambulatory Visit: Payer: Medicare Other

## 2012-04-29 ENCOUNTER — Ambulatory Visit: Payer: Medicare Other | Admitting: Neurosurgery

## 2012-05-13 ENCOUNTER — Other Ambulatory Visit: Payer: Medicare Other

## 2012-05-13 ENCOUNTER — Ambulatory Visit: Payer: Medicare Other | Admitting: Neurosurgery

## 2012-06-02 ENCOUNTER — Encounter: Payer: Self-pay | Admitting: Neurosurgery

## 2012-06-02 ENCOUNTER — Other Ambulatory Visit: Payer: Self-pay

## 2012-06-03 ENCOUNTER — Encounter (INDEPENDENT_AMBULATORY_CARE_PROVIDER_SITE_OTHER): Payer: Medicare Other | Admitting: *Deleted

## 2012-06-03 ENCOUNTER — Encounter: Payer: Self-pay | Admitting: Neurosurgery

## 2012-06-03 ENCOUNTER — Other Ambulatory Visit (INDEPENDENT_AMBULATORY_CARE_PROVIDER_SITE_OTHER): Payer: Medicare Other | Admitting: *Deleted

## 2012-06-03 ENCOUNTER — Ambulatory Visit (INDEPENDENT_AMBULATORY_CARE_PROVIDER_SITE_OTHER): Payer: Medicare Other | Admitting: Neurosurgery

## 2012-06-03 ENCOUNTER — Other Ambulatory Visit: Payer: Self-pay | Admitting: *Deleted

## 2012-06-03 DIAGNOSIS — Z48812 Encounter for surgical aftercare following surgery on the circulatory system: Secondary | ICD-10-CM

## 2012-06-03 DIAGNOSIS — I6529 Occlusion and stenosis of unspecified carotid artery: Secondary | ICD-10-CM

## 2012-06-03 DIAGNOSIS — I739 Peripheral vascular disease, unspecified: Secondary | ICD-10-CM

## 2012-06-03 NOTE — Progress Notes (Signed)
[facility] [place] Carotid Office Note  CC: Carotid surveillance with PAD surveillance Referring Physician: Fields  History of Present Illness: 79 year old male patient of Dr. Darrick Penna status post left femoropopliteal bypass in 1999 with right external iliac artery PTA in 1997. Patient does have evidence of a posterior CVA noted on CT scan in June of 2013. The patient has a history of an innominate and aortic arch replacement in June of 2012. The patient denies true claudication or rest pain, the patient denies signs or symptoms of CVA, TIA, amaurosis fugax. The patient does ambulate with a rolling walker.  Past Medical History  Diagnosis Date  . Diabetes mellitus   . Hypertension   . GERD (gastroesophageal reflux disease)   . Osteoporosis, senile   . High cholesterol   . Seasonal allergies   . MRSA infection   . Anemia   . Staph infection 2001  . Stroke     ROS: [x]  Positive   [ ]  Denies    General: [ ]  Weight loss, [ ]  Fever, [ ]  chills Neurologic: [ ]  Dizziness, [ ]  Blackouts, [ ]  Seizure [ ]  Stroke, [ ]  "Mini stroke", [ ]  Slurred speech, [ ]  Temporary blindness; [ ]  weakness in arms or legs, [ ]  Hoarseness Cardiac: [ ]  Chest pain/pressure, [ ]  Shortness of breath at rest [ ]  Shortness of breath with exertion, [ ]  Atrial fibrillation or irregular heartbeat Vascular: [ ]  Pain in legs with walking, [ ]  Pain in legs at rest, [ ]  Pain in legs at night,  [ ]  Non-healing ulcer, [ ]  Blood clot in vein/DVT,   Pulmonary: [ ]  Home oxygen, [ ]  Productive cough, [ ]  Coughing up blood, [ ]  Asthma,  [ ]  Wheezing Musculoskeletal:  [ ]  Arthritis, [ ]  Low back pain, [ ]  Joint pain Hematologic: [ ]  Easy Bruising, [ ]  Anemia; [ ]  Hepatitis Gastrointestinal: [ ]  Blood in stool, [ ]  Gastroesophageal Reflux/heartburn, [ ]  Trouble swallowing Urinary: [ ]  chronic Kidney disease, [ ]  on HD - [ ]  MWF or [ ]  TTHS, [ ]  Burning with urination, [ ]  Difficulty urinating Skin: [ ]  Rashes,  [ ]  Wounds Psychological: [ ]  Anxiety, [ ]  Depression   Social History History  Substance Use Topics  . Smoking status: Former Smoker    Quit date: 09/24/2000  . Smokeless tobacco: Not on file  . Alcohol Use: No    Family History Family History  Problem Relation Age of Onset  . Diabetes Mother   . Hypertension Mother   . Diabetes Sister   . Heart disease Son     Heart Disease before age 29    Allergies  Allergen Reactions  . Codeine Nausea And Vomiting    Current Outpatient Prescriptions  Medication Sig Dispense Refill  . aspirin EC 81 MG tablet Take 81 mg by mouth daily.      Marland Kitchen atorvastatin (LIPITOR) 40 MG tablet Take 40 mg by mouth daily.      . benazepril (LOTENSIN) 10 MG tablet Take 10 mg by mouth daily.      . carvedilol (COREG) 6.25 MG tablet Take 6.25 mg by mouth 2 (two) times daily with a meal.      . cholecalciferol (VITAMIN D) 1000 UNITS tablet Take 1,000 Units by mouth daily.      Marland Kitchen desloratadine (CLARINEX) 5 MG tablet Take 5 mg by mouth daily.      Marland Kitchen diltiazem (CARDIZEM CD) 240 MG 24 hr  capsule Take 240 mg by mouth daily.      . diphenhydramine-acetaminophen (TYLENOL PM) 25-500 MG TABS Take 1 tablet by mouth at bedtime as needed. For sleep      . doxazosin (CARDURA) 2 MG tablet Take 2 mg by mouth at bedtime.      . ferrous sulfate 325 (65 FE) MG tablet Take 325 mg by mouth daily with breakfast.      . Fish Oil-Cholecalciferol (OMEGA-3 FISH OIL/VITAMIN D3) 1000-1000 MG-UNIT CAPS Take by mouth daily.      . fish oil-omega-3 fatty acids 1000 MG capsule Take 2 g by mouth daily.      . fluticasone (FLONASE) 50 MCG/ACT nasal spray Place 2 sprays into the nose daily.      . furosemide (LASIX) 20 MG tablet Take 20 mg by mouth 2 (two) times daily.      Marland Kitchen glimepiride (AMARYL) 2 MG tablet Take 2 mg by mouth daily before breakfast.      . HYDROcodone-acetaminophen (NORCO/VICODIN) 5-325 MG per tablet Take 1 tablet by mouth.      . hydroxypropyl methylcellulose (ISOPTO  TEARS) 2.5 % ophthalmic solution Place 1 drop into both eyes daily as needed.      . loratadine (CLARITIN) 10 MG tablet Take 10 mg by mouth daily.      . Multiple Vitamin (MULTIVITAMIN) capsule Take 1 capsule by mouth daily.      . niacin (SLO-NIACIN) 500 MG tablet Take 500 mg by mouth 2 (two) times daily with a meal.      . omeprazole (PRILOSEC) 20 MG capsule Take 20 mg by mouth daily.      . polyethylene glycol (MIRALAX / GLYCOLAX) packet Take 17 g by mouth daily.      . temazepam (RESTORIL) 30 MG capsule Take 30 mg by mouth at bedtime as needed. For sleep       No current facility-administered medications for this visit.    Physical Examination  Filed Vitals:   06/03/12 1256  BP: 160/64  Pulse: 68  Resp:     Body mass index is 27.11 kg/(m^2).  General:  WDWN in NAD Gait: Normal HEENT: WNL Eyes: Pupils equal Pulmonary: normal non-labored breathing , without Rales, rhonchi,  wheezing Cardiac: RRR, without  Murmurs, rubs or gallops; Abdomen: soft, NT, no masses Skin: no rashes, ulcers noted  Vascular Exam Pulses: Lower extremity pulses are not palpable, 2+ radial pulses bilaterally Carotid bruits: Carotid pulses to auscultation on the right with a known left occlusion of the left common carotid artery Extremities without ischemic changes, no Gangrene , no cellulitis; no open wounds;  Musculoskeletal: no muscle wasting or atrophy   Neurologic: A&O X 3; Appropriate Affect ; SENSATION: normal; MOTOR FUNCTION:  moving all extremities equally. Speech is fluent/normal  Non-Invasive Vascular Imaging CAROTID DUPLEX 06/03/2012  Right ICA 40 - 59 % stenosis Left ICA 20 - 39 % stenosis ABIs today are 0.68 on the right and monophasic, 0.91 and biphasic on the left, previous ABIs from 2012 are 0.74 on the right, 1.05 on the left  ASSESSMENT/PLAN: Asymptomatic patient that will followup in 6 months with repeat carotid duplex and ABIs and per his request see Dr. Darrick Penna. The patient  declines any further diagnostics or intervention regarding his lower extremities at this time. The patient's questions were encouraged and answered, he is in agreement with this plan.  Lauree Chandler ANP   Clinic MD: Darrick Penna

## 2012-08-04 ENCOUNTER — Other Ambulatory Visit: Payer: Self-pay | Admitting: Gastroenterology

## 2012-08-05 ENCOUNTER — Encounter (HOSPITAL_COMMUNITY): Payer: Self-pay | Admitting: *Deleted

## 2012-08-06 ENCOUNTER — Encounter (HOSPITAL_COMMUNITY): Payer: Self-pay | Admitting: Pharmacy Technician

## 2012-08-10 ENCOUNTER — Ambulatory Visit (HOSPITAL_COMMUNITY): Payer: Medicare Other | Admitting: Anesthesiology

## 2012-08-10 ENCOUNTER — Encounter (HOSPITAL_COMMUNITY): Payer: Self-pay | Admitting: Anesthesiology

## 2012-08-10 ENCOUNTER — Ambulatory Visit (HOSPITAL_COMMUNITY)
Admission: RE | Admit: 2012-08-10 | Discharge: 2012-08-10 | Disposition: A | Discharge status: Home / Self Care | Payer: Medicare Other | Source: Ambulatory Visit | Attending: Gastroenterology | Admitting: Gastroenterology

## 2012-08-10 ENCOUNTER — Encounter (HOSPITAL_COMMUNITY)
Admission: RE | Disposition: A | Discharge status: Home / Self Care | Payer: Self-pay | Source: Ambulatory Visit | Attending: Gastroenterology

## 2012-08-10 ENCOUNTER — Encounter (HOSPITAL_COMMUNITY): Payer: Self-pay | Admitting: *Deleted

## 2012-08-10 DIAGNOSIS — E7800 Pure hypercholesterolemia, unspecified: Secondary | ICD-10-CM | POA: Insufficient documentation

## 2012-08-10 DIAGNOSIS — D539 Nutritional anemia, unspecified: Secondary | ICD-10-CM | POA: Diagnosis present

## 2012-08-10 DIAGNOSIS — Z79899 Other long term (current) drug therapy: Secondary | ICD-10-CM | POA: Diagnosis not present

## 2012-08-10 DIAGNOSIS — E119 Type 2 diabetes mellitus without complications: Secondary | ICD-10-CM | POA: Insufficient documentation

## 2012-08-10 DIAGNOSIS — I10 Essential (primary) hypertension: Secondary | ICD-10-CM | POA: Insufficient documentation

## 2012-08-10 DIAGNOSIS — K648 Other hemorrhoids: Secondary | ICD-10-CM | POA: Insufficient documentation

## 2012-08-10 DIAGNOSIS — K5900 Constipation, unspecified: Secondary | ICD-10-CM | POA: Diagnosis not present

## 2012-08-10 DIAGNOSIS — D126 Benign neoplasm of colon, unspecified: Secondary | ICD-10-CM | POA: Insufficient documentation

## 2012-08-10 DIAGNOSIS — Z860100 Personal history of colon polyps, unspecified: Secondary | ICD-10-CM | POA: Insufficient documentation

## 2012-08-10 DIAGNOSIS — R195 Other fecal abnormalities: Secondary | ICD-10-CM | POA: Insufficient documentation

## 2012-08-10 HISTORY — PX: COLONOSCOPY WITH PROPOFOL: SHX5780

## 2012-08-10 HISTORY — DX: Personal history of other medical treatment: Z92.89

## 2012-08-10 HISTORY — PX: ESOPHAGOGASTRODUODENOSCOPY (EGD) WITH PROPOFOL: SHX5813

## 2012-08-10 LAB — GLUCOSE, CAPILLARY: Glucose-Capillary: 108 mg/dL — ABNORMAL HIGH (ref 70–99)

## 2012-08-10 SURGERY — COLONOSCOPY WITH PROPOFOL
Anesthesia: Monitor Anesthesia Care

## 2012-08-10 MED ORDER — PROPOFOL 10 MG/ML IV EMUL
INTRAVENOUS | Status: DC | PRN
  Administered 2012-08-10: 300 ug/kg/min via INTRAVENOUS

## 2012-08-10 MED ORDER — SODIUM CHLORIDE 0.9 % IV SOLN: INTRAVENOUS | Status: DC

## 2012-08-10 MED ORDER — PROPOFOL 10 MG/ML IV BOLUS
INTRAVENOUS | Status: DC | PRN
  Administered 2012-08-10: 50 mg via INTRAVENOUS

## 2012-08-10 MED ORDER — BUTAMBEN-TETRACAINE-BENZOCAINE 2-2-14 % EX AERO
INHALATION_SPRAY | CUTANEOUS | Status: DC | PRN
  Administered 2012-08-10: 2 via TOPICAL

## 2012-08-10 MED ORDER — SODIUM CHLORIDE 0.9 % IJ SOLN
INTRAMUSCULAR | Status: DC | PRN
  Administered 2012-08-10: 11 mL via INTRAVENOUS

## 2012-08-10 MED ORDER — LACTATED RINGERS IV SOLN
INTRAVENOUS | Status: DC
  Administered 2012-08-10: 09:00:00 via INTRAVENOUS

## 2012-08-10 SURGICAL SUPPLY — 25 items

## 2012-08-10 NOTE — Anesthesia Postprocedure Evaluation (Signed)
  Anesthesia Post-op Note  Patient: [name]  Procedure(s) Performed: Procedure(s) (LRB): COLONOSCOPY WITH PROPOFOL (N/A) ESOPHAGOGASTRODUODENOSCOPY (EGD) WITH PROPOFOL (N/A)  Patient Location: PACU  Anesthesia Type: MAC  Level of Consciousness: awake and alert   Airway and Oxygen Therapy: Patient Spontanous Breathing  Post-op Pain: mild  Post-op Assessment: Post-op Vital signs reviewed, Patient's Cardiovascular Status Stable, Respiratory Function Stable, Patent Airway and No signs of Nausea or vomiting  Last Vitals:  Filed Vitals:   08/10/12 1142  BP: 165/90  Temp:   Resp: 23    Post-op Vital Signs: stable   Complications: No apparent anesthesia complications

## 2012-08-10 NOTE — Transfer of Care (Signed)
Immediate Anesthesia Transfer of Care Note  Patient: [name]  Procedure(s) Performed: Procedure(s): COLONOSCOPY WITH PROPOFOL (N/A) ESOPHAGOGASTRODUODENOSCOPY (EGD) WITH PROPOFOL (N/A)  Patient Location: PACU  Anesthesia Type:MAC  Level of Consciousness: awake, alert , oriented, patient cooperative and responds to stimulation  Airway & Oxygen Therapy: Patient Spontanous Breathing and Patient connected to nasal cannula oxygen  Post-op Assessment: Report given to PACU RN, Post -op Vital signs reviewed and stable and Patient moving all extremities X 4  Post vital signs: Reviewed and stable  Complications: No apparent anesthesia complications

## 2012-08-10 NOTE — Anesthesia Preprocedure Evaluation (Addendum)
Anesthesia Evaluation  Patient identified by MRN, date of birth, ID band Patient awake    Reviewed: Allergy & Precautions, H&P , NPO status , Patient's Chart, lab work & pertinent test results  Airway Mallampati: II TM Distance: >3 FB Neck ROM: Full    Dental no notable dental hx. (+) Missing   Pulmonary neg pulmonary ROS,  breath sounds clear to auscultation  Pulmonary exam normal       Cardiovascular hypertension, Pt. on medications + Peripheral Vascular Disease Rhythm:Regular Rate:Normal     Neuro/Psych CVA negative psych ROS   GI/Hepatic Neg liver ROS,   Endo/Other  diabetes, Type 2, Oral Hypoglycemic Agents  Renal/GU negative Renal ROS  negative genitourinary   Musculoskeletal negative musculoskeletal ROS (+)   Abdominal   Peds negative pediatric ROS (+)  Hematology negative hematology ROS (+)   Anesthesia Other Findings   Reproductive/Obstetrics negative OB ROS                           Anesthesia Physical Anesthesia Plan  ASA: III  Anesthesia Plan: MAC   Post-op Pain Management:    Induction: Intravenous  Airway Management Planned: Simple Face Mask  Additional Equipment:   Intra-op Plan:   Post-operative Plan: Extubation in OR  Informed Consent: I have reviewed the patients History and Physical, chart, labs and discussed the procedure including the risks, benefits and alternatives for the proposed anesthesia with the patient or authorized representative who has indicated his/her understanding and acceptance.   Dental advisory given  Plan Discussed with: CRNA  Anesthesia Plan Comments:        Anesthesia Quick Evaluation

## 2012-08-10 NOTE — H&P (Signed)
  Problem: Heme positive stool on aspirin, macrocytic anemia, history of duodenal ulcer  History: The patient is a 79 year old male born 02/24/1931. He underwent a normal surveillance colonoscopy in May 2011. He was diagnosed with a duodenal ulcer in late 2011. In January 2012, his esophagogastroduodenoscopy showed a healed duodenal ulcer.  The patient chronically takes aspirin 81 mg daily and omeprazole 20 mg daily.  The patient underwent his health maintenance physical examination and submitted stool for Hemoccult testing. His stool was Hemoccult-positive. His hemoglobin was 11.5 g with macrocytic indices.  The patient denies gastrointestinal bleeding.  The patient is scheduled to undergo a diagnostic esophagogastroduodenoscopy and colonoscopy to evaluate heme-positive stool using propofol sedation.  Chronic medications: Carvedilol. Flonase. Lipitor. Temazepam. Benazepril. Hydrocodone. Acetaminophen. Cartia. Doxazosin. Furosemide. Iron. Niacin. Fish oil. Glipizide. Loratadine. Omeprazole.  Past medical history: History of adenomatous colon polyps. Type 2 diabetes mellitus. Hypercholesterolemia. Allergic rhinitis. Insomnia. Iron deficiency anemia. Vitamin D. deficiency. Chronic constipation. Hypertension. Chronic back and the pain. Left knee surgery. Back surgery. Aneurysm repair. Right hip replacement surgery.   Medication allergies: None  Habits: The patient quit smoking cigarettes in 2002. He does not consume alcohol.  Exam: The patient is alert and lying comfortably on the endoscopy stretcher. Sclera are nonicteric. Mouth and throat appear normal. Lungs are clear to auscultation. Cardiac exam reveals a regular rhythm. Abdomen is soft, flat, and nontender to palpation in all quadrants.  Plan: Proceed with diagnostic esophagogastroduodenoscopy and colonoscopy to evaluate heme positive stool using propofol sedation.

## 2012-08-10 NOTE — Preoperative (Signed)
Beta Blockers   Reason not to administer Beta Blockers:Not Applicable, took BB this am 

## 2012-08-10 NOTE — Op Note (Signed)
Problem: Hemoccult-positive stool on aspirin, macrocytic anemia, history of duodenal ulcer.   Procedure: Diagnostic esophagogastroduodenoscopy  Endoscopist: Danise Edge  Premedication: Propofol administered by anesthesia  Procedure: The patient was placed in the left lateral decubitus position. The Pentax gastroscope was passed through the posterior hypopharynx into the proximal esophagus without difficulty. The hypopharynx, larynx, and vocal cords appeared normal.  Esophagoscopy: The proximal, mid, and lower segments of the esophageal mucosa appear normal. The squamocolumnar junction is noted at 40 cm from the incisor teeth and unassociated with Barrett's esophagus or erosive esophagitis.  Gastroscopy: Retroflex view of the gastric cardia and fundus was normal. The gastric body, antrum, and pylorus appeared normal.  Duodenoscopy: The duodenal bulb and descending duodenum appeared normal.  Assessment: Normal esophagogastroduodenoscopy.  Procedure: Diagnostic colonoscopy  Procedure: Anal inspection and digital rectal exam were normal. The Pentax pediatric colonoscope was introduced into the rectum and with a great deal of difficulty due to to colonic loop formation advanced to the cecum as identified by a normal-appearing ileocecal valve and appendiceal orifice. Colonic preparation for the exam today was good.  Rectum. Normal. Retroflex view of the distal rectum reveals large internal hemorrhoids.  Sigmoid colon and descending colon. Normal.  Splenic flexure. Normal.  Transverse colon. Normal.  Hepatic flexure. Normal.  Ascending colon. Normal.  Cecum and ileocecal valve. From the proximal cecum a 6 mm sessile serrated appearing polyp was lifted by saline injection and removed with the electrocautery snare.  Assessment:  #1. From the proximal cecum a 6 mm sessile serrated appearing polyp was removed with the electrocautery snare.  #2. Otherwise normal diagnostic  proctocolonoscopy to the cecum.

## 2012-08-11 ENCOUNTER — Encounter (HOSPITAL_COMMUNITY): Payer: Self-pay | Admitting: Gastroenterology

## 2012-11-11 ENCOUNTER — Other Ambulatory Visit (HOSPITAL_COMMUNITY): Payer: Self-pay | Admitting: Internal Medicine

## 2012-11-11 ENCOUNTER — Ambulatory Visit (HOSPITAL_COMMUNITY)
Admission: RE | Admit: 2012-11-11 | Discharge: 2012-11-11 | Disposition: A | Discharge status: Home / Self Care | Payer: Medicare Other | Source: Ambulatory Visit | Attending: Internal Medicine | Admitting: Internal Medicine

## 2012-11-11 ENCOUNTER — Encounter (HOSPITAL_COMMUNITY): Payer: Self-pay

## 2012-11-11 DIAGNOSIS — M810 Age-related osteoporosis without current pathological fracture: Secondary | ICD-10-CM | POA: Insufficient documentation

## 2012-11-11 MED ORDER — ZOLEDRONIC ACID 5 MG/100ML IV SOLN
5.0000 mg | Freq: Once | INTRAVENOUS | Status: AC
  Administered 2012-11-11: 5 mg via INTRAVENOUS
  Filled 2012-11-11: qty 100

## 2012-11-11 MED ORDER — SODIUM CHLORIDE 0.9 % IV SOLN
Freq: Once | INTRAVENOUS | Status: AC
  Administered 2012-11-11: 14:00:00 via INTRAVENOUS

## 2012-12-03 ENCOUNTER — Other Ambulatory Visit: Payer: Self-pay | Admitting: Physician Assistant

## 2012-12-08 ENCOUNTER — Encounter: Payer: Self-pay | Admitting: Vascular Surgery

## 2012-12-09 ENCOUNTER — Ambulatory Visit (INDEPENDENT_AMBULATORY_CARE_PROVIDER_SITE_OTHER): Payer: Medicare Other | Admitting: Vascular Surgery

## 2012-12-09 ENCOUNTER — Other Ambulatory Visit (INDEPENDENT_AMBULATORY_CARE_PROVIDER_SITE_OTHER): Payer: Medicare Other | Admitting: Vascular Surgery

## 2012-12-09 ENCOUNTER — Encounter: Payer: Self-pay | Admitting: Vascular Surgery

## 2012-12-09 ENCOUNTER — Encounter (INDEPENDENT_AMBULATORY_CARE_PROVIDER_SITE_OTHER): Payer: Medicare Other | Admitting: Vascular Surgery

## 2012-12-09 DIAGNOSIS — I739 Peripheral vascular disease, unspecified: Secondary | ICD-10-CM

## 2012-12-09 DIAGNOSIS — I6529 Occlusion and stenosis of unspecified carotid artery: Secondary | ICD-10-CM

## 2012-12-09 DIAGNOSIS — Z48812 Encounter for surgical aftercare following surgery on the circulatory system: Secondary | ICD-10-CM

## 2012-12-09 NOTE — Progress Notes (Signed)
[facility] [place]  Established Carotid Patient  History of Present Illness  [name] is a 79 y.o. male who has known carotid stenosis, lower extremity left fem-pop repair and right external iliac artery PTA.  He has now new symptoms.  He denies rest pain or pain with ambulation in both LE.  No change is his vision or speech.   He is also previously had innominate artery and left common carotid artery bypass by Dr. Madilyn Fireman in 2002. He denies any symptoms of TIA amaurosis or stroke    Past Medical History, Past Surgical History, Social History, Family History, Medications, Allergies, and Review of Systems are unchanged from previous evaluation on 06/03/2012.    Physical Examination  Filed Vitals:   12/09/12 1553 12/09/12 1559  BP: 166/64 167/76  Pulse: 69   Height: 5\' 4"  (1.626 m)   Weight: 162 lb 9.6 oz (73.755 kg)   SpO2: 98%     General: A&O x 3, WDWN  Eyes: PERRLA,  Pulmonary: Sym exp, good air movt, CTAB,  Neck: Bilateral carotid bruits   Cardiac: RRR, soft systolic murmur  Vascular:  Palpable femoral arteries, left DP palp.  Both feet are warm to touch.     Gastrointestinal: soft, NTND, no mass  Musculoskeletal: M/S 5/5 throughout  Neurologic: CN 2-12 intact  Pain and light touch intact in extremitiesMotor exam as listed above  Non-Invasive Vascular Imaging  CAROTID DUPLEX (Date: 12/09/2012):   R ICA stenosis: 40-59%   L ICA stenosis: <40%  ABI Right 0.76 Left 1.03  Medical Decision Making  [name] is a 79 y.o. male who presents for a 6 month follow up s/p left femoropopliteal bypass in 1999 with right external iliac artery PTA in 1997.  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   He will f/u in 6 months for ABI's and carotid duplex with Dr. Darrick Penna.    [facility] [place] Office: [phone] Thomasena Edis, [place] [facility] PA-C   History exam details as above. Previous arch  reconstruction asymptomatic. Bilateral moderate carotid stenosis asymptomatic. Mild right peripheral arterial disease patent left femoropopliteal bypass all asymptomatic  Followup carotid duplex 6 months with repeat ABIs  Fabienne Bruns, MD [facility] [place] Office: [phone] Pager: [phone]

## 2012-12-10 NOTE — Addendum Note (Signed)
Addended by: Adria Dill L on: 12/10/2012 10:55 AM   Modules accepted: Orders

## 2013-04-07 ENCOUNTER — Encounter (INDEPENDENT_AMBULATORY_CARE_PROVIDER_SITE_OTHER): Payer: Self-pay | Admitting: Surgery

## 2013-04-07 ENCOUNTER — Encounter (INDEPENDENT_AMBULATORY_CARE_PROVIDER_SITE_OTHER): Payer: Self-pay

## 2013-04-07 ENCOUNTER — Ambulatory Visit (INDEPENDENT_AMBULATORY_CARE_PROVIDER_SITE_OTHER): Payer: Medicare Other | Admitting: Surgery

## 2013-04-07 VITALS — BP 141/75 | HR 74 | Temp 98.2°F | Resp 18 | Ht 65.0 in | Wt 162.6 lb

## 2013-04-07 DIAGNOSIS — L723 Sebaceous cyst: Secondary | ICD-10-CM

## 2013-04-07 DIAGNOSIS — L089 Local infection of the skin and subcutaneous tissue, unspecified: Secondary | ICD-10-CM | POA: Insufficient documentation

## 2013-04-07 MED ORDER — DOXYCYCLINE HYCLATE 100 MG PO TABS: 100.0000 mg | ORAL_TABLET | Freq: Two times a day (BID) | ORAL | Status: DC

## 2013-04-07 NOTE — Progress Notes (Signed)
Subjective:     Patient ID: [name], male   DOB: 09-May-1930, 80 y.o.   MRN: [id number]  HPI This gentleman is referred by Dr.[name] for evaluation of a small abscess in his right groin. The patient reports that it appeared at the end of December. He had an incision and drainage performed at an urgent care and has been on antibiotics since then. He has not been packing the wound.  Review of Systems     Objective:   Physical Exam On exam, there is a wound in the right groin consistent with an infected sebaceous cyst. I easily remove the sebaceous debris and purulence from the open area. There is no surrounding cellulitis    Assessment:     Infected sebaceous cyst of the right groin     Plan:     The opening is fairly wide so I do not believe it needs any further packing. I will continue him on the doxycycline for 7 more days. I will see him back in approximately 4 weeks. If this area persists, it may need removal in the operating room

## 2013-05-02 ENCOUNTER — Other Ambulatory Visit: Payer: Self-pay | Admitting: Vascular Surgery

## 2013-05-02 DIAGNOSIS — I739 Peripheral vascular disease, unspecified: Secondary | ICD-10-CM

## 2013-05-02 DIAGNOSIS — I6529 Occlusion and stenosis of unspecified carotid artery: Secondary | ICD-10-CM

## 2013-05-02 DIAGNOSIS — Z48812 Encounter for surgical aftercare following surgery on the circulatory system: Secondary | ICD-10-CM

## 2013-05-05 ENCOUNTER — Encounter (INDEPENDENT_AMBULATORY_CARE_PROVIDER_SITE_OTHER): Payer: Medicare Other | Admitting: Surgery

## 2013-05-09 ENCOUNTER — Inpatient Hospital Stay (HOSPITAL_COMMUNITY)
Admission: EM | Admit: 2013-05-09 | Discharge: 2013-05-16 | DRG: 280 | Disposition: A | Discharge status: Home Health | Payer: Medicare Other | Attending: Internal Medicine | Admitting: Internal Medicine

## 2013-05-09 ENCOUNTER — Emergency Department (HOSPITAL_COMMUNITY): Payer: Medicare Other

## 2013-05-09 ENCOUNTER — Encounter (HOSPITAL_COMMUNITY): Payer: Self-pay | Admitting: Emergency Medicine

## 2013-05-09 DIAGNOSIS — Z8249 Family history of ischemic heart disease and other diseases of the circulatory system: Secondary | ICD-10-CM

## 2013-05-09 DIAGNOSIS — R799 Abnormal finding of blood chemistry, unspecified: Secondary | ICD-10-CM

## 2013-05-09 DIAGNOSIS — N179 Acute kidney failure, unspecified: Secondary | ICD-10-CM

## 2013-05-09 DIAGNOSIS — J9601 Acute respiratory failure with hypoxia: Secondary | ICD-10-CM

## 2013-05-09 DIAGNOSIS — Z7982 Long term (current) use of aspirin: Secondary | ICD-10-CM

## 2013-05-09 DIAGNOSIS — J1100 Influenza due to unidentified influenza virus with unspecified type of pneumonia: Secondary | ICD-10-CM | POA: Diagnosis present

## 2013-05-09 DIAGNOSIS — J111 Influenza due to unidentified influenza virus with other respiratory manifestations: Secondary | ICD-10-CM

## 2013-05-09 DIAGNOSIS — J9600 Acute respiratory failure, unspecified whether with hypoxia or hypercapnia: Secondary | ICD-10-CM | POA: Diagnosis present

## 2013-05-09 DIAGNOSIS — Z79899 Other long term (current) drug therapy: Secondary | ICD-10-CM

## 2013-05-09 DIAGNOSIS — K219 Gastro-esophageal reflux disease without esophagitis: Secondary | ICD-10-CM | POA: Diagnosis present

## 2013-05-09 DIAGNOSIS — N189 Chronic kidney disease, unspecified: Secondary | ICD-10-CM | POA: Diagnosis present

## 2013-05-09 DIAGNOSIS — J101 Influenza due to other identified influenza virus with other respiratory manifestations: Secondary | ICD-10-CM

## 2013-05-09 DIAGNOSIS — R509 Fever, unspecified: Secondary | ICD-10-CM

## 2013-05-09 DIAGNOSIS — I5023 Acute on chronic systolic (congestive) heart failure: Secondary | ICD-10-CM

## 2013-05-09 DIAGNOSIS — R778 Other specified abnormalities of plasma proteins: Secondary | ICD-10-CM

## 2013-05-09 DIAGNOSIS — E872 Acidosis, unspecified: Secondary | ICD-10-CM | POA: Diagnosis present

## 2013-05-09 DIAGNOSIS — Z885 Allergy status to narcotic agent status: Secondary | ICD-10-CM

## 2013-05-09 DIAGNOSIS — R000 Tachycardia, unspecified: Secondary | ICD-10-CM | POA: Diagnosis present

## 2013-05-09 DIAGNOSIS — L089 Local infection of the skin and subcutaneous tissue, unspecified: Secondary | ICD-10-CM

## 2013-05-09 DIAGNOSIS — Z66 Do not resuscitate: Secondary | ICD-10-CM | POA: Diagnosis present

## 2013-05-09 DIAGNOSIS — E860 Dehydration: Secondary | ICD-10-CM

## 2013-05-09 DIAGNOSIS — Z833 Family history of diabetes mellitus: Secondary | ICD-10-CM

## 2013-05-09 DIAGNOSIS — E119 Type 2 diabetes mellitus without complications: Secondary | ICD-10-CM

## 2013-05-09 DIAGNOSIS — I509 Heart failure, unspecified: Secondary | ICD-10-CM | POA: Diagnosis present

## 2013-05-09 DIAGNOSIS — Z8673 Personal history of transient ischemic attack (TIA), and cerebral infarction without residual deficits: Secondary | ICD-10-CM | POA: Insufficient documentation

## 2013-05-09 DIAGNOSIS — E1169 Type 2 diabetes mellitus with other specified complication: Secondary | ICD-10-CM | POA: Diagnosis present

## 2013-05-09 DIAGNOSIS — M810 Age-related osteoporosis without current pathological fracture: Secondary | ICD-10-CM | POA: Diagnosis present

## 2013-05-09 DIAGNOSIS — L723 Sebaceous cyst: Secondary | ICD-10-CM

## 2013-05-09 DIAGNOSIS — F411 Generalized anxiety disorder: Secondary | ICD-10-CM | POA: Diagnosis present

## 2013-05-09 DIAGNOSIS — W19XXXA Unspecified fall, initial encounter: Secondary | ICD-10-CM

## 2013-05-09 DIAGNOSIS — R7989 Other specified abnormal findings of blood chemistry: Secondary | ICD-10-CM

## 2013-05-09 DIAGNOSIS — I129 Hypertensive chronic kidney disease with stage 1 through stage 4 chronic kidney disease, or unspecified chronic kidney disease: Secondary | ICD-10-CM | POA: Diagnosis present

## 2013-05-09 DIAGNOSIS — R918 Other nonspecific abnormal finding of lung field: Secondary | ICD-10-CM

## 2013-05-09 DIAGNOSIS — I214 Non-ST elevation (NSTEMI) myocardial infarction: Principal | ICD-10-CM

## 2013-05-09 DIAGNOSIS — D649 Anemia, unspecified: Secondary | ICD-10-CM | POA: Diagnosis present

## 2013-05-09 DIAGNOSIS — E785 Hyperlipidemia, unspecified: Secondary | ICD-10-CM | POA: Diagnosis present

## 2013-05-09 DIAGNOSIS — I739 Peripheral vascular disease, unspecified: Secondary | ICD-10-CM

## 2013-05-09 DIAGNOSIS — I6529 Occlusion and stenosis of unspecified carotid artery: Secondary | ICD-10-CM

## 2013-05-09 DIAGNOSIS — Z96649 Presence of unspecified artificial hip joint: Secondary | ICD-10-CM

## 2013-05-09 DIAGNOSIS — Z515 Encounter for palliative care: Secondary | ICD-10-CM

## 2013-05-09 DIAGNOSIS — Y92009 Unspecified place in unspecified non-institutional (private) residence as the place of occurrence of the external cause: Secondary | ICD-10-CM

## 2013-05-09 DIAGNOSIS — R058 Other specified cough: Secondary | ICD-10-CM

## 2013-05-09 DIAGNOSIS — E7800 Pure hypercholesterolemia, unspecified: Secondary | ICD-10-CM

## 2013-05-09 DIAGNOSIS — Z87891 Personal history of nicotine dependence: Secondary | ICD-10-CM

## 2013-05-09 DIAGNOSIS — I10 Essential (primary) hypertension: Secondary | ICD-10-CM

## 2013-05-09 DIAGNOSIS — R05 Cough: Secondary | ICD-10-CM | POA: Insufficient documentation

## 2013-05-09 DIAGNOSIS — I219 Acute myocardial infarction, unspecified: Secondary | ICD-10-CM

## 2013-05-09 HISTORY — DX: Type 2 diabetes mellitus without complications: E11.9

## 2013-05-09 HISTORY — DX: Influenza due to unidentified influenza virus with other respiratory manifestations: J11.1

## 2013-05-09 HISTORY — DX: Acute myocardial infarction, unspecified: I21.9

## 2013-05-09 HISTORY — DX: Other specified postprocedural states: Z98.890

## 2013-05-09 HISTORY — DX: Personal history of other diseases of the circulatory system: Z86.79

## 2013-05-09 HISTORY — DX: Occlusion and stenosis of left carotid artery: I65.22

## 2013-05-09 HISTORY — DX: Peripheral vascular disease, unspecified: I73.9

## 2013-05-09 LAB — CBC WITH DIFFERENTIAL/PLATELET
BASOS ABS: 0 10*3/uL (ref 0.0–0.1)
BASOS PCT: 0 % (ref 0–1)
Eosinophils Absolute: 0 10*3/uL (ref 0.0–0.7)
Eosinophils Relative: 0 % (ref 0–5)
HCT: 29.2 % — ABNORMAL LOW (ref 39.0–52.0)
Hemoglobin: 10.1 g/dL — ABNORMAL LOW (ref 13.0–17.0)
Lymphocytes Relative: 9 % — ABNORMAL LOW (ref 12–46)
Lymphs Abs: 0.6 10*3/uL — ABNORMAL LOW (ref 0.7–4.0)
MCH: 32.9 pg (ref 26.0–34.0)
MCHC: 34.6 g/dL (ref 30.0–36.0)
MCV: 95.1 fL (ref 78.0–100.0)
MONO ABS: 0.8 10*3/uL (ref 0.1–1.0)
MONOS PCT: 11 % (ref 3–12)
NEUTROS ABS: 5.6 10*3/uL (ref 1.7–7.7)
NEUTROS PCT: 79 % — AB (ref 43–77)
Platelets: 97 10*3/uL — ABNORMAL LOW (ref 150–400)
RBC: 3.07 MIL/uL — ABNORMAL LOW (ref 4.22–5.81)
RDW: 12.4 % (ref 11.5–15.5)
WBC: 7 10*3/uL (ref 4.0–10.5)

## 2013-05-09 LAB — BASIC METABOLIC PANEL
BUN: 78 mg/dL — ABNORMAL HIGH (ref 6–23)
CO2: 15 mEq/L — ABNORMAL LOW (ref 19–32)
Calcium: 8.4 mg/dL (ref 8.4–10.5)
Chloride: 100 mEq/L (ref 96–112)
Creatinine, Ser: 4.32 mg/dL — ABNORMAL HIGH (ref 0.50–1.35)
GFR, EST AFRICAN AMERICAN: 14 mL/min — AB (ref >90)
GFR, EST NON AFRICAN AMERICAN: 12 mL/min — AB (ref >90)
Glucose, Bld: 147 mg/dL — ABNORMAL HIGH (ref 70–99)
POTASSIUM: 4.7 meq/L (ref 3.7–5.3)
SODIUM: 135 meq/L — AB (ref 137–147)

## 2013-05-09 LAB — HEPARIN LEVEL (UNFRACTIONATED)

## 2013-05-09 LAB — GLUCOSE, CAPILLARY
Glucose-Capillary: 140 mg/dL — ABNORMAL HIGH (ref 70–99)
Glucose-Capillary: 168 mg/dL — ABNORMAL HIGH (ref 70–99)
Glucose-Capillary: 224 mg/dL — ABNORMAL HIGH (ref 70–99)
Glucose-Capillary: 238 mg/dL — ABNORMAL HIGH (ref 70–99)

## 2013-05-09 LAB — TROPONIN I
TROPONIN I: 2.53 ng/mL — AB (ref <0.30)
TROPONIN I: 1.55 ng/mL — AB (ref <0.30)
Troponin I: 2.97 ng/mL (ref <0.30)

## 2013-05-09 LAB — URINALYSIS, ROUTINE W REFLEX MICROSCOPIC
Bilirubin Urine: NEGATIVE
Glucose, UA: NEGATIVE mg/dL
HGB URINE DIPSTICK: NEGATIVE
KETONES UR: NEGATIVE mg/dL
Leukocytes, UA: NEGATIVE
Nitrite: NEGATIVE
PH: 5 (ref 5.0–8.0)
Protein, ur: 30 mg/dL — AB
SPECIFIC GRAVITY, URINE: 1.021 (ref 1.005–1.030)
Urobilinogen, UA: 0.2 mg/dL (ref 0.0–1.0)

## 2013-05-09 LAB — PROTIME-INR
INR: 1.08 (ref 0.00–1.49)
Prothrombin Time: 13.8 seconds (ref 11.6–15.2)

## 2013-05-09 LAB — INFLUENZA PANEL BY PCR (TYPE A & B)
H1N1FLUPCR: NOT DETECTED
Influenza A By PCR: POSITIVE — AB
Influenza B By PCR: NEGATIVE

## 2013-05-09 LAB — POCT I-STAT TROPONIN I: Troponin i, poc: 8.27 ng/mL (ref 0.00–0.08)

## 2013-05-09 LAB — URINE MICROSCOPIC-ADD ON

## 2013-05-09 LAB — MRSA PCR SCREENING: MRSA by PCR: NEGATIVE

## 2013-05-09 LAB — APTT: aPTT: 34 seconds (ref 24–37)

## 2013-05-09 MED ORDER — VITAMIN D3 25 MCG (1000 UNIT) PO TABS
2000.0000 [IU] | ORAL_TABLET | Freq: Every day | ORAL | Status: DC
  Administered 2013-05-09 – 2013-05-16 (×8): 2000 [IU] via ORAL
  Filled 2013-05-09 (×8): qty 2

## 2013-05-09 MED ORDER — POLYVINYL ALCOHOL 1.4 % OP SOLN
1.0000 [drp] | Freq: Every day | OPHTHALMIC | Status: DC | PRN
  Filled 2013-05-09: qty 15

## 2013-05-09 MED ORDER — IPRATROPIUM-ALBUTEROL 0.5-2.5 (3) MG/3ML IN SOLN
3.0000 mL | Freq: Once | RESPIRATORY_TRACT | Status: AC
  Administered 2013-05-09: 3 mL via RESPIRATORY_TRACT
  Filled 2013-05-09: qty 3

## 2013-05-09 MED ORDER — LORATADINE 10 MG PO TABS
10.0000 mg | ORAL_TABLET | Freq: Every day | ORAL | Status: DC
  Administered 2013-05-09 – 2013-05-16 (×8): 10 mg via ORAL
  Filled 2013-05-09 (×8): qty 1

## 2013-05-09 MED ORDER — ONDANSETRON HCL 4 MG PO TABS: 4.0000 mg | ORAL_TABLET | Freq: Four times a day (QID) | ORAL | Status: DC | PRN

## 2013-05-09 MED ORDER — HYPROMELLOSE (GONIOSCOPIC) 2.5 % OP SOLN: 1.0000 [drp] | Freq: Every day | OPHTHALMIC | Status: DC | PRN

## 2013-05-09 MED ORDER — ASPIRIN EC 81 MG PO TBEC
81.0000 mg | DELAYED_RELEASE_TABLET | Freq: Every morning | ORAL | Status: DC
  Administered 2013-05-10 – 2013-05-16 (×7): 81 mg via ORAL
  Filled 2013-05-09 (×7): qty 1

## 2013-05-09 MED ORDER — DEXTROSE 5 % IV SOLN
1.0000 g | INTRAVENOUS | Status: DC
  Administered 2013-05-09 – 2013-05-12 (×4): 1 g via INTRAVENOUS
  Filled 2013-05-09 (×4): qty 10

## 2013-05-09 MED ORDER — SODIUM CHLORIDE 0.9 % IV SOLN
INTRAVENOUS | Status: DC
  Administered 2013-05-09 – 2013-05-10 (×2): via INTRAVENOUS

## 2013-05-09 MED ORDER — ASPIRIN 81 MG PO CHEW
324.0000 mg | CHEWABLE_TABLET | Freq: Once | ORAL | Status: AC
  Administered 2013-05-09: 324 mg via ORAL
  Filled 2013-05-09: qty 4

## 2013-05-09 MED ORDER — OSELTAMIVIR PHOSPHATE 75 MG PO CAPS
75.0000 mg | ORAL_CAPSULE | Freq: Two times a day (BID) | ORAL | Status: DC
  Administered 2013-05-09 – 2013-05-10 (×3): 75 mg via ORAL
  Filled 2013-05-09 (×4): qty 1

## 2013-05-09 MED ORDER — HYDROCODONE-ACETAMINOPHEN 5-325 MG PO TABS
2.0000 | ORAL_TABLET | Freq: Four times a day (QID) | ORAL | Status: DC | PRN
  Administered 2013-05-13 – 2013-05-15 (×2): 2 via ORAL
  Filled 2013-05-09: qty 1
  Filled 2013-05-09: qty 2
  Filled 2013-05-09: qty 1

## 2013-05-09 MED ORDER — NIACIN ER 500 MG PO TBCR
500.0000 mg | EXTENDED_RELEASE_TABLET | Freq: Every day | ORAL | Status: DC
  Administered 2013-05-09 – 2013-05-16 (×8): 500 mg via ORAL
  Filled 2013-05-09 (×8): qty 1

## 2013-05-09 MED ORDER — FERROUS SULFATE 325 (65 FE) MG PO TABS
325.0000 mg | ORAL_TABLET | Freq: Every day | ORAL | Status: DC
  Administered 2013-05-10 – 2013-05-16 (×7): 325 mg via ORAL
  Filled 2013-05-09 (×9): qty 1

## 2013-05-09 MED ORDER — FLUTICASONE PROPIONATE 50 MCG/ACT NA SUSP
2.0000 | Freq: Every day | NASAL | Status: DC
  Administered 2013-05-09 – 2013-05-16 (×8): 2 via NASAL
  Filled 2013-05-09 (×2): qty 16

## 2013-05-09 MED ORDER — TEMAZEPAM 15 MG PO CAPS
30.0000 mg | ORAL_CAPSULE | Freq: Every evening | ORAL | Status: DC | PRN
  Administered 2013-05-11 – 2013-05-15 (×4): 30 mg via ORAL
  Filled 2013-05-09 (×4): qty 2

## 2013-05-09 MED ORDER — HEPARIN (PORCINE) IN NACL 100-0.45 UNIT/ML-% IJ SOLN
1500.0000 [IU]/h | INTRAMUSCULAR | Status: DC
  Administered 2013-05-09: 900 [IU]/h via INTRAVENOUS
  Administered 2013-05-11: 1500 [IU]/h via INTRAVENOUS
  Filled 2013-05-09 (×4): qty 250

## 2013-05-09 MED ORDER — MORPHINE SULFATE 2 MG/ML IJ SOLN: 1.0000 mg | INTRAMUSCULAR | Status: DC | PRN

## 2013-05-09 MED ORDER — HEPARIN SODIUM (PORCINE) 5000 UNIT/ML IJ SOLN
5000.0000 [IU] | Freq: Three times a day (TID) | INTRAMUSCULAR | Status: DC
Start: 2013-05-09 — End: 2013-05-09

## 2013-05-09 MED ORDER — DEXTROSE 5 % IV SOLN
500.0000 mg | INTRAVENOUS | Status: DC
  Administered 2013-05-09 – 2013-05-12 (×4): 500 mg via INTRAVENOUS
  Filled 2013-05-09 (×3): qty 500

## 2013-05-09 MED ORDER — SODIUM CHLORIDE 0.9 % IV BOLUS (SEPSIS)
1000.0000 mL | Freq: Once | INTRAVENOUS | Status: AC
  Administered 2013-05-09: 1000 mL via INTRAVENOUS

## 2013-05-09 MED ORDER — CARVEDILOL 6.25 MG PO TABS
6.2500 mg | ORAL_TABLET | Freq: Two times a day (BID) | ORAL | Status: DC
  Administered 2013-05-10 (×2): 6.25 mg via ORAL
  Filled 2013-05-09 (×3): qty 1

## 2013-05-09 MED ORDER — ATORVASTATIN CALCIUM 20 MG PO TABS
20.0000 mg | ORAL_TABLET | Freq: Every morning | ORAL | Status: DC
  Administered 2013-05-10 – 2013-05-16 (×7): 20 mg via ORAL
  Filled 2013-05-09 (×7): qty 1

## 2013-05-09 MED ORDER — INSULIN ASPART 100 UNIT/ML ~~LOC~~ SOLN
0.0000 [IU] | Freq: Three times a day (TID) | SUBCUTANEOUS | Status: DC
  Administered 2013-05-09: 3 [IU] via SUBCUTANEOUS
  Administered 2013-05-09 – 2013-05-10 (×3): 2 [IU] via SUBCUTANEOUS
  Administered 2013-05-10: 5 [IU] via SUBCUTANEOUS
  Administered 2013-05-11: 3 [IU] via SUBCUTANEOUS
  Administered 2013-05-11: 5 [IU] via SUBCUTANEOUS
  Administered 2013-05-11: 7 [IU] via SUBCUTANEOUS
  Administered 2013-05-12: 2 [IU] via SUBCUTANEOUS
  Administered 2013-05-12: 5 [IU] via SUBCUTANEOUS
  Administered 2013-05-12: 2 [IU] via SUBCUTANEOUS
  Administered 2013-05-13: 5 [IU] via SUBCUTANEOUS
  Administered 2013-05-13: 7 [IU] via SUBCUTANEOUS
  Administered 2013-05-13 – 2013-05-14 (×2): 2 [IU] via SUBCUTANEOUS
  Administered 2013-05-14: 3 [IU] via SUBCUTANEOUS
  Administered 2013-05-14: 7 [IU] via SUBCUTANEOUS
  Administered 2013-05-15: 3 [IU] via SUBCUTANEOUS
  Administered 2013-05-15 – 2013-05-16 (×3): 2 [IU] via SUBCUTANEOUS
  Filled 2013-05-09: qty 1

## 2013-05-09 MED ORDER — PANTOPRAZOLE SODIUM 40 MG PO TBEC
40.0000 mg | DELAYED_RELEASE_TABLET | Freq: Every day | ORAL | Status: DC
  Administered 2013-05-09 – 2013-05-12 (×4): 40 mg via ORAL
  Filled 2013-05-09 (×4): qty 1

## 2013-05-09 MED ORDER — DOCUSATE SODIUM 100 MG PO CAPS
100.0000 mg | ORAL_CAPSULE | Freq: Two times a day (BID) | ORAL | Status: DC
  Administered 2013-05-09 – 2013-05-16 (×12): 100 mg via ORAL
  Filled 2013-05-09 (×14): qty 1

## 2013-05-09 MED ORDER — ONDANSETRON HCL 4 MG/2ML IJ SOLN: 4.0000 mg | Freq: Four times a day (QID) | INTRAMUSCULAR | Status: DC | PRN

## 2013-05-09 MED ORDER — HEPARIN BOLUS VIA INFUSION
3000.0000 [IU] | Freq: Once | INTRAVENOUS | Status: AC
Start: 2013-05-09 — End: 2013-05-09
  Administered 2013-05-09: 3000 [IU] via INTRAVENOUS
  Filled 2013-05-09: qty 3000

## 2013-05-09 NOTE — Progress Notes (Signed)
ANTICOAGULATION CONSULT NOTE - Initial Consult  Pharmacy Consult for heparin Indication: chest pain/ACS  Allergies  Allergen Reactions  . Codeine Nausea And Vomiting    Patient Measurements: Height: 5\' 9"  (175.3 cm) Weight: 165 lb (74.844 kg) IBW/kg (Calculated) : 70.7 Heparin Dosing Weight: 75kg  Vital Signs: Temp: 99 F (37.2 C) (02/09 0857) Temp src: Oral (02/09 0857) BP: 120/47 mmHg (02/09 0857) Pulse Rate: 94 (02/09 0857)  Labs:  Recent Labs  05/09/13 0705 05/09/13 0739  HGB  --  10.1*  HCT  --  29.2*  PLT  --  97*  CREATININE 4.32*  --     Estimated Creatinine Clearance: 13.6 ml/min (by C-G formula based on Cr of 4.32).   Medical History: Past Medical History  Diagnosis Date  . Diabetes mellitus, type 2   . Hypertension   . GERD (gastroesophageal reflux disease)   . Osteoporosis, senile   . High cholesterol   . Seasonal allergies   . MRSA infection     s/p '02-no problems now  . Anemia   . Stroke   . Transfusion history     '02- back surgery  . PAD (peripheral artery disease)     s/p L Fem-Pop Bypass; R Iliac PTA  . Left carotid artery occlusion     moderate R carotid disease; L Common Carotid Bypassed during Arch Repair.  . H/O thoracic aortic aneurysm repair     Stent Graft (penetrating ulcer)   Assessment: 41 YOM who is s/p fall this morning- he denied lightheadedness or dizziness as well as palpitations to cardiologist. Troponin elevated at 8.27, EKG without significant changes from previous. Not on anticoagulation PTA. Baseline Hgb this morning 10.1, plts 97 (no prior lab values, unsure if this is chronic). No overt bleeding noted  Goal of Therapy:  Heparin level 0.3-0.7 units/ml Monitor platelets by anticoagulation protocol: Yes   Plan:  1. Stat aPTT and PT/INR to establish baseline 2. Heparin bolus with 3000 units IV x1 3. Start heparin gtt at 900 units/hr 4. Heparin level in 8 hours 5. Daily HL and CBC 6. Follow for LOT of heparin  therapy- per cards note appears it will be 48-72hours  [name] D. [name], PharmD, BCPS Clinical Pharmacist Pager: [phone] 05/09/2013 10:42 AM

## 2013-05-09 NOTE — Progress Notes (Signed)
Pt is resting in bed. Pt's son just left floor to go home. Pt's son will be back in morning. Pt has no requests at this time.

## 2013-05-09 NOTE — ED Notes (Signed)
Troponin results given to Dr. Karle Starch and shown to primary nurse Hassan Rowan

## 2013-05-09 NOTE — Consult Note (Signed)
CARDIOLOGY CONSULTATION NOTE.  NAME:  [name]   MRN: [id number] DOB:  July 23, 1930   ADMIT DATE: 05/09/2013  Reason for Consult: Positive Troponin  Requesting Physician: EDP. Truddie Hidden, MD  Primary Care Provider: [name], MD Primary Cardiologist: None Vascular Surgeon: Ruta Hinds, MD  HPI: This is a 80 y.o. male with a past medical history significant for extensive PAD with left femoropopliteal bypass in 1999 preceded by right internal iliac stenting in 1997. He then has had extensive aortic arch and great vessel repair for a mycotic aortic aneurysm following a back surgery with MRSA seeding. 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 He says will he was not have stent grafting of a penetrating ulcer in the descending and thoracic aorta (done at [facility]). He has diabetes on oral medications as well as hypertension and hyperlipidemia. He is being followed by Dr. Joylene Draft, and as far as I can tell he has not had any true cardiac history.  Likely related to his aortic surgery, he had CT scan evidence of an old right occipital lobe stroke.  He was in his usual state of health until last week roughly Monday or Tuesday when both he and his son (his primary caregiver) both developed upper respiratory tract infection with cold stent was somewhat productive of mucus. He was treating this with Tussionex and Mucinex. The cough is certainly improved some, but he then had a bone density procedure done that he usually leaves him fatigued for several days that was done on Thursday of last week. Has been weak since, and his son noted that he was not eating and drinking well yesterday. He denies any subjective fevers or chills, but has had continued cough that seemed to be improving. He notes a little but increased dyspnea but not more than he would expect with a cold. He denies any chest  tightness or pressure with rest or exertion. He states that a few weeks ago he was wearing a monitor that said his heart rate was increased in the 110s, but cannot provide any more information than that. At that time and currently he denies any rapid or irregular heartbeats.  This morning, while getting dressed she was having difficulty with buttoning a button, so he stood up to go to the bathroom to see himself in the near and did not make it to the bathroom. He does not note any loss of consciousness, but does remember himself falling. He does not be tripped, but denies any lightheadedness or dizziness or palpitations/irregular heartbeats preceding it. Her emesis on coming in to check on him after the fall, and was very reluctant to come to the emergency room. Again he denied any chest tightness or pressure, rapid heart beats or irregular heartbeats associated with this fall. Notably, he also denies any claudication symptoms. He does note that yesterday he had some altered vision in his left visual field that was intermittent. He also notes decreased urine output last night and has yet to void this morning. His son noted poor by mouth intake over the weekend.  Besides the left-sided visual changes, he denies any obvious TIA symptoms. No melena, hematochezia or hematuria.  Upon arrival to the emergency room he was noted to have a Troponin level of 8.27 with EKG that was relatively benign showing essentially LVH with repolarization type ST depressions  -- no significant change from prior EKG.  He was also noted to have acute renal insufficiency with a creatinine of 4.32. The emergency room physician has contacted his primary care physician who provided the most recent labs on the patient from July of 2014:  Sodium 140, potassium 5.6, chloride 104, bicarbonate 20, BUN 29, creatinine 1.3, glucose 125.  PMHx:  CARDIAC HISTORY:No record of actual CAD or Cardiac Disease.  Past Medical History  Diagnosis  Date  . Diabetes mellitus, type 2   . Hypertension   . GERD (gastroesophageal reflux disease)   . Osteoporosis, senile   . High cholesterol   . Seasonal allergies   . MRSA infection     s/p '02-no problems now  . Anemia   . Stroke   . Transfusion history     '02- back surgery  . PAD (peripheral artery disease)     s/p L Fem-Pop Bypass; R Iliac PTA  . Left carotid artery occlusion     moderate R carotid disease; L Common Carotid Bypassed during Arch Repair.  . H/O thoracic aortic aneurysm repair     Stent Graft (penetrating ulcer)   Past Surgical History  Procedure Laterality Date  . Back surgery  2001  . Cleft lip repair    . Pr vein bypass graft,aorto-fem-pop  1999  . Joint replacement  2003    Right hip   . Colonoscopy with propofol N/A 08/10/2012    Procedure: COLONOSCOPY WITH PROPOFOL;  Surgeon: Garlan Fair, MD;  Location: [facility];  Service: Endoscopy;  Laterality: N/A;  . Esophagogastroduodenoscopy (egd) with propofol N/A 08/10/2012    Procedure: ESOPHAGOGASTRODUODENOSCOPY (EGD) WITH PROPOFOL;  Surgeon: Garlan Fair, MD;  Location: [facility];  Service: Endoscopy;  Laterality: N/A;  . Iliac artery stent Right     PTA -STENT  . 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 Hemiarthroplasty hip Right   . Thoracic aortic endovascular stent graft  2003    @ [facility]  - for Penetrating Ulcer/Aneurysm.    FAMHx: Family History  Problem Relation Age of Onset  . Diabetes Mother   . Hypertension Mother   . Diabetes Sister   . Heart disease Son     Heart Disease before age 92    SOCHx:  reports that he quit smoking about 12 years ago. He does not have any smokeless tobacco history on file. He reports that he does not drink alcohol or use illicit drugs.  Son is Primary care giver - looking for Daytime Care  assistance.  ALLERGIES: Allergies  Allergen Reactions  . Codeine Nausea And Vomiting   ROS: A comprehensive review of systems was negative except for symptoms in history of present illness and: Generalized weakness and fatigue, that is not unusual initially following bone density testing; recent upper restoration tract infection with persistent cough, that by his report is less productive. Left-sided visual changes as noted above. Poor by mouth intake also noted above. trace edema in lower extremities at baseline.  HOME MEDICATIONS: Patient has a list of medications that would be important to to the chart. He is on both carvedilol and diltiazem, when necessary glimepiride.  (Not in a hospital admission)  HOSPITAL MEDICATIONS: Not admitted.  VITALS: Blood pressure 120/47, pulse 94, temperature 99 F (37.2 C), temperature source Oral, resp. rate 23, height 5\' 9"  (1.753 m), weight  165 lb (74.844 kg), SpO2 98.00%.  PHYSICAL EXAM: General appearance: alert, cooperative, appears stated age, no distress and Difficult to understand speech (not overly unusual per son), she states her questions appropriately HEENT: [place]/AT, EOMI, , anicteric sclera; dry mucous membranes. Mild facial asymmetry, but not clear if this is a facial droop. Neck: JVD - 2 cm above sternal notch, no adenopathy, supple, symmetrical, trachea midline and Bilateral carotid bruits Lungs: Nonlabored, but diffuse rhonchorous breath sounds worse in the left lower and right upper lung fields.; No wheezing or rales. Overall diminished breath sounds. Heart: regular rate and rhythm, S1, S2 normal, no S3 or S4 and Harsh/honking systolic ejection murmur heard at the base radiating to the carotids, but is also heard at the left upper sternal border greater than right upper sternal border. It is crescendo decrescendo mid peaking.;  Abdomen: soft, non-tender; bowel sounds normal; no masses,  no organomegaly Extremities: edema Trace bilateral  lower extremity and no ulcers, gangrene or trophic changes Pulses: Diminished PT and DP pulses bilateral lower extremities Skin: Planning/sweaty to touch. Somewhat warm to touch; no obvious rash or lesions. Neurologic: Mental status: Alert, oriented, thought content appropriate, affect: normal Cranial nerves: No obvious abnormalities, however there is a mild right-sided facial asymmetry Motor: grossly normal  LABS: Results for orders placed during the hospital encounter of 05/09/13 (from the past 24 hour(s))  BASIC METABOLIC PANEL     Status: Abnormal   Collection Time    05/09/13  7:05 AM      Result Value Range   Sodium 135 (*) 137 - 147 mEq/L   Potassium 4.7  3.7 - 5.3 mEq/L   Chloride 100  96 - 112 mEq/L   CO2 15 (*) 19 - 32 mEq/L   Glucose, Bld 147 (*) 70 - 99 mg/dL   BUN 78 (*) 6 - 23 mg/dL   Creatinine, Ser 4.32 (*) 0.50 - 1.35 mg/dL   Calcium 8.4  8.4 - 10.5 mg/dL   GFR calc non Af Amer 12 (*) >90 mL/min   GFR calc Af Amer 14 (*) >90 mL/min  POCT I-STAT TROPONIN I     Status: Abnormal   Collection Time    05/09/13  7:17 AM      Result Value Range   Troponin i, poc 8.27 (*) 0.00 - 0.08 ng/mL   Comment NOTIFIED PHYSICIAN     Comment 3           CBC WITH DIFFERENTIAL     Status: Abnormal   Collection Time    05/09/13  7:39 AM      Result Value Range   WBC 7.0  4.0 - 10.5 K/uL   RBC 3.07 (*) 4.22 - 5.81 MIL/uL   Hemoglobin 10.1 (*) 13.0 - 17.0 g/dL   HCT 29.2 (*) 39.0 - 52.0 %   MCV 95.1  78.0 - 100.0 fL   MCH 32.9  26.0 - 34.0 pg   MCHC 34.6  30.0 - 36.0 g/dL   RDW 12.4  11.5 - 15.5 %   Platelets 97 (*) 150 - 400 K/uL   Neutrophils Relative % 79 (*) 43 - 77 %   Neutro Abs 5.6  1.7 - 7.7 K/uL   Lymphocytes Relative 9 (*) 12 - 46 %   Lymphs Abs 0.6 (*) 0.7 - 4.0 K/uL   Monocytes Relative 11  3 - 12 %   Monocytes Absolute 0.8  0.1 - 1.0 K/uL   Eosinophils Relative 0  0 - 5 %  Eosinophils Absolute 0.0  0.0 - 0.7 K/uL   Basophils Relative 0  0 - 1 %   Basophils  Absolute 0.0  0.0 - 0.1 K/uL  GLUCOSE, CAPILLARY     Status: Abnormal   Collection Time    05/09/13  7:43 AM      Result Value Range   Glucose-Capillary 140 (*) 70 - 99 mg/dL   Comment 1 Documented in Chart     Comment 2 Notify RN     IMAGING: Chest x-ray 05/09/2013: Mild cardiac enlargement with chronic bronchitic changes vague opacity in right upper lobe concerning for possible developing infiltrate.  IMPRESSION: Active Problems:   NSTEMI, initial episode of care   Fever of undetermined origin   Productive cough   Acute renal failure   Pulmonary infiltrate in right lung on chest x-ray   History of right PCA stroke - right occipital   Occlusion and stenosis of carotid artery without mention of cerebral infarction   Hypertension   High cholesterol   PAD (peripheral artery disease)   Diabetes mellitus, type 2  Very unclear situation, in this gentleman with multiple cardiac risk factors who presents status post fall, and was found to be in acute renal failure, likely secondary to poor by mouth intake for several days, who also has evidence of cardiac myonecrosis by troponin levels. He denies any active symptoms of chest tightness or pressure that would suggest acute coronary syndrome. Chest x-ray does not reveal any evidence of CHF which could explain his cough. He has a low-grade fever with possible developing infiltrate in the right upper lobe, however no significant leukocytosis.  RECOMMENDATION: Based on the patient's multiple medical problems including renal insufficiency, low-grade fever possible pneumonia, he would not be in acute revascularization candidate especially with renal failure.  I recommend admitting to [facility] for medical management of possible pneumonia and renal failure along with diabetes.   We will assist with medical management of positive troponin levels/ non-STEMI  At current, the best approach would be a conservative therapy of the positive  troponin levels:  Continue aspirin, statin, continue beta blocker (blood pressure dependent) and IV heparin for 48-72 hours.  Check 2-D echocardiogram to evaluate for EF and potential wall motion abnormalities  Would recommend gentle hydration for possible dehydration related renal failure until we know his ejection fraction.  At present, would not plan on invasive evaluation of coronary disease until stabilized medically with notable improvement of renal function back to baseline.  Time Spent Directly with Patient: 45 minutes  Demeco Ducksworth W, M.D., M.S. [facility] [address] [place], [place]  [id number]  [phone]  05/09/2013 10:24 AM

## 2013-05-09 NOTE — H&P (Signed)
[facility] History and Physical  [name] [id number] DOB: 05/23/1930 DOA: 05/09/2013  Referring physician: Dr Karle Starch.  PCP: [name], MD   Chief Complaint: Fall. SOB.   HPI: [name] is a 80 y.o. male with PMH significant for Hypertension, Diabetes, stroke who presents to ED after a fall. Patient was trying to dressed himself when he fell on the floor. Patient has had decrease mobility for last few days. Patient also relates productive cough, dyspnea for last 6 days. He denies chest pain. He denies fever. His son has been also sick.   Evaluation in the ED; Troponin at 8, cr at 4.3. No prior cr available. Chest x ray; Mild cardiac enlargement and probable chronic bronchitic changes.  The Suspect developing right upper lobe infiltrate.   Review of Systems:  Negative except as per HPI.   Past Medical History  Diagnosis Date  . Diabetes mellitus, type 2   . Hypertension   . GERD (gastroesophageal reflux disease)   . Osteoporosis, senile   . High cholesterol   . Seasonal allergies   . MRSA infection     s/p '02-no problems now  . Anemia   . Stroke   . Transfusion history     '02- back surgery  . PAD (peripheral artery disease)     s/p L Fem-Pop Bypass; R Iliac PTA  . Left carotid artery occlusion     moderate R carotid disease; L Common Carotid Bypassed during Arch Repair.  . H/O thoracic aortic aneurysm repair     Stent Graft (penetrating ulcer)   Past Surgical History  Procedure Laterality Date  . Back surgery  2001  . Cleft lip repair    . Pr vein bypass graft,aorto-fem-pop  1999  . Joint replacement  2003    Right hip   . Colonoscopy with propofol N/A 08/10/2012    Procedure: COLONOSCOPY WITH PROPOFOL;  Surgeon: Garlan Fair, MD;  Location: [facility];  Service: Endoscopy;  Laterality: N/A;  . Esophagogastroduodenoscopy (egd) with propofol N/A 08/10/2012    Procedure: ESOPHAGOGASTRODUODENOSCOPY (EGD) WITH PROPOFOL;  Surgeon: Garlan Fair, MD;  Location: [facility];  Service: Endoscopy;  Laterality: N/A;  . Iliac artery stent Right     PTA -STENT  . 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 Hemiarthroplasty hip Right   . Thoracic aortic endovascular stent graft  2003    @ [facility]  - for Penetrating Ulcer/Aneurysm.   Social History:  reports that he quit smoking about 12 years ago. He does not have any smokeless tobacco history on file. He reports that he does not drink alcohol or use illicit drugs.  Allergies  Allergen Reactions  . Codeine Nausea And Vomiting    Family History  Problem Relation Age of Onset  . Diabetes Mother   . Hypertension Mother   . Diabetes Sister   . Heart disease Son     Heart Disease before age 63     Prior to Admission medications   Medication Sig Start Date End Date Taking? Authorizing Provider  aspirin EC 81 MG tablet Take 81 mg by mouth every morning.    Yes Historical Provider, MD  atorvastatin (LIPITOR) 40 MG tablet Take 20 mg by mouth every morning.    Yes Historical Provider, MD  benazepril (LOTENSIN)  10 MG tablet Take 10 mg by mouth at bedtime.    Yes Historical Provider, MD  carvedilol (COREG) 6.25 MG tablet Take 6.25 mg by mouth 2 (two) times daily with a meal.   Yes Historical Provider, MD  cholecalciferol (VITAMIN D) 1000 UNITS tablet Take 2,000 Units by mouth daily.    Yes Historical Provider, MD  diltiazem (CARDIZEM CD) 240 MG 24 hr capsule Take 240 mg by mouth every morning.    Yes Historical Provider, MD  diphenhydramine-acetaminophen (TYLENOL PM) 25-500 MG TABS Take 1 tablet by mouth at bedtime as needed. For sleep   Yes Historical Provider, MD  ferrous sulfate 325 (65 FE) MG tablet Take 325 mg by mouth daily with breakfast.   Yes Historical Provider, MD  fluticasone (FLONASE) 50 MCG/ACT nasal spray Place 2  sprays into the nose daily.   Yes Historical Provider, MD  furosemide (LASIX) 20 MG tablet Take 20 mg by mouth every morning.    Yes Historical Provider, MD  glimepiride (AMARYL) 2 MG tablet Take 2 mg by mouth daily before breakfast.   Yes Historical Provider, MD  HYDROcodone-acetaminophen (NORCO/VICODIN) 5-325 MG per tablet Take 2 tablets by mouth every 6 (six) hours as needed for pain.    Yes Historical Provider, MD  hydroxypropyl methylcellulose (ISOPTO TEARS) 2.5 % ophthalmic solution Place 1 drop into both eyes daily as needed (dry eyes).    Yes Historical Provider, MD  loratadine (CLARITIN) 10 MG tablet Take 10 mg by mouth daily.   Yes Historical Provider, MD  Multiple Vitamin (MULTIVITAMIN) capsule Take 1 capsule by mouth daily.   Yes Historical Provider, MD  niacin (SLO-NIACIN) 500 MG tablet Take 500 mg by mouth daily.    Yes Historical Provider, MD  Omega-3 Fatty Acids (FISH OIL) 1200 MG CAPS Take 4,800 capsules by mouth every morning.   Yes Historical Provider, MD  omeprazole (PRILOSEC) 20 MG capsule Take 20 mg by mouth daily.   Yes Historical Provider, MD  temazepam (RESTORIL) 30 MG capsule Take 30 mg by mouth at bedtime as needed. For sleep   Yes Historical Provider, MD   Physical Exam: Filed Vitals:   05/09/13 0857  BP: 120/47  Pulse: 94  Temp: 99 F (37.2 C)  Resp: 23    BP 120/47  Pulse 94  Temp(Src) 99 F (37.2 C) (Oral)  Resp 23  Ht 5\' 9"  (1.753 m)  Wt 74.844 kg (165 lb)  BMI 24.36 kg/m2  SpO2 98%  General:  Appears calm and comfortable Eyes: PERRL, normal lids, irises & conjunctiva ENT: grossly normal hearing, lips & tongue Neck: no LAD, masses or thyromegaly Cardiovascular: RRR, no m/r/g. No LE edema. Telemetry: SR, no arrhythmias  Respiratory: bilateral ronchus, no crackles, Normal respiratory effort. Abdomen: soft, ntnd Skin: no rash or induration seen on limited exam Musculoskeletal: grossly normal tone BUE/BLE Psychiatric: grossly normal mood and  affect, speech fluent and appropriate Neurologic: grossly non-focal.          Labs on Admission:  Basic Metabolic Panel:  Recent Labs Lab 05/09/13 0705  NA 135*  K 4.7  CL 100  CO2 15*  GLUCOSE 147*  BUN 78*  CREATININE 4.32*  CALCIUM 8.4   Liver Function Tests: No results found for this basename: AST, ALT, ALKPHOS, BILITOT, PROT, ALBUMIN,  in the last 168 hours No results found for this basename: LIPASE, AMYLASE,  in the last 168 hours No results found for this basename: AMMONIA,  in the last 168 hours CBC:  Recent Labs Lab 05/09/13 0739  WBC 7.0  NEUTROABS 5.6  HGB 10.1*  HCT 29.2*  MCV 95.1  PLT 97*   Cardiac Enzymes: No results found for this basename: CKTOTAL, CKMB, CKMBINDEX, TROPONINI,  in the last 168 hours  BNP (last 3 results) No results found for this basename: PROBNP,  in the last 8760 hours CBG:  Recent Labs Lab 05/09/13 0743  GLUCAP 140*    Radiological Exams on Admission: Dg Chest 2 View  05/09/2013   CLINICAL DATA:  Upper respiratory infection.  EXAM: CHEST  2 VIEW  COMPARISON:  Chest CT 05/30/2010.  FINDINGS: Stable surgical changes with a aortic stent graft. The heart is mildly enlarged. There is tortuosity of the thoracic aorta. Mild peribronchial thickening could reflect bronchitis. Vague opacity in the right upper lobe could be a developing infiltrate. Colonic interposition is noted. The bony thorax is intact.  IMPRESSION: Mild cardiac enlargement and probable chronic bronchitic changes. The  Suspect developing right upper lobe infiltrate.   Electronically Signed   By: Kalman Jewels M.D.   On: 05/09/2013 07:33    EKG: Independently reviewed. ST depression V 4- 74 old.   Assessment/Plan Active Problems:   Occlusion and stenosis of carotid artery without mention of cerebral infarction   Hypertension   High cholesterol   PAD (peripheral artery disease)   Diabetes mellitus, type 2   NSTEMI, initial episode of care   Fever of  undetermined origin   Productive cough   Acute renal failure   NSTEMI (non-ST elevated myocardial infarction)  1-N-STEMI; Troponin on admission at 8. Admit to step down unit. Cardiology following. Started heparin Gtt per cardio recommendation. Aspirin, coreg. Continue to cycle cardiac enzymes. ECHO ordered.   2-Acute renal failure; unclear baseline. Hold ACE, Lasix. Start IV fluids. Check UA. Monitor urine out put. If no improvement could consider Renal US.   3-PNA, URI; presents with cough, ANC 79. Chest x ray developing RUL infiltrates. Will start Ceftriaxone, Azithromycin, Tamiflu. Check influenza PCR. Sputum culture.  4-Diabetes; hold oral hypoglycemic medications. SSI.  5-Hypertension; hold Cardizem SBP soft.   Code Status: Full Code.  Family Communication: Care discussed with son who was at bedside.  Disposition Plan: expect 4 to 5 days.   Time spent: 75 minutes.   [facility] Pager [phone]

## 2013-05-09 NOTE — Progress Notes (Signed)
Pt just got to floor. Pt received CHG bath. Pt is on a heparin drip and came on it per md orders from ed just now. Pt's son is here at bedside. Pt is alert and oriented to self, location and situation. Pt is now resting.

## 2013-05-09 NOTE — ED Notes (Signed)
Called flow to inquire about bed. Received report that beds full and working on cleaning rooms to admit to step-down

## 2013-05-09 NOTE — ED Notes (Signed)
CBG 140  

## 2013-05-09 NOTE — ED Provider Notes (Signed)
CSN: [id number]     Arrival date & time 05/09/13  0618 History   First MD Initiated Contact with Patient 05/09/13 0703     Chief Complaint  Patient presents with  . Shortness of Breath  . Fall   (Consider location/radiation/quality/duration/timing/severity/associated sxs/prior Treatment) Patient is a 80 y.o. male presenting with shortness of breath and fall.  Shortness of Breath Fall Associated symptoms include shortness of breath.   Pt brought to the ED via EMS from home with son who reports the patient has had several days of cough and congestion with mild SOB but no fever. This morning he heard a noise and found the patient on the floor of his bedroom. Pt states he fell while trying to get dressed but denies any syncope, loss of consciousness or head injury. Denies any complaints of pain at the time of my evaluation. Denies having had any CP, palpitations or lightheadedness recently. 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 He has had two aneurysm repairs in the past as well as L fem-pop bypass.   Past Medical History  Diagnosis Date  . Diabetes mellitus   . Hypertension   . GERD (gastroesophageal reflux disease)   . Osteoporosis, senile   . High cholesterol   . Seasonal allergies   . MRSA infection     s/p '02-no problems now  . Anemia   . Staph infection 2001  . Stroke   . Transfusion history     '02- back surgery   Past Surgical History  Procedure Laterality Date  . Back surgery  2001  . Cleft lip repair    . Pr vein bypass graft,aorto-fem-pop  1980  . Joint replacement  2003    Right hip   . Colonoscopy with propofol N/A 08/10/2012    Procedure: COLONOSCOPY WITH PROPOFOL;  Surgeon: Garlan Fair, MD;  Location: [facility];  Service: Endoscopy;  Laterality: N/A;  . Esophagogastroduodenoscopy (egd) with propofol N/A 08/10/2012    Procedure: ESOPHAGOGASTRODUODENOSCOPY (EGD)  WITH PROPOFOL;  Surgeon: Garlan Fair, MD;  Location: [facility];  Service: Endoscopy;  Laterality: N/A;   Family History  Problem Relation Age of Onset  . Diabetes Mother   . Hypertension Mother   . Diabetes Sister   . Heart disease Son     Heart Disease before age 30   History  Substance Use Topics  . Smoking status: Former Smoker    Quit date: 09/24/2000  . Smokeless tobacco: Not on file  . Alcohol Use: No    Review of Systems  Respiratory: Positive for shortness of breath.    All other systems reviewed and are negative except as noted in HPI.   Allergies  Codeine  Home Medications   Current Outpatient Rx  Name  Route  Sig  Dispense  Refill  . aspirin EC 81 MG tablet   Oral   Take 81 mg by mouth every morning.          Marland Kitchen atorvastatin (LIPITOR) 40 MG tablet   Oral   Take 20 mg by mouth every morning.          . benazepril (LOTENSIN) 10 MG tablet   Oral   Take 10 mg by mouth at bedtime.          . carvedilol (COREG) 6.25 MG tablet   Oral   Take 6.25 mg by mouth 2 (two) times daily  with a meal.         . cholecalciferol (VITAMIN D) 1000 UNITS tablet   Oral   Take 2,000 Units by mouth daily.          Marland Kitchen diltiazem (CARDIZEM CD) 240 MG 24 hr capsule   Oral   Take 240 mg by mouth every morning.          . diphenhydramine-acetaminophen (TYLENOL PM) 25-500 MG TABS   Oral   Take 1 tablet by mouth at bedtime as needed. For sleep         . ferrous sulfate 325 (65 FE) MG tablet   Oral   Take 325 mg by mouth daily with breakfast.         . fluticasone (FLONASE) 50 MCG/ACT nasal spray   Nasal   Place 2 sprays into the nose daily.         . furosemide (LASIX) 20 MG tablet   Oral   Take 20 mg by mouth every morning.          Marland Kitchen glimepiride (AMARYL) 2 MG tablet   Oral   Take 2 mg by mouth daily before breakfast.         . HYDROcodone-acetaminophen (NORCO/VICODIN) 5-325 MG per tablet   Oral   Take 2 tablets by mouth every 6 (six)  hours as needed for pain.          . hydroxypropyl methylcellulose (ISOPTO TEARS) 2.5 % ophthalmic solution   Both Eyes   Place 1 drop into both eyes daily as needed (dry eyes).          Marland Kitchen loratadine (CLARITIN) 10 MG tablet   Oral   Take 10 mg by mouth daily.         . Multiple Vitamin (MULTIVITAMIN) capsule   Oral   Take 1 capsule by mouth daily.         . niacin (SLO-NIACIN) 500 MG tablet   Oral   Take 500 mg by mouth daily.          . Omega-3 Fatty Acids (FISH OIL) 1200 MG CAPS   Oral   Take 4,800 capsules by mouth every morning.         Marland Kitchen omeprazole (PRILOSEC) 20 MG capsule   Oral   Take 20 mg by mouth daily.         . temazepam (RESTORIL) 30 MG capsule   Oral   Take 30 mg by mouth at bedtime as needed. For sleep          BP 128/44  Pulse 87  Temp(Src) 98.4 F (36.9 C) (Oral)  Resp 22  Ht 5\' 9"  (1.753 m)  Wt 165 lb (74.844 kg)  BMI 24.36 kg/m2  SpO2 100% Physical Exam  Nursing note and vitals reviewed. Constitutional: He is oriented to person, place, and time. He appears well-developed and well-nourished.  HENT:  Head: Normocephalic and atraumatic.  Eyes: EOM are normal. Pupils are equal, round, and reactive to light.  Neck: Normal range of motion. Neck supple.  Cardiovascular: Normal rate, normal heart sounds and intact distal pulses.   Pulmonary/Chest: Effort normal and breath sounds normal. He has no wheezes. He has no rales.  Abdominal: Bowel sounds are normal. He exhibits no distension. There is no tenderness.  Musculoskeletal: Normal range of motion. He exhibits no edema and no tenderness.  Neurological: He is alert and oriented to person, place, and time. He has normal strength. No cranial nerve deficit or sensory  deficit.  Skin: Skin is warm and dry. No rash noted.  Psychiatric: He has a normal mood and affect.    ED Course  Procedures (including critical care time)  CRITICAL CARE Performed by: Truddie Hidden. Total critical  care time: 35 Critical care time was exclusive of separately billable procedures and treating other patients. Critical care was necessary to treat or prevent imminent or life-threatening deterioration. Critical care was time spent personally by me on the following activities: development of treatment plan with patient and/or surrogate as well as nursing, discussions with consultants, evaluation of patient's response to treatment, examination of patient, obtaining history from patient or surrogate, ordering and performing treatments and interventions, ordering and review of laboratory studies, ordering and review of radiographic studies, pulse oximetry and re-evaluation of patient's condition.   Labs Review Labs Reviewed  BASIC METABOLIC PANEL - Abnormal; Notable for the following:    Sodium 135 (*)    CO2 15 (*)    Glucose, Bld 147 (*)    BUN 78 (*)    Creatinine, Ser 4.32 (*)    GFR calc non Af Amer 12 (*)    GFR calc Af Amer 14 (*)    All other components within normal limits  GLUCOSE, CAPILLARY - Abnormal; Notable for the following:    Glucose-Capillary 140 (*)    All other components within normal limits  POCT I-STAT TROPONIN I - Abnormal; Notable for the following:    Troponin i, poc 8.27 (*)    All other components within normal limits  CBC WITH DIFFERENTIAL  BASIC METABOLIC PANEL  CBC   Imaging Review Dg Chest 2 View  05/09/2013   CLINICAL DATA:  Upper respiratory infection.  EXAM: CHEST  2 VIEW  COMPARISON:  Chest CT 05/30/2010.  FINDINGS: Stable surgical changes with a aortic stent graft. The heart is mildly enlarged. There is tortuosity of the thoracic aorta. Mild peribronchial thickening could reflect bronchitis. Vague opacity in the right upper lobe could be a developing infiltrate. Colonic interposition is noted. The bony thorax is intact.  IMPRESSION: Mild cardiac enlargement and probable chronic bronchitic changes. The  Suspect developing right upper lobe infiltrate.    Electronically Signed   By: Kalman Jewels M.D.   On: 05/09/2013 07:33    EKG Interpretation    Date/Time:  Monday May 09 2013 06:39:27 EST Ventricular Rate:  88 PR Interval:  194 QRS Duration: 96 QT Interval:  307 QTC Calculation: 371 R Axis:   88 Text Interpretation:  Age not entered, assumed to be  80 years old for purpose of ECG interpretation Sinus rhythm Left atrial enlargement Probable anteroseptal infarct, old Repol abnrm, severe global ischemia (LM/MVD) Since last tracing st depression more pronounced Confirmed by [facility]  MD, CHARLES ([id number]) on 05/09/2013 7:04:57 AM            MDM   1. Hypertension   2. High cholesterol   3. PAD (peripheral artery disease)   4. Diabetes mellitus, type 2   5. Elevated troponin   6. Acute renal failure   7. Fall   8. Fever of undetermined origin   9. History of right PCA stroke   10. NSTEMI (non-ST elevated myocardial infarction)     Pt with markedly elevated trop, abnormal EKG but atypical story for ACS. Discussed with Cardiology PA who will ask one of their MDs to evaluate the patient. Will hold off on Heparin pending their eval, may need additional imaging prior to anticoagulation due to history  of aortic grafting. Pt is relatively complaint free now, resting comfortably.   Charles B. Karle Starch, MD 05/09/13 1538

## 2013-05-09 NOTE — ED Notes (Signed)
MD at bedside. Dr Harding 

## 2013-05-09 NOTE — ED Notes (Signed)
Per EMS pt has had a bad cough since Tuesday, per EMS pt states he is coughing up "junk". Pt received one breathing tx via EMS (5mg  of albuterol) en route to the department. EMS reports the pt has had a low grade fever the past few days. EMS reports pt has a cardiac hx. EMS reports the pt fell of of his stool this morning, EMS reports the pt fell onto the floor in his home and landed on his left side. EMS reports the pt did not lose consciousness. Pt is A&O x2. Pt reoriented to time upon initial assessment. Pt denies pain. Pt placed on 2L 02 for comfort upon arrival to department.

## 2013-05-10 DIAGNOSIS — J9600 Acute respiratory failure, unspecified whether with hypoxia or hypercapnia: Secondary | ICD-10-CM

## 2013-05-10 DIAGNOSIS — E860 Dehydration: Secondary | ICD-10-CM | POA: Diagnosis present

## 2013-05-10 DIAGNOSIS — I214 Non-ST elevation (NSTEMI) myocardial infarction: Principal | ICD-10-CM

## 2013-05-10 DIAGNOSIS — J101 Influenza due to other identified influenza virus with other respiratory manifestations: Secondary | ICD-10-CM | POA: Diagnosis present

## 2013-05-10 DIAGNOSIS — J9601 Acute respiratory failure with hypoxia: Secondary | ICD-10-CM | POA: Diagnosis present

## 2013-05-10 DIAGNOSIS — I059 Rheumatic mitral valve disease, unspecified: Secondary | ICD-10-CM

## 2013-05-10 LAB — BASIC METABOLIC PANEL
BUN: 55 mg/dL — ABNORMAL HIGH (ref 6–23)
CALCIUM: 8.4 mg/dL (ref 8.4–10.5)
CHLORIDE: 109 meq/L (ref 96–112)
CO2: 17 meq/L — AB (ref 19–32)
Creatinine, Ser: 2.39 mg/dL — ABNORMAL HIGH (ref 0.50–1.35)
GFR calc Af Amer: 28 mL/min — ABNORMAL LOW (ref >90)
GFR calc non Af Amer: 24 mL/min — ABNORMAL LOW (ref >90)
GLUCOSE: 166 mg/dL — AB (ref 70–99)
POTASSIUM: 4.5 meq/L (ref 3.7–5.3)
SODIUM: 142 meq/L (ref 137–147)

## 2013-05-10 LAB — CBC
HCT: 28.6 % — ABNORMAL LOW (ref 39.0–52.0)
HEMOGLOBIN: 10 g/dL — AB (ref 13.0–17.0)
MCH: 32.9 pg (ref 26.0–34.0)
MCHC: 35 g/dL (ref 30.0–36.0)
MCV: 94.1 fL (ref 78.0–100.0)
Platelets: 103 10*3/uL — ABNORMAL LOW (ref 150–400)
RBC: 3.04 MIL/uL — AB (ref 4.22–5.81)
RDW: 12.2 % (ref 11.5–15.5)
WBC: 7 10*3/uL (ref 4.0–10.5)

## 2013-05-10 LAB — HEPARIN LEVEL (UNFRACTIONATED)
Heparin Unfractionated: <0.1 IU/mL — ABNORMAL LOW (ref 0.30–0.70)
Heparin Unfractionated: 0.14 IU/mL — ABNORMAL LOW (ref 0.30–0.70)
Heparin Unfractionated: 0.28 IU/mL — ABNORMAL LOW (ref 0.30–0.70)

## 2013-05-10 LAB — GLUCOSE, CAPILLARY
GLUCOSE-CAPILLARY: 185 mg/dL — AB (ref 70–99)
GLUCOSE-CAPILLARY: 190 mg/dL — AB (ref 70–99)
Glucose-Capillary: 266 mg/dL — ABNORMAL HIGH (ref 70–99)
Glucose-Capillary: 226 mg/dL — ABNORMAL HIGH (ref 70–99)

## 2013-05-10 LAB — PRO B NATRIURETIC PEPTIDE: PRO B NATRI PEPTIDE: 18246 pg/mL — AB (ref 0–450)

## 2013-05-10 MED ORDER — OSELTAMIVIR PHOSPHATE 30 MG PO CAPS
30.0000 mg | ORAL_CAPSULE | Freq: Every day | ORAL | Status: DC
  Administered 2013-05-11: 30 mg via ORAL
  Filled 2013-05-10: qty 1

## 2013-05-10 MED ORDER — HEPARIN BOLUS VIA INFUSION
2000.0000 [IU] | Freq: Once | INTRAVENOUS | Status: AC
  Administered 2013-05-10: 2000 [IU] via INTRAVENOUS
  Filled 2013-05-10: qty 2000

## 2013-05-10 MED ORDER — CARVEDILOL 12.5 MG PO TABS
12.5000 mg | ORAL_TABLET | Freq: Two times a day (BID) | ORAL | Status: DC
  Administered 2013-05-11 – 2013-05-13 (×5): 12.5 mg via ORAL
  Filled 2013-05-10 (×7): qty 1

## 2013-05-10 NOTE — Progress Notes (Signed)
  Echocardiogram 2D Echocardiogram has been performed.  Basilia Jumbo 05/10/2013, 9:29 AM

## 2013-05-10 NOTE — Progress Notes (Signed)
Subjective: Deneis CP  Breathing is OK   Objective: Filed Vitals:   05/10/13 0000 05/10/13 0026 05/10/13 0100 05/10/13 0432  BP: 147/73 147/73 146/67 150/70  Pulse: 119 115 116 115  Temp:  98.3 F (36.8 C)  99.1 F (37.3 C)  TempSrc:  Oral  Oral  Resp: 31 31 29 25   Height:      Weight:    168 lb 6.9 oz (76.4 kg)  SpO2: 94% 95% 93% 96%   Weight change: 1 lb 3.6 oz (0.557 kg)  Intake/Output Summary (Last 24 hours) at 05/10/13 0748 Last data filed at 05/09/13 2307  Gross per 24 hour  Intake   1188 ml  Output   1075 ml  Net    113 ml    General: Alert, awake, oriented x3, in no acute distress Neck:  JVP is normal Heart: Regular rate and rhythm, without murmurs, rubs, gallops.  Lungs: Clear to auscultation.  No rales or wheezes. Exemities:  Tr edema.   Neuro: Grossly intact, nonfocal.  Tele:  ST Lab Results: Results for orders placed during the hospital encounter of 05/09/13 (from the past 24 hour(s))  URINALYSIS, ROUTINE W REFLEX MICROSCOPIC     Status: Abnormal   Collection Time    05/09/13 10:12 AM      Result Value Range   Color, Urine YELLOW  YELLOW   APPearance HAZY (*) CLEAR   Specific Gravity, Urine 1.021  1.005 - 1.030   pH 5.0  5.0 - 8.0   Glucose, UA NEGATIVE  NEGATIVE mg/dL   Hgb urine dipstick NEGATIVE  NEGATIVE   Bilirubin Urine NEGATIVE  NEGATIVE   Ketones, ur NEGATIVE  NEGATIVE mg/dL   Protein, ur 30 (*) NEGATIVE mg/dL   Urobilinogen, UA 0.2  0.0 - 1.0 mg/dL   Nitrite NEGATIVE  NEGATIVE   Leukocytes, UA NEGATIVE  NEGATIVE  URINE MICROSCOPIC-ADD ON     Status: Abnormal   Collection Time    05/09/13 10:12 AM      Result Value Range   Squamous Epithelial / LPF RARE  RARE   WBC, UA 0-2  <3 WBC/hpf   Bacteria, UA RARE  RARE   Casts HYALINE CASTS (*) NEGATIVE   Urine-Other MUCOUS PRESENT    TROPONIN I     Status: Abnormal   Collection Time    05/09/13 10:55 AM      Result Value Range   Troponin I 2.97 (*) <0.30 ng/mL  APTT     Status: None   Collection Time    05/09/13 10:55 AM      Result Value Range   aPTT 34  24 - 37 seconds  PROTIME-INR     Status: None   Collection Time    05/09/13 10:55 AM      Result Value Range   Prothrombin Time 13.8  11.6 - 15.2 seconds   INR 1.08  0.00 - 1.49  INFLUENZA PANEL BY PCR (TYPE A & B, H1N1)     Status: Abnormal   Collection Time    05/09/13 11:17 AM      Result Value Range   Influenza A By PCR POSITIVE (*) NEGATIVE   Influenza B By PCR NEGATIVE  NEGATIVE   H1N1 flu by pcr NOT DETECTED  NOT DETECTED  GLUCOSE, CAPILLARY     Status: Abnormal   Collection Time    05/09/13 12:15 PM      Result Value Range   Glucose-Capillary 168 (*) 70 - 99 mg/dL  Comment 1 Documented in Chart     Comment 2 Notify RN    TROPONIN I     Status: Abnormal   Collection Time    05/09/13  4:24 PM      Result Value Range   Troponin I 2.53 (*) <0.30 ng/mL  GLUCOSE, CAPILLARY     Status: Abnormal   Collection Time    05/09/13  5:11 PM      Result Value Range   Glucose-Capillary 224 (*) 70 - 99 mg/dL   Comment 1 Notify RN    MRSA PCR SCREENING     Status: None   Collection Time    05/09/13  5:17 PM      Result Value Range   MRSA by PCR NEGATIVE  NEGATIVE  HEPARIN LEVEL (UNFRACTIONATED)     Status: Abnormal   Collection Time    05/09/13  7:53 PM      Result Value Range   Heparin Unfractionated <0.10 (*) 0.30 - 0.70 IU/mL  GLUCOSE, CAPILLARY     Status: Abnormal   Collection Time    05/09/13  8:03 PM      Result Value Range   Glucose-Capillary 238 (*) 70 - 99 mg/dL   Comment 1 Notify RN    TROPONIN I     Status: Abnormal   Collection Time    05/09/13 10:30 PM      Result Value Range   Troponin I 1.55 (*) <0.30 ng/mL  HEPARIN LEVEL (UNFRACTIONATED)     Status: Abnormal   Collection Time    05/10/13  2:58 AM      Result Value Range   Heparin Unfractionated <0.10 (*) 0.30 - 0.70 IU/mL  CBC     Status: Abnormal   Collection Time    05/10/13  2:58 AM      Result Value Range   WBC 7.0  4.0  - 10.5 K/uL   RBC 3.04 (*) 4.22 - 5.81 MIL/uL   Hemoglobin 10.0 (*) 13.0 - 17.0 g/dL   HCT 28.6 (*) 39.0 - 52.0 %   MCV 94.1  78.0 - 100.0 fL   MCH 32.9  26.0 - 34.0 pg   MCHC 35.0  30.0 - 36.0 g/dL   RDW 12.2  11.5 - 15.5 %   Platelets 103 (*) 150 - 400 K/uL  BASIC METABOLIC PANEL     Status: Abnormal   Collection Time    05/10/13  2:58 AM      Result Value Range   Sodium 142  137 - 147 mEq/L   Potassium 4.5  3.7 - 5.3 mEq/L   Chloride 109  96 - 112 mEq/L   CO2 17 (*) 19 - 32 mEq/L   Glucose, Bld 166 (*) 70 - 99 mg/dL   BUN 55 (*) 6 - 23 mg/dL   Creatinine, Ser 2.39 (*) 0.50 - 1.35 mg/dL   Calcium 8.4  8.4 - 10.5 mg/dL   GFR calc non Af Amer 24 (*) >90 mL/min   GFR calc Af Amer 28 (*) >90 mL/min    Studies/Results: @[id number] @ EStudy Conclusions  - Left ventricle: Septal and inferior wall hypokinesis The cavity size was mildly dilated. Wall thickness was normal. Systolic function was mildly reduced. The estimated ejection fraction was in the range of 45% to 50%. - Mitral valve: Mild regurgitation. - Left atrium: The atrium was mildly dilated. - Atrial septum: No defect or patent foramen ovale was identified. - Impressions: No vegetations appreciated Image quality suboptimal Impressions:  -  No vegetations appreciated Image quality suboptimal No cardiac source of emboli was indentified. Transthoracic echocardiography. M-mode, complete 2D, spectral Doppler, and color Doppler. Height: Height: 165.1cm. Height: 65in. Weight: Weight: 76.2kg. Weight: 167.7lb. Body mass index: BMI: 28kg/m^2. Body surface area: BSA: 1.12m^2. Blood pressure: 132/75. Patient status: Inpatient. Location: ICU/CCU  ------------------------------------------------------------  ------------------------------------------------------------ Left ventricle: Septal and inferior wall hypokinesis The cavity size was mildly dilated. Wall thickness was normal. Systolic function was mildly reduced. The  estimated ejection fraction was in the range of 45% to 50%.  ------------------------------------------------------------ Aortic valve: Mildly calcified leaflets. Doppler: There was no stenosis. No significant regurgitation.  ------------------------------------------------------------ Mitral valve: Mildly thickened leaflets . Doppler: Mild regurgitation.  ------------------------------------------------------------ Left atrium: The atrium was mildly dilated.  ------------------------------------------------------------ Atrial septum: No defect or patent foramen ovale was identified.  ------------------------------------------------------------ Right ventricle: The cavity size was normal. Wall thickness was normal. Systolic function was normal.  ------------------------------------------------------------ Pulmonic valve: Doppler: Trivial regurgitation.  ------------------------------------------------------------ Tricuspid valve: Doppler: Mild regurgitation.  ------------------------------------------------------------ Right atrium: The atrium was normal in size.  ------------------------------------------------------------ Pericardium: The pericardium was normal in appearance.   cho 2/10  Medications: Reviewed   @PROBHOSP @  1.  NSTEMI  Troponin is falling  Echo with LV dysfunction  Once stabilizes would recomm L heart cath to defne anatomy.   Would increase b blocker Rx with tachycardia  BP should tolerate 2.  CKD  Cr is improving. Follow     3.  ID  Patient being treating for influenza and bacterial infections.    4.  DM  5.  HL    LOS: 1 day   Dorris Carnes 05/10/2013, 7:48 AM

## 2013-05-10 NOTE — Progress Notes (Signed)
[facility]  [name] EHU:[id number] DOB: July 27, 1930 DOA: 05/09/2013 PCP: [name], MD  Brief narrative: 80 year old male patient with past medical history of hypertension diabetes and stroke. Presented to the emergency department after experiencing a fall. Patient was trying to dress himself he apparently fell onto the floor. Family noted patient with decreased mobility for several days. In addition he has had productive cough and dyspnea for 6 days. No chest pain no fever. Endorses on also sick with unknown illness.  Emergency department his troponin was elevated at 8.0 and his creatinine was 4.3. Chest x-ray was concerning for chronic bronchitic changes and possibly an early evolving right upper lobe infiltrate. Despite a normal troponin EKG was unremarkable cardiology was consulted in the emergency department.  Assessment/Plan: Active Problems:   NSTEMI (non-ST elevated myocardial infarction) -Per cardiology noting best approach conservative therapy -Continue aspirin, statin, beta blocker and heparin for a total of 48-72 hours -2-D echocardiogram with septal and inferior wall hypokinesis with mild reduced systolic function EF 78-58%. -No plans to pursue invasive evaluation coronary disease at least until stabilized medically with marked improvement in renal function -Troponin trends down now at 1.55 that he may have a degree of demand ischemia as well as abnormal findings related to acute renal failure    Influenza A with respiratory manifestations/Acute respiratory failure with hypoxia -Suspected etiology to respiratory symptoms -Continue supportive care including Tamiflu -Continue empiric antibiotics to cover for community-acquired pneumonia since could have a secondary bacterial infection related to influenza    Hypertension  -BP controlled    Diabetes mellitus, type 2 -CBGs improving and now less than 200 -Continue SSI -Was on  Amaryl prior to admission    Acute renal failure  - has had significant decrease in BUN and creatinine as compared to admission noting creatinine now 2.39 - Noted back in 2003 his creatinine was 1.2 but no recent for comparison -Follow electrolytes    Dehydration -Continue IV fluid    Occlusion and stenosis of carotid artery without mention of cerebral infarction    High cholesterol    PAD (peripheral artery disease)   DVT prophylaxis: IV heparin infusion Code Status: Full Family Communication: Spoke with son at bedside Disposition Plan/Expected LOS: Stepdown   Consultants: Cardiology  Procedures: 2-D echocardiogram pending  Antibiotics: Rocephin 2/9 >>> Zithromax 2/9 >>>  HPI/Subjective: Patient awakened without any specific complaints.  Objective: Blood pressure 132/75, pulse 119, temperature 98.2 F (36.8 C), temperature source Oral, resp. rate 24, height 5\' 5"  (1.651 m), weight 168 lb 6.9 oz (76.4 kg), SpO2 94.00%.  Intake/Output Summary (Last 24 hours) at 05/10/13 1059 Last data filed at 05/10/13 0600  Gross per 24 hour  Intake   2655 ml  Output   1075 ml  Net   1580 ml     Exam: General: No acute respiratory distress Lungs: Coarse to auscultation bilaterally without wheezes or crackles, 2L Cardiovascular: Regular tachycardic rate and rhythm without murmur gallop or rub normal S1 and S2, no peripheral edema or JVD Abdomen: Nontender, nondistended, soft, bowel sounds positive, no rebound, no ascites, no appreciable mass Musculoskeletal: No significant cyanosis, clubbing of bilateral lower extremities Neurological: Awake, moves all extremities x 4 without focal neurological deficits, CN 2-12 intact  Scheduled Meds:  Scheduled Meds: . aspirin EC  81 mg Oral q morning - 10a  . atorvastatin  20 mg Oral q morning - 10a  . azithromycin  500 mg Intravenous Q24H  .  carvedilol  6.25 mg Oral BID WC  . cefTRIAXone (ROCEPHIN)  IV  1 g Intravenous Q24H  .  cholecalciferol  2,000 Units Oral Daily  . docusate sodium  100 mg Oral BID  . ferrous sulfate  325 mg Oral Q breakfast  . fluticasone  2 spray Each Nare Daily  . insulin aspart  0-9 Units Subcutaneous TID WC  . loratadine  10 mg Oral Daily  . niacin  500 mg Oral Daily  . [START ON 05/11/2013] oseltamivir  30 mg Oral Daily  . pantoprazole  40 mg Oral Daily   Continuous Infusions: . sodium chloride 100 mL/hr at 05/09/13 2200  . heparin 1,150 Units/hr (05/10/13 0437)    Data Reviewed: Basic Metabolic Panel:  Recent Labs Lab 05/09/13 0705 05/10/13 0258  NA 135* 142  K 4.7 4.5  CL 100 109  CO2 15* 17*  GLUCOSE 147* 166*  BUN 78* 55*  CREATININE 4.32* 2.39*  CALCIUM 8.4 8.4   Liver Function Tests: No results found for this basename: AST, ALT, ALKPHOS, BILITOT, PROT, ALBUMIN,  in the last 168 hours No results found for this basename: LIPASE, AMYLASE,  in the last 168 hours No results found for this basename: AMMONIA,  in the last 168 hours CBC:  Recent Labs Lab 05/09/13 0739 05/10/13 0258  WBC 7.0 7.0  NEUTROABS 5.6  --   HGB 10.1* 10.0*  HCT 29.2* 28.6*  MCV 95.1 94.1  PLT 97* 103*   Cardiac Enzymes:  Recent Labs Lab 05/09/13 1055 05/09/13 1624 05/09/13 2230  TROPONINI 2.97* 2.53* 1.55*   BNP (last 3 results) No results found for this basename: PROBNP,  in the last 8760 hours CBG:  Recent Labs Lab 05/09/13 0743 05/09/13 1215 05/09/13 1711 05/09/13 2003 05/10/13 0753  GLUCAP 140* 168* 224* 238* 185*    Recent Results (from the past 240 hour(s))  MRSA PCR SCREENING     Status: None   Collection Time    05/09/13  5:17 PM      Result Value Range Status   MRSA by PCR NEGATIVE  NEGATIVE Final   Comment:            The GeneXpert MRSA Assay (FDA     approved for NASAL specimens     only), is one component of a     comprehensive MRSA colonization     surveillance program. It is not     intended to diagnose MRSA     infection nor to guide or      monitor treatment for     MRSA infections.     Studies:  Recent x-ray studies have been reviewed in detail by the Attending Physician  Time spent :     Erin Hearing, [place] [facility] Office  [phone] Pager [phone]  **If unable to reach the above provider after paging please contact the [place] @ [phone]  On-Call/Text Page:      [email or web address]      password [email or web address]  If 7PM-7AM, please contact night-coverage [email or web address] Password [email or web address] 05/10/2013, 10:59 AM   LOS: 1 day   I have examined the patient, reviewed the chart and modified the above note which I agree with.   Natalye Kott,MD [phone] 05/10/2013, 4:55 PM

## 2013-05-10 NOTE — Progress Notes (Addendum)
CSW received text from [facility] that pt likely will need SNF when ready for discharge--PT/OT ordered to eval and treat. CSW following case and will read PT/OT note for recommendation after pt has been evaluated for disposition.  Addendum: PT/OT recommending home health, and CSW called pt's son Randall Hiss as RNCM explained Randall Hiss is unable to physically assist pt at discharge. CSW explained CSW will call PT to inquire about changing recommendation to SNF in the event pt needs assistance from son and son is unable to provide it. CSW spoke with PT, and provider explained PT spoke with pt and his daughter today about the fact that pt is ambulating well and does not need physical assistance. Family thinking about having an aide in the home to help with ADLs, and PT explained this sounds like a good idea. CSW called son back and recounted conversation with PT. Son agreed to home health and asked for a list of agencies; CSW called RNCM, who will provide pt's daughter with this list.  Addendum: CSW signing off, as plan is home health.  Ky Barban, MSW, [facility] Clinical Social Worker [phone]

## 2013-05-10 NOTE — Progress Notes (Signed)
Utilization Review Completed.  

## 2013-05-10 NOTE — Evaluation (Signed)
Physical Therapy Evaluation Patient Details Name: RHYS LICHTY MRN: [id number] DOB: [birth year] Today's Date: 05/10/2013 Time: 1207-1227 PT Time Calculation (min): 20 min  PT Assessment / Plan / Recommendation History of Present Illness  80 year old male patient with past medical history of hypertension diabetes and stroke. Presented to the emergency department after experiencing a fall. Patient was trying to dress himself he apparently fell onto the floor. Family noted patient with decreased mobility for several days. In addition he has had productive cough and dyspnea for 6 days.Pt flu (+), NSTEMI   Clinical Impression  Pt very pleasant and fatigued today with activity but able to mobilize better than he or dgtr thought he would. Pt normally mod I with activity and ADLs and anticipate he will be able to return to this level with increased mobility and therapy acutely to address below deficits and decrease burden of care.       PT Assessment  Patient needs continued PT services    Follow Up Recommendations  Home health PT;Supervision - Intermittent    Does the patient have the potential to tolerate intense rehabilitation      Barriers to Discharge Decreased caregiver support      Equipment Recommendations  None recommended by PT    Recommendations for Other Services     Frequency Min 3X/week    Precautions / Restrictions Precautions Precautions: Fall   Pertinent Vitals/Pain sats on RA 91-95, 97% on 1L end of session HR 113-118 No pain     Mobility  Bed Mobility Overal bed mobility: Modified Independent General bed mobility comments: increased time Transfers Overall transfer level: Needs assistance Transfers: Sit to/from Stand Sit to Stand: Supervision General transfer comment: cueing for safety and assist for management of lines Ambulation/Gait Ambulation/Gait assistance: Min guard Ambulation Distance (Feet): 40 Feet Assistive device: Rolling walker (2  wheeled) Gait Pattern/deviations: Step-through pattern;Decreased stride length;Trunk flexed Gait velocity interpretation: Below normal speed for age/gender General Gait Details: cueing to step into RW and extend trunk pt walks with excessive flexion and unable to keep self inside RW    Exercises General Exercises - Lower Extremity Long Arc Quad: AROM;Both;10 reps;Seated Hip Flexion/Marching: AROM;Both;10 reps;Seated   PT Diagnosis: Difficulty walking  PT Problem List: Decreased activity tolerance;Decreased mobility;Decreased strength;Decreased knowledge of use of DME PT Treatment Interventions: DME instruction;Gait training;Therapeutic activities;Therapeutic exercise;Patient/family education;Functional mobility training     PT Goals(Current goals can be found in the care plan section) Acute Rehab PT Goals Patient Stated Goal: return home PT Goal Formulation: With patient/family Time For Goal Achievement: 05/24/13 Potential to Achieve Goals: Good  Visit Information  Last PT Received On: 05/10/13 Assistance Needed: +1 History of Present Illness: 79 year old male patient with past medical history of hypertension diabetes and stroke. Presented to the emergency department after experiencing a fall. Patient was trying to dress himself he apparently fell onto the floor. Family noted patient with decreased mobility for several days. In addition he has had productive cough and dyspnea for 6 days.Pt flu (+), NSTEMI       Prior Functioning  Home Living Family/patient expects to be discharged to:: Private residence Living Arrangements: Children Available Help at Discharge: Available PRN/intermittently Type of Home: [place]: One [place]: Environmental consultant - 2 wheels;Cane - single point;Shower seat Prior Function Level of Independence: Independent with assistive device(s) Comments: pt does all his own ADLs including cooking and  driving Communication Communication: HOH    Cognition  Cognition Arousal/Alertness: Awake/alert Behavior During  Therapy: WFL for tasks assessed/performed Overall Cognitive Status: Within Functional Limits for tasks assessed    Extremity/Trunk Assessment Upper Extremity Assessment Upper Extremity Assessment: Overall WFL for tasks assessed Lower Extremity Assessment Lower Extremity Assessment: Generalized weakness Cervical / Trunk Assessment Cervical / Trunk Assessment: Kyphotic   Balance Balance Overall balance assessment: History of Falls;Needs assistance Sitting balance-Leahy Scale: Good Standing balance-Leahy Scale: Poor  End of Session PT - End of Session Equipment Utilized During Treatment: Gait belt Activity Tolerance: Patient tolerated treatment well;Patient limited by fatigue Patient left: in chair;with call bell/phone within reach Nurse Communication: Mobility status  GP     Lanetta Inch Beth 05/10/2013, 1:08 PM [name], [place]

## 2013-05-10 NOTE — Progress Notes (Signed)
[place] for Heparin  Indication: chest pain/ACS  Allergies  Allergen Reactions  . Codeine Nausea And Vomiting    Patient Measurements: Height: 5\' 5"  (165.1 cm) Weight: 168 lb 6.9 oz (76.4 kg) IBW/kg (Calculated) : 61.5  Vital Signs: Temp: 98.1 F (36.7 C) (02/10 2031) Temp src: Oral (02/10 2031) BP: 154/65 mmHg (02/10 2031) Pulse Rate: 113 (02/10 2031)  Labs:  Recent Labs  05/09/13 0705 05/09/13 0739 05/09/13 1055 05/09/13 1624  05/09/13 2230 05/10/13 0258 05/10/13 1200 05/10/13 2235  HGB  --  10.1*  --   --   --   --  10.0*  --   --   HCT  --  29.2*  --   --   --   --  28.6*  --   --   PLT  --  97*  --   --   --   --  103*  --   --   APTT  --   --  34  --   --   --   --   --   --   LABPROT  --   --  13.8  --   --   --   --   --   --   INR  --   --  1.08  --   --   --   --   --   --   HEPARINUNFRC  --   --   --   --   < >  --  <0.10* 0.14* 0.28*  CREATININE 4.32*  --   --   --   --   --  2.39*  --   --   TROPONINI  --   --  2.97* 2.53*  --  1.55*  --   --   --   < > = values in this interval not displayed.  Estimated Creatinine Clearance: 23.5 ml/min (by C-G formula based on Cr of 2.39).  Assessment: 80 y/o Male with NSTEMI for heparin Goal of Therapy:  Heparin level 0.3-0.7 units/ml Monitor platelets by anticoagulation protocol: Yes   Plan:  Increase Heparin 1500 units/hr Follow-up am labs.  Devaeh Amadi, Bronson Curb 05/10/2013,11:52 PM

## 2013-05-10 NOTE — Progress Notes (Signed)
   CARE MANAGEMENT NOTE 05/10/2013  Patient:  [name], [name]   Account Number:  [id number]  Date Initiated:  05/10/2013  Documentation initiated by:  [facility]  Subjective/Objective Assessment:   N-STEMI, Acute Kidney Failure     Action/Plan:   lives at home with son, [name] #[phone]   Anticipated DC Date:     Anticipated DC Plan:  [place] referral  Clinical Social Worker      DC Planning Services  CM consult      Choice offered to / List presented to:             Status of service:  In process, will continue to follow Medicare Important Message given?   (If response is "NO", the following Medicare IM given date fields will be blank) Date Medicare IM given:   Date Additional Medicare IM given:    Discharge Disposition:    Per UR Regulation:    If discussed at Long Length of Stay Meetings, dates discussed:    Comments:  05/10/2013 0900 NCM spoke to pt's son, [name]. Son states he lives at home with pt. Pt was independent at home prior to admission. He is limited in lifting and bathing pt due to his health. He is interested in SNF-rehab. States he believes pt has [place] that will help pay for an aide in the home once he lives SNF. Will provide son with private duty list. States he was looking at following up with [facility]. Has RW, and shower chair at home. Jonnie Finner RN CCM Case Mgmt phone [phone]

## 2013-05-10 NOTE — Progress Notes (Signed)
ANTICOAGULATION CONSULT NOTE - Follow Up Consult  Pharmacy Consult for Heparin  Indication: chest pain/ACS  Allergies  Allergen Reactions  . Codeine Nausea And Vomiting    Patient Measurements: Height: 5\' 5"  (165.1 cm) Weight: 166 lb 3.6 oz (75.4 kg) IBW/kg (Calculated) : 61.5  Vital Signs: Temp: 98.3 F (36.8 C) (02/10 0026) Temp src: Oral (02/10 0026) BP: 146/67 mmHg (02/10 0100) Pulse Rate: 116 (02/10 0100)  Labs:  Recent Labs  05/09/13 0705 05/09/13 0739 05/09/13 1055 05/09/13 1624 05/09/13 1953 05/09/13 2230 05/10/13 0258  HGB  --  10.1*  --   --   --   --  10.0*  HCT  --  29.2*  --   --   --   --  28.6*  PLT  --  97*  --   --   --   --  103*  APTT  --   --  34  --   --   --   --   LABPROT  --   --  13.8  --   --   --   --   INR  --   --  1.08  --   --   --   --   HEPARINUNFRC  --   --   --   --  <0.10*  --  <0.10*  CREATININE 4.32*  --   --   --   --   --  2.39*  TROPONINI  --   --  2.97* 2.53*  --  1.55*  --     Estimated Creatinine Clearance: 23.4 ml/min (by C-G formula based on Cr of 2.39).   Medications:  Heparin 900 units/hr  Assessment: 80 y/o M on heparin for elevated troponin. HL is undetectable this AM. Other labs as above. No issues with infusion per RN.   Goal of Therapy:  Heparin level 0.3-0.7 units/ml Monitor platelets by anticoagulation protocol: Yes   Plan:  -Increase heparin drip to 1150 units/hr -1230 HL -Daily CBC/HL -Monitor for bleeding  Narda Bonds 05/10/2013,4:31 AM

## 2013-05-10 NOTE — Progress Notes (Signed)
Pharmacy Note-Anticoagulation  Pharmacy Consult :  80 y.o. male is currently on Heparin infusion for chest pain/ACS and + Troponins.   Heparin dosing weight: 75 kg  Latest Labs : Hematology :  Recent Labs  05/09/13 0705 05/09/13 0739 05/09/13 1055 05/09/13 1953 05/10/13 0258 05/10/13 1200  HGB  --  10.1*  --   --  10.0*  --   HCT  --  29.2*  --   --  28.6*  --   PLT  --  97*  --   --  103*  --   APTT  --   --  34  --   --   --   LABPROT  --   --  13.8  --   --   --   INR  --   --  1.08  --   --   --   HEPARINUNFRC  --   --   --  <0.10* <0.10* 0.14*  CREATININE 4.32*  --   --   --  2.39*  --     Lab Results  Component Value Date   INR 1.08 05/09/2013        HEPARINUNFRC 0.14* 05/10/2013   HEPARINUNFRC <0.10* 05/10/2013   HEPARINUNFRC <0.10* 05/09/2013        HGB 10.0* 05/10/2013   HGB 10.1* 05/09/2013    Current Medication[s] Include: Scheduled:  Scheduled:  . aspirin EC  81 mg Oral q morning - 10a  . atorvastatin  20 mg Oral q morning - 10a  . azithromycin  500 mg Intravenous Q24H  . carvedilol  6.25 mg Oral BID WC  . cefTRIAXone (ROCEPHIN)  IV  1 g Intravenous Q24H  . cholecalciferol  2,000 Units Oral Daily  . docusate sodium  100 mg Oral BID  . ferrous sulfate  325 mg Oral Q breakfast  . fluticasone  2 spray Each Nare Daily  . insulin aspart  0-9 Units Subcutaneous TID WC  . loratadine  10 mg Oral Daily  . niacin  500 mg Oral Daily  . [START ON 05/11/2013] oseltamivir  30 mg Oral Daily  . pantoprazole  40 mg Oral Daily   Infusion[s]: Infusions:  . sodium chloride 100 mL/hr at 05/09/13 2200  . heparin 1,150 Units/hr (05/10/13 0437)   Antibiotic[s]: Anti-infectives   Start     Dose/Rate Route Frequency Ordered Stop   05/11/13 1000  oseltamivir (TAMIFLU) capsule 30 mg     30 mg Oral Daily 05/10/13 0948 05/14/13 0959   05/09/13 1030  oseltamivir (TAMIFLU) capsule 75 mg  Status:  Discontinued     75 mg Oral 2 times daily 05/09/13 1019 05/10/13 0948   05/09/13  1030  cefTRIAXone (ROCEPHIN) 1 g in dextrose 5 % 50 mL IVPB     1 g 100 mL/hr over 30 Minutes Intravenous Every 24 hours 05/09/13 1019     05/09/13 1030  azithromycin (ZITHROMAX) 500 mg in dextrose 5 % 250 mL IVPB     500 mg 250 mL/hr over 60 Minutes Intravenous Every 24 hours 05/09/13 1019        Assessment :  Heparin level remains SUBtherapeutic despite dose increases.  Heparin level 0.14 units/hr.  No bleeding complications observed.  Goal :  Heparin goal is Heparin level 0.3-0.7 units/ml.  Plan : 1. Will give Heparin bolus 2000 units, then increase the Heparin rate to 1400 units/hr.   The next Heparin Level will be due in 8 hours @ 2300 pm 2. Daily  Heparin level, CBC while on Heparin.  Monitor for bleeding complications.   Nayana Lenig, Craig Guess, Pharm.D. 05/10/2013  2:23 PM

## 2013-05-10 NOTE — Progress Notes (Signed)
Inpatient Diabetes Program Recommendations  AACE/ADA: New Consensus Statement on Inpatient Glycemic Control (2013)  Target Ranges:  Prepandial:   less than 140 mg/dL      Peak postprandial:   less than 180 mg/dL (1-2 hours)      Critically ill patients:  140 - 180 mg/dL  Results for TAHER, VANNOTE (MRN [id number]) as of 05/10/2013 07:53  Ref. Range 05/09/2013 07:43 05/09/2013 12:15 05/09/2013 17:11 05/09/2013 20:03  Glucose-Capillary Latest Range: 70-99 mg/dL 140 (H) 168 (H) 224 (H) 238 (H)   Inpatient Diabetes Program Recommendations Diet: add carbohydrate modified to current heart healthy diet Thank you  Raoul Pitch BSN, RN,CDE Inpatient Diabetes Coordinator [phone] (team pager)

## 2013-05-11 ENCOUNTER — Inpatient Hospital Stay (HOSPITAL_COMMUNITY): Payer: Medicare Other

## 2013-05-11 DIAGNOSIS — J111 Influenza due to unidentified influenza virus with other respiratory manifestations: Secondary | ICD-10-CM

## 2013-05-11 DIAGNOSIS — I509 Heart failure, unspecified: Secondary | ICD-10-CM

## 2013-05-11 DIAGNOSIS — Z515 Encounter for palliative care: Secondary | ICD-10-CM

## 2013-05-11 DIAGNOSIS — I5023 Acute on chronic systolic (congestive) heart failure: Secondary | ICD-10-CM

## 2013-05-11 LAB — BASIC METABOLIC PANEL
BUN: 36 mg/dL — ABNORMAL HIGH (ref 6–23)
BUN: 36 mg/dL — AB (ref 6–23)
CALCIUM: 8.3 mg/dL — AB (ref 8.4–10.5)
CALCIUM: 8.8 mg/dL (ref 8.4–10.5)
CO2: 19 meq/L (ref 19–32)
CO2: 18 mEq/L — ABNORMAL LOW (ref 19–32)
CREATININE: 1.76 mg/dL — AB (ref 0.50–1.35)
Chloride: 109 mEq/L (ref 96–112)
Chloride: 103 mEq/L (ref 96–112)
Creatinine, Ser: 1.68 mg/dL — ABNORMAL HIGH (ref 0.50–1.35)
GFR calc non Af Amer: 35 mL/min — ABNORMAL LOW (ref >90)
GFR, EST AFRICAN AMERICAN: 40 mL/min — AB (ref >90)
GFR, EST AFRICAN AMERICAN: 43 mL/min — AB (ref >90)
GFR, EST NON AFRICAN AMERICAN: 37 mL/min — AB (ref >90)
Glucose, Bld: 291 mg/dL — ABNORMAL HIGH (ref 70–99)
Glucose, Bld: 226 mg/dL — ABNORMAL HIGH (ref 70–99)
Potassium: 5.1 mEq/L (ref 3.7–5.3)
Potassium: 4 mEq/L (ref 3.7–5.3)
Sodium: 143 mEq/L (ref 137–147)
Sodium: 139 mEq/L (ref 137–147)

## 2013-05-11 LAB — CBC
HCT: 30.3 % — ABNORMAL LOW (ref 39.0–52.0)
Hemoglobin: 10.9 g/dL — ABNORMAL LOW (ref 13.0–17.0)
MCH: 33.9 pg (ref 26.0–34.0)
MCHC: 36 g/dL (ref 30.0–36.0)
MCV: 94.1 fL (ref 78.0–100.0)
Platelets: 148 10*3/uL — ABNORMAL LOW (ref 150–400)
RBC: 3.22 MIL/uL — ABNORMAL LOW (ref 4.22–5.81)
RDW: 12.3 % (ref 11.5–15.5)
WBC: 12 10*3/uL — ABNORMAL HIGH (ref 4.0–10.5)

## 2013-05-11 LAB — GLUCOSE, CAPILLARY
GLUCOSE-CAPILLARY: 251 mg/dL — AB (ref 70–99)
GLUCOSE-CAPILLARY: 317 mg/dL — AB (ref 70–99)
Glucose-Capillary: 205 mg/dL — ABNORMAL HIGH (ref 70–99)
Glucose-Capillary: 198 mg/dL — ABNORMAL HIGH (ref 70–99)

## 2013-05-11 LAB — HEPARIN LEVEL (UNFRACTIONATED): Heparin Unfractionated: 0.29 IU/mL — ABNORMAL LOW (ref 0.30–0.70)

## 2013-05-11 LAB — TROPONIN I
TROPONIN I: 1.43 ng/mL — AB (ref <0.30)
TROPONIN I: 1.33 ng/mL — AB (ref <0.30)
Troponin I: 1.47 ng/mL (ref <0.30)

## 2013-05-11 MED ORDER — NITROGLYCERIN 0.4 MG SL SUBL
SUBLINGUAL_TABLET | SUBLINGUAL | Status: AC
  Administered 2013-05-11: 0.4 mg
  Filled 2013-05-11: qty 25

## 2013-05-11 MED ORDER — FUROSEMIDE 10 MG/ML IJ SOLN
80.0000 mg | Freq: Once | INTRAMUSCULAR | Status: AC
  Administered 2013-05-11: 80 mg via INTRAVENOUS
  Filled 2013-05-11: qty 8

## 2013-05-11 MED ORDER — FUROSEMIDE 10 MG/ML IJ SOLN
INTRAMUSCULAR | Status: AC
  Filled 2013-05-11: qty 4

## 2013-05-11 MED ORDER — MORPHINE SULFATE 2 MG/ML IJ SOLN: 1.0000 mg | INTRAMUSCULAR | Status: DC | PRN

## 2013-05-11 MED ORDER — NITROGLYCERIN IN D5W 200-5 MCG/ML-% IV SOLN
10.0000 ug/min | INTRAVENOUS | Status: DC
  Administered 2013-05-11: 10 ug/min via INTRAVENOUS

## 2013-05-11 MED ORDER — NITROGLYCERIN IN D5W 200-5 MCG/ML-% IV SOLN
INTRAVENOUS | Status: AC
  Administered 2013-05-11: 10 ug/min via INTRAVENOUS
  Filled 2013-05-11: qty 250

## 2013-05-11 MED ORDER — METOPROLOL TARTRATE 1 MG/ML IV SOLN: 2.5000 mg | INTRAVENOUS | Status: DC | PRN

## 2013-05-11 MED ORDER — HYDRALAZINE HCL 20 MG/ML IJ SOLN: 10.0000 mg | INTRAMUSCULAR | Status: DC | PRN

## 2013-05-11 MED ORDER — HEPARIN (PORCINE) IN NACL 100-0.45 UNIT/ML-% IJ SOLN
1650.0000 [IU]/h | INTRAMUSCULAR | Status: DC
  Administered 2013-05-11 – 2013-05-12 (×2): 1650 [IU]/h via INTRAVENOUS
  Filled 2013-05-11 (×3): qty 250

## 2013-05-11 MED ORDER — METOPROLOL TARTRATE 1 MG/ML IV SOLN
INTRAVENOUS | Status: AC
  Administered 2013-05-11: 5 mg
  Filled 2013-05-11: qty 5

## 2013-05-11 MED ORDER — OSELTAMIVIR PHOSPHATE 30 MG PO CAPS
30.0000 mg | ORAL_CAPSULE | Freq: Two times a day (BID) | ORAL | Status: DC
  Administered 2013-05-11 – 2013-05-12 (×2): 30 mg via ORAL
  Filled 2013-05-11 (×3): qty 1

## 2013-05-11 MED ORDER — DILTIAZEM HCL 60 MG PO TABS
60.0000 mg | ORAL_TABLET | Freq: Four times a day (QID) | ORAL | Status: DC
  Administered 2013-05-11 – 2013-05-12 (×3): 60 mg via ORAL
  Filled 2013-05-11 (×8): qty 1

## 2013-05-11 MED ORDER — METOPROLOL TARTRATE 1 MG/ML IV SOLN: 5.0000 mg | Freq: Once | INTRAVENOUS | Status: DC

## 2013-05-11 MED ORDER — FUROSEMIDE 10 MG/ML IJ SOLN
80.0000 mg | Freq: Two times a day (BID) | INTRAMUSCULAR | Status: DC
Start: 2013-05-11 — End: 2013-05-13
  Administered 2013-05-11 – 2013-05-13 (×4): 80 mg via INTRAVENOUS
  Filled 2013-05-11 (×6): qty 8

## 2013-05-11 MED ORDER — FUROSEMIDE 10 MG/ML IJ SOLN
INTRAMUSCULAR | Status: AC
  Administered 2013-05-11: 40 mg
  Filled 2013-05-11: qty 4

## 2013-05-11 MED ORDER — FUROSEMIDE 10 MG/ML IJ SOLN
40.0000 mg | Freq: Once | INTRAMUSCULAR | Status: AC
  Administered 2013-05-11: 40 mg via INTRAVENOUS

## 2013-05-11 MED ORDER — FUROSEMIDE 10 MG/ML IJ SOLN: 40.0000 mg | Freq: Once | INTRAMUSCULAR | Status: DC

## 2013-05-11 NOTE — Progress Notes (Signed)
Upon hourly assessment, pt appears uncomfortable, shallow breathing, lungs sounds more wet than prior assessment; more tachycardiac & hypertensive than before;  pt placed on bipap per RT; MD made aware

## 2013-05-11 NOTE — Care Management (Signed)
Pt placed on BiPAP 14/6 back up rate 10 and 100% FIO2. HR coming down Pt looks more comfortable at this time. Waiting on transfer to ICU.

## 2013-05-11 NOTE — Progress Notes (Signed)
Subjective: No CP  SOme SOB Objective: Filed Vitals:   05/11/13 0500 05/11/13 0600 05/11/13 0700 05/11/13 0800  BP: 142/78 120/55 153/81 166/84  Pulse:      Temp:    98.3 F (36.8 C)  TempSrc:    Oral  Resp: 27 24 21 26   Height:      Weight:      SpO2: 100% 98% 99% 100%   Weight change: -9 lb 3.6 oz (-4.185 kg)  Intake/Output Summary (Last 24 hours) at 05/11/13 0810 Last data filed at 05/11/13 0800  Gross per 24 hour  Intake   2569 ml  Output   1950 ml  Net    619 ml    General: Alert, awake, oriented x3  Some labored breathing  Neck:  JVP is increased Heart: Regular rate and rhythm, without murmurs, rubs, gallops.  Lungs: rhonchi   Exemities:  No edema.   Neuro: Grossly intact, nonfocal.  TeLe:  ST 110s Lab Results: Results for orders placed during the hospital encounter of 05/09/13 (from the past 24 hour(s))  HEPARIN LEVEL (UNFRACTIONATED)     Status: Abnormal   Collection Time    05/10/13 12:00 PM      Result Value Ref Range   Heparin Unfractionated 0.14 (*) 0.30 - 0.70 IU/mL  GLUCOSE, CAPILLARY     Status: Abnormal   Collection Time    05/10/13 12:35 PM      Result Value Ref Range   Glucose-Capillary 190 (*) 70 - 99 mg/dL   Comment 1 Notify RN    GLUCOSE, CAPILLARY     Status: Abnormal   Collection Time    05/10/13  5:25 PM      Result Value Ref Range   Glucose-Capillary 266 (*) 70 - 99 mg/dL   Comment 1 Notify RN    PRO B NATRIURETIC PEPTIDE     Status: Abnormal   Collection Time    05/10/13  6:55 PM      Result Value Ref Range   Pro B Natriuretic peptide (BNP) 18246.0 (*) 0 - 450 pg/mL  GLUCOSE, CAPILLARY     Status: Abnormal   Collection Time    05/10/13  9:09 PM      Result Value Ref Range   Glucose-Capillary 226 (*) 70 - 99 mg/dL  HEPARIN LEVEL (UNFRACTIONATED)     Status: Abnormal   Collection Time    05/10/13 10:35 PM      Result Value Ref Range   Heparin Unfractionated 0.28 (*) 0.30 - 0.70 IU/mL  HEPARIN LEVEL (UNFRACTIONATED)     Status:  Abnormal   Collection Time    05/11/13  4:35 AM      Result Value Ref Range   Heparin Unfractionated 0.29 (*) 0.30 - 0.70 IU/mL  CBC     Status: Abnormal   Collection Time    05/11/13  4:35 AM      Result Value Ref Range   WBC 12.0 (*) 4.0 - 10.5 K/uL   RBC 3.22 (*) 4.22 - 5.81 MIL/uL   Hemoglobin 10.9 (*) 13.0 - 17.0 g/dL   HCT 30.3 (*) 39.0 - 52.0 %   MCV 94.1  78.0 - 100.0 fL   MCH 33.9  26.0 - 34.0 pg   MCHC 36.0  30.0 - 36.0 g/dL   RDW 12.3  11.5 - 15.5 %   Platelets 148 (*) 150 - 400 K/uL  BASIC METABOLIC PANEL     Status: Abnormal   Collection Time  05/11/13  4:35 AM      Result Value Ref Range   Sodium 143  137 - 147 mEq/L   Potassium 5.1  3.7 - 5.3 mEq/L   Chloride 109  96 - 112 mEq/L   CO2 19  19 - 32 mEq/L   Glucose, Bld 291 (*) 70 - 99 mg/dL   BUN 36 (*) 6 - 23 mg/dL   Creatinine, Ser 1.76 (*) 0.50 - 1.35 mg/dL   Calcium 8.3 (*) 8.4 - 10.5 mg/dL   GFR calc non Af Amer 35 (*) >90 mL/min   GFR calc Af Amer 40 (*) >90 mL/min  TROPONIN I     Status: Abnormal   Collection Time    05/11/13  4:35 AM      Result Value Ref Range   Troponin I 1.43 (*) <0.30 ng/mL    Studies/Results: @RISRSLT24 @  Medications:Reviewed   @PROBHOSP @  1.  Acute on chronic systolic CHF  Note events of last night.  Patient developed acute decompensation after IV fluids yesterday.  I would continue diuresis today  Patient renal function has improve  Will follow closely.  2.  NSTEMI  Plan, once he stabilizes from his other medical problems for L heart cath. Empirically on heparin.  Will reassess today  3.  Influenza A    4.  CKD  Follow  5   HTN  Add back NTG gtt for now.    PAD    LOS: 2 days   Dorris Carnes 05/11/2013, 8:10 AM

## 2013-05-11 NOTE — Progress Notes (Signed)
Per MD verbal order, hold 80mg  IV lasix until pt's BP decreases to more normal range with use of NTG gtt; will continue to monitor closely and update as needed

## 2013-05-11 NOTE — Progress Notes (Signed)
Mr Power called out saying he was having trouble breathing. Went to room and found him trying to get up and sit on the side of the bed. Coughing up frothy sputum. Turned oxygen up to 6L but oxygen saturation stayed at 88%. HR up to 150. SBP of 180. Pt had increased rhonchi, sounded really wet. Since the computers were down, I pushed the button to call [facility]. Dr Jamal Collin was available to give me orders and assumed patient care. Pt got nitroglycerin sublingual, metropolol IV, Furosemide and felt a lot better one hour later. Pt transferred to [place]. Nurses will assume care in that unit.  Ladell Heads, RN

## 2013-05-11 NOTE — Progress Notes (Signed)
See other note

## 2013-05-11 NOTE — Progress Notes (Signed)
More dyspneic, on NRB now 'Ready to meet my maker' Would not want life support -' I have told my children about it.' Bp remains high on NTG gtt  O/E BL scattered crackles Able to talk in full sentences  Imp - Influenza vs asymmetric pulm edema  Recomm - DNR issued Morphine prn resp distress, bipap prn only - agrees to use this cardizem po 60 q 6h Adjusted tamiflu dose Lopressor/ hydralazine prn with parameters Can dc heparin after 48h total, Tp trending down  Addn cc time x 52mins  ALVA,RAKESH V.

## 2013-05-11 NOTE — Progress Notes (Signed)
Inpatient Diabetes Program Recommendations  AACE/ADA: New Consensus Statement on Inpatient Glycemic Control (2013)  Target Ranges:  Prepandial:   less than 140 mg/dL      Peak postprandial:   less than 180 mg/dL (1-2 hours)      Critically ill patients:  140 - 180 mg/dL   Reason for Visit: Results for HOY, FALLERT (MRN [id number]) as of 05/11/2013 10:48  Ref. Range 05/10/2013 07:53 05/10/2013 12:35 05/10/2013 17:25 05/10/2013 21:09 05/11/2013 08:07  Glucose-Capillary Latest Range: 70-99 mg/dL 185 (H) 190 (H) 266 (H) 226 (H) 251 (H)    Inpatient Diabetes Program Recommendations Diet: add carbohydrate modified to current heart healthy diet Also may consider adding low dose basal insulin, Levemir 10 units daily.  Thanks, Adah Perl, RN, BC-ADM Inpatient Diabetes Coordinator Pager [phone]

## 2013-05-11 NOTE — Progress Notes (Signed)
Called by staff nurse on the floor regarding patient's desaturation and high heart rate. Upon arrival to room, [facility] NP, CCM NP and RT at the bedside placing patient on BIPAP. Bedside RN preparing patient for transfer to ICU. No further assistance needed, advised bedside RN to call if needed.

## 2013-05-11 NOTE — Consult Note (Signed)
Name: [name] MRN: [id number] DOB: 12/29/1930    ADMISSION DATE:  05/09/2013 CONSULTATION DATE:  2/11  REFERRING MD :  Dema Severin  PRIMARY SERVICE: [facility]-->PCCM   CHIEF COMPLAINT:  Acute resp failure   BRIEF PATIENT DESCRIPTION:  80 year old male admitted on 2/9 w/ influenza A +/- CAP, dehydration, acute renal failure, and NSTEMI. Treated w/ ABX, tamiflu  and medical conservative therapy re: NSTEMI. PCCM called emergently to bedside early in am hours 2/11 for hypertensive crisis, tachycardia, and acute resp failure.  Heavy ex smoker ,quit 2002 after TAA repair Amedeo Plenty)  SIGNIFICANT EVENTS / STUDIES:  2/9 Admitted w/ working dx of influenza A +/- CAP c/b NSTEMI.  2/10 ECHO: EF 40-45%, inf wall hypokinesis, LA mildly dilated    LINES / TUBES:   CULTURES: mrsa PCR 2/9: neg Influenza A 2/9: pos   ANTIBIOTICS: azithro 2/9>>> Rocephin 2/9>>> tamiflu 2/9>>>  HISTORY OF PRESENT ILLNESS:   80 y.o. male with PMH significant for Hypertension, Diabetes, stroke who presented  to ED on 2/9 after a fall. Per chart review pt had leading up to the fall productive cough and progressive dyspnea for last 6 days prior w/ associated decreased mobility. He denied chest pain. He denies fever. His son had been also sick. Evaluation in the ED; Troponin at 8, cr at 4.3. No prior cr available. Chest x ray; Mild cardiac enlargement and probable chronic bronchitic changes and developing right upper lobe infiltrate. He was admitted to the SDU setting w/ working dx of CAP and associated NSTEMI. He was started on CAP coverage as well as Tamiflu, in addition influenza panel was + for influenza A. Cardiology was consulted and recommended conservative therapy. It was felt that volume depletion was responsible for his acute on chronic renal failure, so IVF were administered. As of 2/10 his troponins were trending down, his creatinine had improved, and things were looking better. Around 0200 the am on 2/11 he  developed sudden onset of tachycardia, HTN, hypoxia and acute respiratory failure. PCCM was called emergently to bedside. Therapeutic interventions included: IV lopressor X2, IV lasix, SL NTG, and application of NIPPV. He was transferred to the ICU for further treatment, with concern that he may require intubation.    PAST MEDICAL HISTORY :  Past Medical History  Diagnosis Date  . Diabetes mellitus, type 2   . Hypertension   . GERD (gastroesophageal reflux disease)   . Osteoporosis, senile   . High cholesterol   . Seasonal allergies   . MRSA infection     s/p '02-no problems now  . Anemia   . Stroke   . Transfusion history     '02- back surgery  . PAD (peripheral artery disease)     s/p L Fem-Pop Bypass; R Iliac PTA  . Left carotid artery occlusion     moderate R carotid disease; L Common Carotid Bypassed during Arch Repair.  . H/O thoracic aortic aneurysm repair     Stent Graft (penetrating ulcer)   Past Surgical History  Procedure Laterality Date  . Back surgery  2001  . Cleft lip repair    . Pr vein bypass graft,aorto-fem-pop  1999  . Joint replacement  2003    Right hip   . Colonoscopy with propofol N/A 08/10/2012    Procedure: COLONOSCOPY WITH PROPOFOL;  Surgeon: Garlan Fair, MD;  Location: [facility];  Service: Endoscopy;  Laterality: N/A;  . Esophagogastroduodenoscopy (egd) with propofol N/A 08/10/2012    Procedure: ESOPHAGOGASTRODUODENOSCOPY (  EGD) WITH PROPOFOL;  Surgeon: Garlan Fair, MD;  Location: [facility];  Service: Endoscopy;  Laterality: N/A;  . Iliac artery stent Right     PTA -STENT  . 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 Hemiarthroplasty hip Right   . Thoracic aortic endovascular stent graft  2003    @ [facility]  - for Penetrating Ulcer/Aneurysm.  . Vascular surgery     Prior to  Admission medications   Medication Sig Start Date End Date Taking? Authorizing Provider  aspirin EC 81 MG tablet Take 81 mg by mouth every morning.    Yes Historical Provider, MD  atorvastatin (LIPITOR) 40 MG tablet Take 20 mg by mouth every morning.    Yes Historical Provider, MD  benazepril (LOTENSIN) 10 MG tablet Take 10 mg by mouth at bedtime.    Yes Historical Provider, MD  carvedilol (COREG) 6.25 MG tablet Take 6.25 mg by mouth 2 (two) times daily with a meal.   Yes Historical Provider, MD  cholecalciferol (VITAMIN D) 1000 UNITS tablet Take 2,000 Units by mouth daily.    Yes Historical Provider, MD  diltiazem (CARDIZEM CD) 240 MG 24 hr capsule Take 240 mg by mouth every morning.    Yes Historical Provider, MD  diphenhydramine-acetaminophen (TYLENOL PM) 25-500 MG TABS Take 1 tablet by mouth at bedtime as needed. For sleep   Yes Historical Provider, MD  ferrous sulfate 325 (65 FE) MG tablet Take 325 mg by mouth daily with breakfast.   Yes Historical Provider, MD  fluticasone (FLONASE) 50 MCG/ACT nasal spray Place 2 sprays into the nose daily.   Yes Historical Provider, MD  furosemide (LASIX) 20 MG tablet Take 20 mg by mouth every morning.    Yes Historical Provider, MD  glimepiride (AMARYL) 2 MG tablet Take 2 mg by mouth daily before breakfast.   Yes Historical Provider, MD  HYDROcodone-acetaminophen (NORCO/VICODIN) 5-325 MG per tablet Take 2 tablets by mouth every 6 (six) hours as needed for pain.    Yes Historical Provider, MD  hydroxypropyl methylcellulose (ISOPTO TEARS) 2.5 % ophthalmic solution Place 1 drop into both eyes daily as needed (dry eyes).    Yes Historical Provider, MD  loratadine (CLARITIN) 10 MG tablet Take 10 mg by mouth daily.   Yes Historical Provider, MD  Multiple Vitamin (MULTIVITAMIN) capsule Take 1 capsule by mouth daily.   Yes Historical Provider, MD  niacin (SLO-NIACIN) 500 MG tablet Take 500 mg by mouth daily.    Yes Historical Provider, MD  Omega-3 Fatty Acids (FISH  OIL) 1200 MG CAPS Take 4,800 capsules by mouth every morning.   Yes Historical Provider, MD  omeprazole (PRILOSEC) 20 MG capsule Take 20 mg by mouth daily.   Yes Historical Provider, MD  temazepam (RESTORIL) 30 MG capsule Take 30 mg by mouth at bedtime as needed. For sleep   Yes Historical Provider, MD   Allergies  Allergen Reactions  . Codeine Nausea And Vomiting    FAMILY HISTORY:  Family History  Problem Relation Age of Onset  . Diabetes Mother   . Hypertension Mother   . Diabetes Sister   . Heart disease Son     Heart Disease before age 29   SOCIAL HISTORY:  reports that he quit smoking about 12 years ago. He does not have any smokeless tobacco  history on file. He reports that he does not drink alcohol or use illicit drugs.  REVIEW OF SYSTEMS:  dyspnea better w/ application of NIPPV  A comprehensive review of systems was negative except for symptoms in history of present illness and: Generalized weakness and fatigue,recent URI with persistent cough, that by his report is less productive.   SUBJECTIVE:  Critically ill  VITAL SIGNS: Temp:  [98 F (36.7 C)-99.1 F (37.3 C)] 98 F (36.7 C) (02/10 2354) Pulse Rate:  [94-119] 109 (02/10 2354) Resp:  [21-28] 25 (02/10 2354) BP: (132-165)/(65-121) 161/77 mmHg (02/10 2354) SpO2:  [92 %-96 %] 94 % (02/10 2354) Weight:  [76.4 kg (168 lb 6.9 oz)] 76.4 kg (168 lb 6.9 oz) (02/10 0432) HEMODYNAMICS:   VENTILATOR SETTINGS:   INTAKE / OUTPUT: Intake/Output     02/10 0701 - 02/11 0700   P.O. 240   Total Intake(mL/kg) 240 (3.1)   Urine (mL/kg/hr) 1325 (0.7)   Total Output 1325   Net -1085       Urine Occurrence 2 x   Stool Occurrence 3 x     PHYSICAL EXAMINATION: General:  Acutely ill appearing 80 year old male, now off NIPPV.  Neuro:  Awake, oriented, anxious  HEENT:  [place], + JVD BIPAP mask in place  Cardiovascular:  Tachy rrr  Lungs:  Diffuse rales, marked accessory muscle use, improved w/ application on NIPPV  Abdomen:   No OM. + bowel sounds  Musculoskeletal:  Intact  Skin:  Intact   LABS:  CBC  Recent Labs Lab 05/09/13 0739 05/10/13 0258  WBC 7.0 7.0  HGB 10.1* 10.0*  HCT 29.2* 28.6*  PLT 97* 103*   Coag's  Recent Labs Lab 05/09/13 1055  APTT 34  INR 1.08   BMET  Recent Labs Lab 05/09/13 0705 05/10/13 0258  NA 135* 142  K 4.7 4.5  CL 100 109  CO2 15* 17*  BUN 78* 55*  CREATININE 4.32* 2.39*  GLUCOSE 147* 166*   Electrolytes  Recent Labs Lab 05/09/13 0705 05/10/13 0258  CALCIUM 8.4 8.4   Sepsis Markers No results found for this basename: LATICACIDVEN, PROCALCITON, O2SATVEN,  in the last 168 hours ABG No results found for this basename: PHART, PCO2ART, PO2ART,  in the last 168 hours Liver Enzymes No results found for this basename: AST, ALT, ALKPHOS, BILITOT, ALBUMIN,  in the last 168 hours Cardiac Enzymes  Recent Labs Lab 05/09/13 1055 05/09/13 1624 05/09/13 2230 05/10/13 1855  TROPONINI 2.97* 2.53* 1.55*  --   PROBNP  --   --   --  18246.0*   Glucose  Recent Labs Lab 05/09/13 1711 05/09/13 2003 05/10/13 0753 05/10/13 1235 05/10/13 1725 05/10/13 2109  GLUCAP 224* 238* 185* 190* 266* 226*    Imaging Dg Chest 2 View  05/09/2013   CLINICAL DATA:  Upper respiratory infection.  EXAM: CHEST  2 VIEW  COMPARISON:  Chest CT 05/30/2010.  FINDINGS: Stable surgical changes with a aortic stent graft. The heart is mildly enlarged. There is tortuosity of the thoracic aorta. Mild peribronchial thickening could reflect bronchitis. Vague opacity in the right upper lobe could be a developing infiltrate. Colonic interposition is noted. The bony thorax is intact.  IMPRESSION: Mild cardiac enlargement and probable chronic bronchitic changes. The  Suspect developing right upper lobe infiltrate.   Electronically Signed   By: Kalman Jewels M.D.   On: 05/09/2013 07:33     CXR: marked worsening of R>L bilateral airspace disease. Asymmetric edema superimposed on PNA  ASSESSMENT / PLAN:  PULMONARY A:acute respiratory failure in setting of flash pulmonary edema superimposed on underlying influenza A and CAP (NOS).  >CXR w/ marked progression of R>L airspace disease -favor pna vs asymmetric edema P:    Trial NIPPV--> while we add IV diuresis, and NTG for preload reduction, if no improvement quickly will need intubation    CARDIOVASCULAR A:  NSTEMI Hypertensive crisis  Acute decompensated systolic heart failure  Tachycardia  P:  KVO IVF IV beta beta blocker PRN for HR >120 IV lasix IV ntg gtt w/ goal for preload reduction and SBP goal <140 Recycle CEs Cont asa, statin and scheduled BB, heparin for another 24-48hrs  Transfer to ICU Additional recs per cards.   RENAL A:   Acute renal failure: scr improving  (no clear what his baseline is as his last recorded creatinine was in 123456)  Metabolic acidosis + AG, in setting of acute renal failure: improving  P:   F/u chemistry  KVO IVF as now in hypertensive crisis/volume overload   GASTROINTESTINAL A:  No acute  P:   NPO for now   HEMATOLOGIC A:  Mild anemia  P:  Trend cbc Cont heparin gtt   INFECTIOUS A:   Influenza And CAP (presume bacterial super-infection) P:   Cont resp isolation  Cont tamiflu and CAP coverage   ENDOCRINE A:  DM P:   ssi   NEUROLOGIC A:  Anxiety in setting of resp distress  P:   Supportive care   TODAY'S SUMMARY:  Around 0200 the am on 2/11 he developed sudden onset of tachycardia, HTN, hypoxia and acute respiratory failure. PCCM was called emergently to bedside. Therapeutic interventions included: IV lopressor X2, IV lasix, SL NTG, and application of NIPPV. He was transferred to the ICU for further treatment, with concern that he may require intubation. Responded to bipap & BP lowering, has transitioned to [place]. CXR is asymmetric but clinically favor pulmonary edema. Needs cardiac cath once acute issues resolved, cr improved.    I have personally  obtained a history, examined the patient, evaluated laboratory and imaging results, formulated the assessment and plan and placed orders. CRITICAL CARE: The patient is critically ill with multiple organ systems failure and requires high complexity decision making for assessment and support, frequent evaluation and titration of therapies, application of advanced monitoring technologies and extensive interpretation of multiple databases. Critical Care Time devoted to patient care services described in this note is 60 minutes.   Rigoberto Noel  Pulmonary and [place] Pager: [phone]  05/11/2013, 2:11 AM

## 2013-05-11 NOTE — Progress Notes (Signed)
This NP notified by Dr. Hal Hope that PCCM was involved in pt's care due to an emergent situation. RN found pt struggling to breathe and pushed camera button for ELink. NP to bedside. Jerrye Bushy, NP for PCCM at bedside directing care. PCCM has assumed care from [facility] and will transfer pt to ICU. Clance Boll, NP [facility]

## 2013-05-11 NOTE — Progress Notes (Signed)
ANTICOAGULATION CONSULT NOTE - Follow Up Consult  Pharmacy Consult for Heparin Indication: chest pain/ACS  Allergies  Allergen Reactions  . Codeine Nausea And Vomiting    Patient Measurements: Height: 5\' 5"  (165.1 cm) Weight: 157 lb (71.215 kg) IBW/kg (Calculated) : 61.5 Heparin Dosing Weight: 71kg  Vital Signs: Temp: 98.3 F (36.8 C) (02/11 0800) Temp src: Oral (02/11 0800) BP: 166/84 mmHg (02/11 0800) Pulse Rate: 103 (02/11 0437)  Labs:  Recent Labs  05/09/13 0705  05/09/13 0739  05/09/13 1055 05/09/13 1624  05/09/13 2230 05/10/13 0258 05/10/13 1200 05/10/13 2235 05/11/13 0435  HGB  --   < > 10.1*  --   --   --   --   --  10.0*  --   --  10.9*  HCT  --   --  29.2*  --   --   --   --   --  28.6*  --   --  30.3*  PLT  --   --  97*  --   --   --   --   --  103*  --   --  148*  APTT  --   --   --   --  34  --   --   --   --   --   --   --   LABPROT  --   --   --   --  13.8  --   --   --   --   --   --   --   INR  --   --   --   --  1.08  --   --   --   --   --   --   --   HEPARINUNFRC  --   --   --   --   --   --   < >  --  <0.10* 0.14* 0.28* 0.29*  CREATININE 4.32*  --   --   --   --   --   --   --  2.39*  --   --  1.76*  TROPONINI  --   --   --   < > 2.97* 2.53*  --  1.55*  --   --   --  1.43*  < > = values in this interval not displayed.  Estimated Creatinine Clearance: 29.1 ml/min (by C-G formula based on Cr of 1.76).   Medications:  Heparin @ 1500 units/hr  Assessment: 82yom continues on heparin for NSTEMI. Heparin level is still slightly below goal despite rate increase last night. CBC is stable. No bleeding reported. Noted plan for cath once medical problems stabilize.  Goal of Therapy:  Heparin level 0.3-0.7 units/ml Monitor platelets by anticoagulation protocol: Yes   Plan:  1) Increase heparin to 1650 units/hr 2) Follow up heparin level, CBC in AM  Deboraha Sprang 05/11/2013,9:00 AM

## 2013-05-12 ENCOUNTER — Inpatient Hospital Stay (HOSPITAL_COMMUNITY): Payer: Medicare Other

## 2013-05-12 DIAGNOSIS — J9600 Acute respiratory failure, unspecified whether with hypoxia or hypercapnia: Secondary | ICD-10-CM

## 2013-05-12 LAB — CBC
HEMATOCRIT: 29.6 % — AB (ref 39.0–52.0)
Hemoglobin: 10.5 g/dL — ABNORMAL LOW (ref 13.0–17.0)
MCH: 33 pg (ref 26.0–34.0)
MCHC: 35.5 g/dL (ref 30.0–36.0)
MCV: 93.1 fL (ref 78.0–100.0)
Platelets: 133 10*3/uL — ABNORMAL LOW (ref 150–400)
RBC: 3.18 MIL/uL — ABNORMAL LOW (ref 4.22–5.81)
RDW: 12 % (ref 11.5–15.5)
WBC: 9.4 10*3/uL (ref 4.0–10.5)

## 2013-05-12 LAB — GLUCOSE, CAPILLARY
Glucose-Capillary: 185 mg/dL — ABNORMAL HIGH (ref 70–99)
Glucose-Capillary: 292 mg/dL — ABNORMAL HIGH (ref 70–99)
Glucose-Capillary: 183 mg/dL — ABNORMAL HIGH (ref 70–99)
Glucose-Capillary: 282 mg/dL — ABNORMAL HIGH (ref 70–99)

## 2013-05-12 LAB — HEPARIN LEVEL (UNFRACTIONATED): Heparin Unfractionated: 0.5 IU/mL (ref 0.30–0.70)

## 2013-05-12 MED ORDER — HEPARIN SODIUM (PORCINE) 5000 UNIT/ML IJ SOLN
5000.0000 [IU] | Freq: Three times a day (TID) | INTRAMUSCULAR | Status: DC
  Administered 2013-05-12: 5000 [IU] via SUBCUTANEOUS
  Filled 2013-05-12 (×3): qty 1

## 2013-05-12 MED ORDER — CLOPIDOGREL BISULFATE 300 MG PO TABS
300.0000 mg | ORAL_TABLET | Freq: Once | ORAL | Status: AC
  Administered 2013-05-12: 300 mg via ORAL
  Filled 2013-05-12 (×2): qty 1

## 2013-05-12 MED ORDER — AZITHROMYCIN 500 MG PO TABS: 500.0000 mg | ORAL_TABLET | Freq: Every day | ORAL | Status: DC

## 2013-05-12 MED ORDER — OSELTAMIVIR PHOSPHATE 75 MG PO CAPS
75.0000 mg | ORAL_CAPSULE | Freq: Every day | ORAL | Status: DC
  Administered 2013-05-12 – 2013-05-13 (×2): 75 mg via ORAL
  Filled 2013-05-12 (×3): qty 1

## 2013-05-12 MED ORDER — IPRATROPIUM-ALBUTEROL 0.5-2.5 (3) MG/3ML IN SOLN
3.0000 mL | Freq: Four times a day (QID) | RESPIRATORY_TRACT | Status: DC
Start: 2013-05-12 — End: 2013-05-13
  Administered 2013-05-12 – 2013-05-13 (×6): 3 mL via RESPIRATORY_TRACT
  Filled 2013-05-12 (×7): qty 3

## 2013-05-12 MED ORDER — ALBUTEROL SULFATE (2.5 MG/3ML) 0.083% IN NEBU: 2.5000 mg | INHALATION_SOLUTION | RESPIRATORY_TRACT | Status: DC | PRN

## 2013-05-12 MED ORDER — DILTIAZEM HCL ER COATED BEADS 240 MG PO CP24
240.0000 mg | ORAL_CAPSULE | Freq: Every day | ORAL | Status: DC
  Administered 2013-05-12: 240 mg via ORAL
  Filled 2013-05-12 (×2): qty 1

## 2013-05-12 MED ORDER — IPRATROPIUM BROMIDE 0.02 % IN SOLN: 0.5000 mg | Freq: Four times a day (QID) | RESPIRATORY_TRACT | Status: DC

## 2013-05-12 MED ORDER — CLOPIDOGREL BISULFATE 75 MG PO TABS
75.0000 mg | ORAL_TABLET | Freq: Every day | ORAL | Status: DC
  Administered 2013-05-13: 75 mg via ORAL
  Filled 2013-05-12: qty 1

## 2013-05-12 MED ORDER — ENOXAPARIN SODIUM 30 MG/0.3ML ~~LOC~~ SOLN
30.0000 mg | SUBCUTANEOUS | Status: DC
  Administered 2013-05-12 – 2013-05-15 (×4): 30 mg via SUBCUTANEOUS
  Filled 2013-05-12 (×5): qty 0.3

## 2013-05-12 MED ORDER — ALBUTEROL SULFATE (2.5 MG/3ML) 0.083% IN NEBU: 2.5000 mg | INHALATION_SOLUTION | Freq: Four times a day (QID) | RESPIRATORY_TRACT | Status: DC

## 2013-05-12 NOTE — Progress Notes (Signed)
Subjective: Pt denies chest pain, sob.  He is not really active at home.    RN: yesterday pt had tachycardia and elevated BP.  NTG gtt off, Heparin gtt going.  No [place] 5L   Objective: Vital signs in last 24 hours: Filed Vitals:   05/12/13 0600 05/12/13 0700 05/12/13 0800 05/12/13 0900  BP: 118/58 121/53 128/43 112/55  Pulse:      Temp:   97.8 F (36.6 C)   TempSrc:   Oral   Resp: 21 21 22 25   Height:      Weight:      SpO2: 100% 98% 99% 93%   Weight change: -1 lb 12.7 oz (-0.815 kg)  Intake/Output Summary (Last 24 hours) at 05/12/13 1003 Last data filed at 05/12/13 1000  Gross per 24 hour  Intake   1813 ml  Output   2575 ml  Net   -762 ml   Vitals reviewed. General: resting in bed, NAD HEENT: [place]/at no scleral icterus Cardiac: RRR, no rubs, murmurs or gallops Pulm: exp wheezes anteriorly b/l; on 5L  Abd: soft, nontender, nondistended, BS present Ext: warm and well perfused, no pedal edema Neuro: alert and oriented X3, cranial nerves II-XII grossly intact, moving all 4 extremities   Lab Results: Basic Metabolic Panel:  Recent Labs Lab 05/11/13 0435 05/11/13 1418  NA 143 139  K 5.1 4.0  CL 109 103  CO2 19 18*  GLUCOSE 291* 226*  BUN 36* 36*  CREATININE 1.76* 1.68*  CALCIUM 8.3* 8.8   CBC:  Recent Labs Lab 05/09/13 0739  05/11/13 0435 05/12/13 0240  WBC 7.0  < > 12.0* 9.4  NEUTROABS 5.6  --   --   --   HGB 10.1*  < > 10.9* 10.5*  HCT 29.2*  < > 30.3* 29.6*  MCV 95.1  < > 94.1 93.1  PLT 97*  < > 148* 133*  < > = values in this interval not displayed. Cardiac Enzymes:  Recent Labs Lab 05/11/13 0435 05/11/13 0835 05/11/13 1418  TROPONINI 1.43* 1.47* 1.33*   BNP:  Recent Labs Lab 05/10/13 1855  PROBNP 18246.0*   CBG:  Recent Labs Lab 05/10/13 2109 05/11/13 0807 05/11/13 1218 05/11/13 1622 05/11/13 2127 05/12/13 0822  GLUCAP 226* 251* 317* 205* 198* 185*   Coagulation:  Recent Labs Lab 05/09/13 1055  LABPROT 13.8  INR  1.08   Urinalysis:  Recent Labs Lab 05/09/13 1012  COLORURINE YELLOW  LABSPEC 1.021  PHURINE 5.0  GLUCOSEU NEGATIVE  HGBUR NEGATIVE  BILIRUBINUR NEGATIVE  KETONESUR NEGATIVE  PROTEINUR 30*  UROBILINOGEN 0.2  NITRITE NEGATIVE  LEUKOCYTESUR NEGATIVE   Misc. Labs: None   Micro Results: Recent Results (from the past 240 hour(s))  MRSA PCR SCREENING     Status: None   Collection Time    05/09/13  5:17 PM      Result Value Ref Range Status   MRSA by PCR NEGATIVE  NEGATIVE Final   Comment:            The GeneXpert MRSA Assay (FDA     approved for NASAL specimens     only), is one component of a     comprehensive MRSA colonization     surveillance program. It is not     intended to diagnose MRSA     infection nor to guide or     monitor treatment for     MRSA infections.   Studies/Results: Dg Chest Port 1 View  05/12/2013  CLINICAL DATA:  Cough and shortness of breath.  EXAM: PORTABLE CHEST - 1 VIEW  COMPARISON:  Single view of the chest 05/11/2013.  FINDINGS: Right much worse than left airspace seen on the most recent plain film has markedly improved. Residual airspace opacity is most notable in the right upper lobe. There is no pneumothorax or pleural effusion. Heart size is normal. Aortic stent graft is noted.  IMPRESSION: Marked improvement in bilateral airspace disease. No new abnormality.   Electronically Signed   By: Inge Rise M.D.   On: 05/12/2013 07:42   Dg Chest Port 1 View  05/11/2013   CLINICAL DATA:  Shortness of breath  EXAM: PORTABLE CHEST - 1 VIEW  COMPARISON:  May 09, 2013  FINDINGS: The heart size and mediastinal contours are stable. Stable postsurgical changes with aortic stent graft noted. There is interval developed diffuse airspace opacity of the entire right lung and the left lung base. The visualized skeletal structures are stable.  IMPRESSION: Interval developed diffuse airspace opacity of the entire right lung and the left lung base.  Differential diagnosis includes multi lobar pneumonia or less likely asymmetric pulmonary edema.   Electronically Signed   By: Abelardo Diesel M.D.   On: 05/11/2013 02:39   Medications:  Scheduled Meds: . aspirin EC  81 mg Oral q morning - 10a  . atorvastatin  20 mg Oral q morning - 10a  . [START ON 05/13/2013] azithromycin  500 mg Oral Daily  . carvedilol  12.5 mg Oral BID WC  . cefTRIAXone (ROCEPHIN)  IV  1 g Intravenous Q24H  . cholecalciferol  2,000 Units Oral Daily  . clopidogrel  300 mg Oral Once  . [START ON 05/13/2013] clopidogrel  75 mg Oral Q breakfast  . diltiazem  240 mg Oral Daily  . docusate sodium  100 mg Oral BID  . ferrous sulfate  325 mg Oral Q breakfast  . fluticasone  2 spray Each Nare Daily  . furosemide  40 mg Intravenous Once  . furosemide  80 mg Intravenous Q12H  . heparin subcutaneous  5,000 Units Subcutaneous 3 times per day  . insulin aspart  0-9 Units Subcutaneous TID WC  . loratadine  10 mg Oral Daily  . metoprolol  5 mg Intravenous Once  . niacin  500 mg Oral Daily  . oseltamivir  30 mg Oral BID  . pantoprazole  40 mg Oral Daily   Continuous Infusions: . sodium chloride 10 mL/hr at 05/12/13 0700  . nitroGLYCERIN Stopped (05/12/13 0900)   PRN Meds:.hydrALAZINE, HYDROcodone-acetaminophen, metoprolol, morphine injection, ondansetron (ZOFRAN) IV, ondansetron, polyvinyl alcohol, temazepam Assessment/Plan: [name] is a 80 y.o. 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 His son has been also sick.  Admitted to hospitalist service and cardiology consulted d/t elevated troponin on admission.  He was transferred to [facility] ICU on 05/11/13 d/t respiratory distress.    #elevated  troponin with concern for NSTEMI (non-ST elevated myocardial infarction) type 2  -Patient denies chest pain. Likely demand ischemia d/t acute illness, AKI.  Trop trended down and stable  Ref. Range 05/09/2013 07:17 05/09/2013 10:55 05/09/2013 16:24 05/09/2013 22:30 05/10/2013 18:55 05/11/2013 04:35 05/11/2013 08:35 05/11/2013 14:18  Troponin I Latest Range: <0.30 ng/mL  2.97 (HH) 2.53 (HH) 1.55 (HH)  1.43 (HH) 1.47 (HH) 1.33 (HH)  Troponin i, poc Latest Range: 0.00-0.08 ng/mL 8.27 (HH)         Pro B Natriuretic peptide (BNP) Latest Range: 0-450 pg/mL     18246.0 (H)      -will medically tx -d/c heparin gtt started 05/10/13  -continue aspirin 81 mg, Plavix 300 mg x 1 then 75 mg daily, Lipitor 20 mg qd, Coreg 12.5 mg bid -will NOT do left heart cath  #Acute renal failure -Creatinine on admission 4.32.  Improving 1.68 on 2/11. Urine output adequate   -check BMET 2/13   #Acute respiratory failure with hypoxia -respiratory distress 2/11 improved 2/12  -2/12 CXR improved.  likely mutifactorial 2/2 flu and suspected pneumonia on CXR, ?flash pulmonary edema vs fluid overload 2/10  -bipap prn  -CCM following  #? Pneumonia likely CAP -Rocephin, AZM iv  -tx per primary   #Influenza A with respiratory manifestations -Tamiflu 30 mg bid  -droplet   #Hypertension -Coreg 12.5 mg bid, Cardizem 60 qid changed to 240 CD daily, prn Hydralazine 10-40 mg q4 prn, prn Metoprolol 2.5-5 mg q3 hours prn  -NTG gtt off. Initially on d/t elevated BP 05/11/13  -Monitor BP   # Diabetes mellitus, type 2 -monitor cbgs still hyperglycemic, SSI-S -rec add basal 5-10 units daily   #S/p fall  -PT/OT rec H/H services   #F/E/N -NSL -monitor electrolytes  -cardiac diet   #DVT px  -Heparin sq   Code status DNR    LOS: 3 days   Cresenciano Genre, MD [phone] 05/12/2013, 10:03 AM  Patient seen, examined. Available data reviewed. Agree with findings, assessment, and plan as outlined by Dr Aundra Dubin. Exam reveals alert,  oriented elderly male in NAD. Lungs with decreased air movement throughout. Heart distant without murmur or gallop. Extremities without edema. Troponins are trending down after initially peaking at 2.97. After review of his chart and symptoms leading to hospitalization, I strongly suspect this was a Type 2 NSTEMI in the setting of respiratory failure, influenza. Segmental wall motion abnormalities noted on echo:  Study Conclusions  - Left ventricle: Septal and inferior wall hypokinesis The cavity size was mildly dilated. Wall thickness was normal. Systolic function was mildly reduced. The estimated ejection fraction was in the range of 45% to 50%. - Mitral valve: Mild regurgitation. - Left atrium: The atrium was mildly dilated. - Atrial septum: No defect or patent foramen ovale was identified. - Impressions: No vegetations appreciated Image quality suboptimal Impressions:  - No vegetations appreciated Image quality suboptimal No cardiac source of emboli was indentified.  Pt was made DNR yesterday when he expressed that he wanted no further aggressive interventions done. I have reviewed his vascular history which is very extensive. He has penetrating aortic ulcerations, previous arch repair, and lower extremity revascularization surgery. Considering his multiple comorbid conditions which include acute renal failure now improving, vascular disease, and his wishes for conservative, I would NOT recommend proceeding with invasive cardiac eval as there is more harm than good that will come from this. Will start plavix 300 mg today then 75 mg daily and continue other medical therapy for his NSTEMI as outlined above.   Sherren Mocha, M.D. 05/12/2013 10:27 AM

## 2013-05-12 NOTE — Progress Notes (Addendum)
Name: [name] MRN: [id number] DOB: 11-23-1930    ADMISSION DATE:  05/09/2013 CONSULTATION DATE:  2/11  REFERRING MD :  Dema Severin  PRIMARY SERVICE: [facility]-->PCCM   CHIEF COMPLAINT:  Acute resp failure   BRIEF PATIENT DESCRIPTION:  80 year old male admitted on 2/9 w/ influenza A +/- CAP, dehydration, acute renal failure, and NSTEMI. Treated w/ ABX, tamiflu  and medical conservative therapy re: NSTEMI. PCCM called emergently to bedside early in am hours 2/11 for hypertensive crisis, tachycardia, and acute resp failure.   SIGNIFICANT EVENTS / STUDIES:  2/9 Admitted w/ working dx of influenza A +/- CAP c/b NSTEMI.  2/10 ECHO: EF 40-45%, inf wall hypokinesis, LA mildly dilated   2/11 Transfer to ICU with acute resp distress, marked hypertension and pulmonary edema 2/11 Per pt's request, made DNR 2/12 Markedly improved. Trf to SDU  CULTURES: mrsa PCR 2/9: neg Influenza A 2/9: pos   ANTIBIOTICS: azithro 2/9>> 2/12 Rocephin 2/9>> 2/12 Oseltamivir 2/9 >>    SUBJECTIVE:  No new complaints. No distress   VITAL SIGNS: Temp:  [97.8 F (36.6 C)-98.7 F (37.1 C)] 97.8 F (36.6 C) (02/12 1200) Pulse Rate:  [84-115] 84 (02/12 1249) Resp:  [18-28] 23 (02/12 1200) BP: (73-168)/(34-103) 106/40 mmHg (02/12 1200) SpO2:  [93 %-100 %] 99 % (02/12 1443) Weight:  [70.4 kg (155 lb 3.3 oz)] 70.4 kg (155 lb 3.3 oz) (02/12 0500) HEMODYNAMICS:   VENTILATOR SETTINGS:   INTAKE / OUTPUT: Intake/Output     02/11 0701 - 02/12 0700 02/12 0701 - 02/13 0700   P.O. 1000    I.V. (mL/kg) 747.6 (10.6) 95.5 (1.4)   IV Piggyback 900 300   Total Intake(mL/kg) 2647.6 (37.6) 395.5 (5.6)   Urine (mL/kg/hr) 2775 (1.6) 200 (0.3)   Total Output 2775 200   Net -127.4 +195.5        Stool Occurrence 1 x      PHYSICAL EXAMINATION: General: NAD Neuro: intact HEENT: WNL  Cardiovascular: RRR s M Lungs: mild bibasilar crackles Abdomen: soft, NABS Ext: warm, no edema  LABS: I have reviewed  all of today's lab results. Relevant abnormalities are discussed in the A/P section  CXR: marked worsening of R>L bilateral airspace disease. Asymmetric edema superimposed on PNA    ASSESSMENT / PLAN:  PULMONARY A: Acute respiratory failure, much improved Resolving pulmonary edema superimposed Influenza A positive nasal swab - unclear how much this is contributing to resp failure Doubt bacterial PNA P:   Cont supplemental O2 Transfer to SDU   CARDIOVASCULAR A:  NSTEMI Hypertensive crisis - controlled Acute decompensated systolic heart failure - improved  Tachycardia, resolved  P:  Discussed with Dr Burt Knack Agree with conservative mgmt - med rx and no LHC  RENAL A:   Acute renal failure, improving Metabolic acidosis, mild P:   Monitor BMET intermittently Monitor I/Os Correct electrolytes as indicated  GASTROINTESTINAL A:  No acute  P:   SUP:  N/I CHO mod diet  HEMATOLOGIC A:  Mild anemia  P:  DVT px: enoxaparin Monitor CBC intermittently  INFECTIOUS A:   Influenza A Doubt bacterial PNA P:   Cont oselltamivir to complete 5 days of therapy DC antibacterials  ENDOCRINE A:  DM P:   Cont ssi   NEUROLOGIC A:  Anxiety, resolved P:   Supportive care     Will ask TRH to resume primary duties  Merton Border, MD ; [facility] service Mobile [phone].  After 5:30 PM or weekends, call [phone]

## 2013-05-12 NOTE — Progress Notes (Signed)
Physical Therapy Treatment Patient Details Name: [name] MRN: [id number] DOB: 01-21-1931 Today's Date: 05/12/2013 Time: 1222-1251 PT Time Calculation (min): 29 min  PT Assessment / Plan / Recommendation  History of Present Illness 80 year old male patient with past medical history of hypertension diabetes and stroke. Presented to the emergency department after experiencing a fall. Patient was trying to dress himself he apparently fell onto the floor. Family noted patient with decreased mobility for several days. In addition he has had productive cough and dyspnea for 6 days.Pt flu (+), NSTEMI   PT Comments   Pt progressing well and able to ambulate in hall today with 5L maintaining sats 100% and HR 84. Pt without pain and encouraged to continue OOB and HEP with staff. Will follow.   Follow Up Recommendations  Home health PT;Supervision for mobility/OOB     Does the patient have the potential to tolerate intense rehabilitation     Barriers to Discharge        Equipment Recommendations       Recommendations for Other Services    Frequency     Progress towards PT Goals Progress towards PT goals: Progressing toward goals  Plan Current plan remains appropriate    Precautions / Restrictions Precautions Precautions: Fall   Pertinent Vitals/Pain No pain   Mobility  Bed Mobility Overal bed mobility: Modified Independent General bed mobility comments: increased time with rail and HOB elevated Transfers Overall transfer level: Needs assistance Sit to Stand: Min guard General transfer comment: cueing for safety and assist for management of lines Ambulation/Gait Ambulation/Gait assistance: Min guard Ambulation Distance (Feet): 120 Feet Assistive device: Rolling walker (2 wheeled) Gait Pattern/deviations: Step-through pattern;Decreased stride length;Trunk flexed Gait velocity interpretation: <1.8 ft/sec, indicative of risk for recurrent falls General Gait Details: cueing to  step into RW and extend trunk pt walks with excessive flexion and unable to keep self inside RW    Exercises General Exercises - Lower Extremity Long Arc Quad: AROM;Both;Seated;20 reps Hip Flexion/Marching: AROM;Both;Seated;20 reps   PT Diagnosis:    PT Problem List:   PT Treatment Interventions:     PT Goals (current goals can now be found in the care plan section)    Visit Information  Last PT Received On: 05/12/13 Assistance Needed: +1 History of Present Illness: 80 year old male patient with past medical history of hypertension diabetes and stroke. Presented to the emergency department after experiencing a fall. Patient was trying to dress himself he apparently fell onto the floor. Family noted patient with decreased mobility for several days. In addition he has had productive cough and dyspnea for 6 days.Pt flu (+), NSTEMI    Subjective Data      Cognition  Cognition Arousal/Alertness: Awake/alert Behavior During Therapy: WFL for tasks assessed/performed Overall Cognitive Status: Within Functional Limits for tasks assessed    Balance     End of Session PT - End of Session Equipment Utilized During Treatment: Gait belt;Oxygen Activity Tolerance: Patient tolerated treatment well Patient left: in chair;with call bell/phone within reach Nurse Communication: Mobility status   GP     Lanetta Inch [facility] 05/12/2013, 12:51 PM [name], [place]

## 2013-05-12 NOTE — Progress Notes (Signed)
Results for ASHKAN, CHAMBERLAND (MRN [id number]) as of 05/12/2013 13:39  Ref. Range 05/11/2013 12:18 05/11/2013 16:22 05/11/2013 21:27 05/12/2013 08:22 05/12/2013 12:19  Glucose-Capillary Latest Range: 70-99 mg/dL 317 (H) 205 (H) 198 (H) 185 (H) 292 (H)   Recommend adding low dose basal insulin Lantus 10-15 units daily if CBGs continue greater than 180 mgdl.  Will continue to follow while in hospital. Harvel Ricks RN BSN CDE

## 2013-05-13 DIAGNOSIS — R918 Other nonspecific abnormal finding of lung field: Secondary | ICD-10-CM

## 2013-05-13 LAB — GLUCOSE, CAPILLARY
GLUCOSE-CAPILLARY: 194 mg/dL — AB (ref 70–99)
GLUCOSE-CAPILLARY: 252 mg/dL — AB (ref 70–99)
Glucose-Capillary: 315 mg/dL — ABNORMAL HIGH (ref 70–99)
Glucose-Capillary: 273 mg/dL — ABNORMAL HIGH (ref 70–99)

## 2013-05-13 LAB — BASIC METABOLIC PANEL
BUN: 51 mg/dL — ABNORMAL HIGH (ref 6–23)
CHLORIDE: 97 meq/L (ref 96–112)
CO2: 21 mEq/L (ref 19–32)
CREATININE: 1.95 mg/dL — AB (ref 0.50–1.35)
Calcium: 9 mg/dL (ref 8.4–10.5)
GFR calc Af Amer: 36 mL/min — ABNORMAL LOW (ref >90)
GFR calc non Af Amer: 31 mL/min — ABNORMAL LOW (ref >90)
GLUCOSE: 208 mg/dL — AB (ref 70–99)
POTASSIUM: 3.3 meq/L — AB (ref 3.7–5.3)
Sodium: 137 mEq/L (ref 137–147)

## 2013-05-13 MED ORDER — CARVEDILOL 25 MG PO TABS
25.0000 mg | ORAL_TABLET | Freq: Two times a day (BID) | ORAL | Status: DC
  Administered 2013-05-14 – 2013-05-16 (×5): 25 mg via ORAL
  Filled 2013-05-13 (×8): qty 1

## 2013-05-13 MED ORDER — CARVEDILOL 25 MG PO TABS
25.0000 mg | ORAL_TABLET | Freq: Two times a day (BID) | ORAL | Status: DC
  Filled 2013-05-13 (×2): qty 1

## 2013-05-13 MED ORDER — IPRATROPIUM-ALBUTEROL 0.5-2.5 (3) MG/3ML IN SOLN
3.0000 mL | Freq: Three times a day (TID) | RESPIRATORY_TRACT | Status: DC
  Administered 2013-05-14 (×2): 3 mL via RESPIRATORY_TRACT
  Filled 2013-05-13 (×2): qty 3

## 2013-05-13 MED ORDER — CARVEDILOL 12.5 MG PO TABS: 12.5000 mg | ORAL_TABLET | Freq: Two times a day (BID) | ORAL | Status: DC

## 2013-05-13 MED ORDER — CARVEDILOL 12.5 MG PO TABS
12.5000 mg | ORAL_TABLET | Freq: Two times a day (BID) | ORAL | Status: AC
  Administered 2013-05-13: 12.5 mg via ORAL
  Filled 2013-05-13: qty 1

## 2013-05-13 MED ORDER — POTASSIUM CHLORIDE CRYS ER 20 MEQ PO TBCR
40.0000 meq | EXTENDED_RELEASE_TABLET | Freq: Once | ORAL | Status: AC
  Administered 2013-05-13: 40 meq via ORAL
  Filled 2013-05-13: qty 2

## 2013-05-13 NOTE — Progress Notes (Signed)
Paged cardmaster for cardiology group in order to speak with Dr. Harrington Challenger to inform her that Cardizem po was not given by this nurse. Waiting response.Marland Kitchen

## 2013-05-13 NOTE — Progress Notes (Signed)
Paged [facility] for telemetry order..waiting response.

## 2013-05-13 NOTE — Progress Notes (Signed)
[name]  [name] NWG:[id number] DOB: 1930-04-10 DOA: 05/09/2013 PCP: [name], MD  Brief narrative: 80 year old male patient with past medical history of hypertension diabetes and stroke. Presented to the emergency department after experiencing a fall. Patient was trying to dress himself he apparently fell onto the floor. Family noted patient with decreased mobility for several days. In addition he has had productive cough and dyspnea for 6 days. No chest pain no fever. Endorses on also sick with unknown illness.  Emergency department his troponin was elevated at 8.0 and his creatinine was 4.3. Chest x-ray was concerning for chronic bronchitic changes and possibly an early evolving right upper lobe infiltrate. Despite a normal troponin EKG was unremarkable cardiology was consulted in the emergency department.  After admission developed HTN emergency with acute resp failure that required BiPAP and tx to ICU. Treated successfully without intubation. Pt requested to be made DNR. Cardiology had been consulted earlier due to NSTEMI.  Assessment/Plan: Active Problems:   NSTEMI (non-ST elevated myocardial infarction) -Per cardiology noting best approach conservative therapy-suspected demand ischemia in setting of acute illness and CHF -Continue aspirin, statin, beta blocker -Cards dc Plavix 2/13 -2-D echocardiogram with septal and inferior wall hypokinesis with mild reduced systolic function EF 57-84%. -No plans to pursue invasive evaluation coronary disease    Acute respiratory failure with hypoxia: A) Influenza A with respiratory manifestations B) Acute systolic heart failure -started on TAMIFLU after admit -since possibility of secondary bacterial PNA anbx's were also prescribed at [facility] felt no PNA so anbx's dc'd 2/12 -Cards managing diuresis-BUN/cr up so holding Lasix -changing CCB to Coreg 2/14    Hypertension  -BP  controlled -had issues with HTN crisis after hydrated-suspect flash edema in setting of uncontrolled HTN    Diabetes mellitus, type 2 -CBGs improving and now less than 200 -Continue SSI -Was on Amaryl prior to admission    Acute renal failure  - had significant decrease in BUN and creatinine as compared to admission with creatinine climbing to 2.39 therefore IVFs cont'd -now BUN once again climbing after aggressive diuresis so Lasix on hold - Noted back in 2003 his creatinine was 1.2 but no recent for comparison -Follow electrolytes    Dehydration -resolved   Deconditioning -tachycardic with activity-? deconditioned -PT/OT and mobilize    Occlusion and stenosis of carotid artery without mention of cerebral infarction    High cholesterol    PAD (peripheral artery disease)   DVT prophylaxis: IV heparin infusion Code Status: Full Family Communication: Spoke with son at bedside Disposition Plan/Expected LOS: Transfer to floor   Consultants: Cardiology  Procedures: 2-D echocardiogram  - Left ventricle: Septal and inferior wall hypokinesis The cavity size was mildly dilated. Wall thickness was normal. Systolic function was mildly reduced. The estimated ejection fraction was in the range of 45% to 50%. - Mitral valve: Mild regurgitation. - Left atrium: The atrium was mildly dilated. - Atrial septum: No defect or patent foramen ovale was identified. - Impressions: No vegetations appreciated Image quality suboptimal Impressions:  - No vegetations appreciated Image quality suboptimal No cardiac source of emboli was indentified.   Antibiotics: Rocephin 2/9 >>> 2/12 Zithromax 2/9 >>> 2/12 Tamiflu 2/9 >>>  HPI/Subjective: Alert and without complaints. No CP or SOB. Able to reposition self to upright wile in the bed  Objective: Blood pressure 145/83, pulse 116, temperature 98.2 F (36.8 C), temperature source Oral, resp. rate 19, height 5\' 5"  (1.651 m), weight  150  lb 12.7 oz (68.4 kg), SpO2 97.00%.  Intake/Output Summary (Last 24 hours) at 05/13/13 1211 Last data filed at 05/13/13 1158  Gross per 24 hour  Intake      0 ml  Output   2075 ml  Net  -2075 ml     Exam: General: No acute respiratory distress Lungs: Crackles bilateral bases but no tachypnea, 3 L Cardiovascular: Regular tachycardic rate and rhythm without murmur gallop or rub normal S1 and S2, no peripheral edema or JVD Abdomen: Nontender, nondistended, soft, bowel sounds positive, no rebound, no ascites, no appreciable mass Musculoskeletal: No significant cyanosis, clubbing of bilateral lower extremities Neurological: Awake, moves all extremities x 4 without focal neurological deficits, CN 2-12 intact  Scheduled Meds:  Scheduled Meds: . aspirin EC  81 mg Oral q morning - 10a  . atorvastatin  20 mg Oral q morning - 10a  . carvedilol  12.5 mg Oral BID WC  . [START ON 05/14/2013] carvedilol  25 mg Oral BID WC  . cholecalciferol  2,000 Units Oral Daily  . docusate sodium  100 mg Oral BID  . enoxaparin (LOVENOX) injection  30 mg Subcutaneous Q24H  . ferrous sulfate  325 mg Oral Q breakfast  . fluticasone  2 spray Each Nare Daily  . furosemide  40 mg Intravenous Once  . insulin aspart  0-9 Units Subcutaneous TID WC  . ipratropium-albuterol  3 mL Nebulization Q6H  . loratadine  10 mg Oral Daily  . metoprolol  5 mg Intravenous Once  . niacin  500 mg Oral Daily  . oseltamivir  75 mg Oral Daily   Continuous Infusions: . sodium chloride 10 mL/hr at 05/12/13 0700  . nitroGLYCERIN Stopped (05/12/13 0900)    Data Reviewed: Basic Metabolic Panel:  Recent Labs Lab 05/09/13 0705 05/10/13 0258 05/11/13 0435 05/11/13 1418 05/13/13 0510  NA 135* 142 143 139 137  K 4.7 4.5 5.1 4.0 3.3*  CL 100 109 109 103 97  CO2 15* 17* 19 18* 21  GLUCOSE 147* 166* 291* 226* 208*  BUN 78* 55* 36* 36* 51*  CREATININE 4.32* 2.39* 1.76* 1.68* 1.95*  CALCIUM 8.4 8.4 8.3* 8.8 9.0   Liver Function  Tests: No results found for this basename: AST, ALT, ALKPHOS, BILITOT, PROT, ALBUMIN,  in the last 168 hours No results found for this basename: LIPASE, AMYLASE,  in the last 168 hours No results found for this basename: AMMONIA,  in the last 168 hours CBC:  Recent Labs Lab 05/09/13 0739 05/10/13 0258 05/11/13 0435 05/12/13 0240  WBC 7.0 7.0 12.0* 9.4  NEUTROABS 5.6  --   --   --   HGB 10.1* 10.0* 10.9* 10.5*  HCT 29.2* 28.6* 30.3* 29.6*  MCV 95.1 94.1 94.1 93.1  PLT 97* 103* 148* 133*   Cardiac Enzymes:  Recent Labs Lab 05/09/13 1624 05/09/13 2230 05/11/13 0435 05/11/13 0835 05/11/13 1418  TROPONINI 2.53* 1.55* 1.43* 1.47* 1.33*   BNP (last 3 results)  Recent Labs  05/10/13 1855  PROBNP 18246.0*   CBG:  Recent Labs Lab 05/12/13 0822 05/12/13 1219 05/12/13 1759 05/12/13 2146 05/13/13 0804  GLUCAP 185* 292* 183* 282* 194*    Recent Results (from the past 240 hour(s))  MRSA PCR SCREENING     Status: None   Collection Time    05/09/13  5:17 PM      Result Value Ref Range Status   MRSA by PCR NEGATIVE  NEGATIVE Final   Comment:  The GeneXpert MRSA Assay (FDA     approved for NASAL specimens     only), is one component of a     comprehensive MRSA colonization     surveillance program. It is not     intended to diagnose MRSA     infection nor to guide or     monitor treatment for     MRSA infections.     Studies:  Recent x-ray studies have been reviewed in detail by the Attending Physician  Time spent :     Erin Hearing, [place] [facility] Office  [phone] Pager [phone]  **If unable to reach the above provider after paging please contact the [place] @ [phone]  On-Call/Text Page:      [email or web address]      password [email or web address]  If 7PM-7AM, please contact night-coverage [email or web address] Password [facility] 05/13/2013, 12:11 PM   LOS: 4 days   I have examined the patient, reviewed the chart and modified the above [name] which I  agree with.   Imberly Troxler,MD [phone] 05/13/2013, 3:58 PM

## 2013-05-13 NOTE — Progress Notes (Addendum)
Subjective: No dizziness.  Gets tired with moving around   Objective: Filed Vitals:   05/13/13 0300 05/13/13 0500 05/13/13 0827 05/13/13 0919  BP: 145/62  144/74   Pulse: 102  106   Temp: 98.2 F (36.8 C)  98.2 F (36.8 C)   TempSrc: Oral  Oral   Resp: 23  23   Height:      Weight:  150 lb 12.7 oz (68.4 kg)    SpO2: 94%  92% 97%   Weight change: -4 lb 6.6 oz (-2 kg)  Intake/Output Summary (Last 24 hours) at 05/13/13 0948 Last data filed at 05/13/13 9629  Gross per 24 hour  Intake  286.5 ml  Output   1550 ml  Net -1263.5 ml    General: Alert, awake, oriented x3, in no acute distress Neck:  JVP is normal Heart: Regular rate and rhythm, without murmurs, rubs, gallops.  Lungs: Clear to auscultation.  No rales or wheezes. Exemities:  No edema.   Neuro: Grossly intact, nonfocal.  TEle:  SR/ST  Lab Results: Results for orders placed during the hospital encounter of 05/09/13 (from the past 24 hour(s))  GLUCOSE, CAPILLARY     Status: Abnormal   Collection Time    05/12/13 12:19 PM      Result Value Ref Range   Glucose-Capillary 292 (*) 70 - 99 mg/dL  GLUCOSE, CAPILLARY     Status: Abnormal   Collection Time    05/12/13  5:59 PM      Result Value Ref Range   Glucose-Capillary 183 (*) 70 - 99 mg/dL  GLUCOSE, CAPILLARY     Status: Abnormal   Collection Time    05/12/13  9:46 PM      Result Value Ref Range   Glucose-Capillary 282 (*) 70 - 99 mg/dL  BASIC METABOLIC PANEL     Status: Abnormal   Collection Time    05/13/13  5:10 AM      Result Value Ref Range   Sodium 137  137 - 147 mEq/L   Potassium 3.3 (*) 3.7 - 5.3 mEq/L   Chloride 97  96 - 112 mEq/L   CO2 21  19 - 32 mEq/L   Glucose, Bld 208 (*) 70 - 99 mg/dL   BUN 51 (*) 6 - 23 mg/dL   Creatinine, Ser 1.95 (*) 0.50 - 1.35 mg/dL   Calcium 9.0  8.4 - 10.5 mg/dL   GFR calc non Af Amer 31 (*) >90 mL/min   GFR calc Af Amer 36 (*) >90 mL/min  GLUCOSE, CAPILLARY     Status: Abnormal   Collection Time    05/13/13   8:04 AM      Result Value Ref Range   Glucose-Capillary 194 (*) 70 - 99 mg/dL    Studies/Results: @RISRSLT24 @  Medications: Reviewed   @PROBHOSP @  1  NSTEMI  Probable demand ischemia in setting of acute illness/CHF  Plan for conservative Rx (B BLocker, statin, ASA)   WIll d/c plavix   2.  Acute on chronic systolic CHF  LVEF 45 to 52%   Patinet just got 240 dilt  Will d/c tomorrow  INcrease Coreg to 25 bid and follow BP and HR. Patinet just got AM IV lasix  WIll hold further as BUN CR bumped.  Follow labs and exam Patient quite tachycardic with standing, moving around  Will need to follow  May be prerenal +deconditioned.   WOuld check BNP  3  Pulm:  Acute failure  Related to CHF and  influenza  4.  HTN  Follow  5.  DM  6.  ID  On oseltamvir for total of 5 days.    7  Renal  Follow in AM  LOS: 4 days   Dorris Carnes 05/13/2013, 9:48 AM

## 2013-05-13 NOTE — Progress Notes (Signed)
Physical Therapy Treatment Patient Details Name: [name] MRN: [id number] DOB: 09-02-1930 Today's Date: 05/13/2013 Time: 1062-6948 PT Time Calculation (min): 29 min  PT Assessment / Plan / Recommendation  History of Present Illness 80 year old male patient with past medical history of hypertension diabetes and stroke. Presented to the emergency department after experiencing a fall. Patient was trying to dress himself he apparently fell onto the floor. Family noted patient with decreased mobility for several days. In addition he has had productive cough and dyspnea for 6 days.Pt flu (+), NSTEMI   PT Comments   Pt progressing daily with mobility and increased ambulation today. Pt with hypokalemia and Dr. Wynelle Cleveland approved mobility prior to session. Pt encouraged to continue mobility over weekend with staff and HEP. Will continue to follow. Son present throughout session and agreeable that pt mobility conducive to return home.   Follow Up Recommendations  Home health PT;Supervision for mobility/OOB     Does the patient have the potential to tolerate intense rehabilitation     Barriers to Discharge        Equipment Recommendations       Recommendations for Other Services    Frequency     Progress towards PT Goals Progress towards PT goals: Progressing toward goals  Plan Current plan remains appropriate    Precautions / Restrictions Precautions Precautions: Fall   Pertinent Vitals/Pain No pain HR 106-130 with gait, with BM 141 104/56 sats 97% on 3L    Mobility  Bed Mobility Overal bed mobility: Modified Independent General bed mobility comments: with rail Transfers Overall transfer level: Needs assistance Sit to Stand: Supervision General transfer comment: cueing for safety, hand placement and assist for management of lines Ambulation/Gait Ambulation/Gait assistance: Min guard Ambulation Distance (Feet): 210 Feet Assistive device: Rolling walker (2 wheeled) Gait  Pattern/deviations: Step-through pattern;Decreased stride length;Trunk flexed Gait velocity interpretation: <1.8 ft/sec, indicative of risk for recurrent falls General Gait Details: cueing to step into RW and extend trunk pt walks with excessive flexion and unable to keep self inside RW    Exercises General Exercises - Lower Extremity Ankle Circles/Pumps: AROM;Both;15 reps;Seated Hip ABduction/ADduction: AROM;Seated;Both;15 reps   PT Diagnosis:    PT Problem List:   PT Treatment Interventions:     PT Goals (current goals can now be found in the care plan section)    Visit Information  Last PT Received On: 05/13/13 Assistance Needed: +1 History of Present Illness: 80 year old male patient with past medical history of hypertension diabetes and stroke. Presented to the emergency department after experiencing a fall. Patient was trying to dress himself he apparently fell onto the floor. Family noted patient with decreased mobility for several days. In addition he has had productive cough and dyspnea for 6 days.Pt flu (+), NSTEMI    Subjective Data      Cognition  Cognition Arousal/Alertness: Awake/alert Behavior During Therapy: WFL for tasks assessed/performed Overall Cognitive Status: Within Functional Limits for tasks assessed    Balance     End of Session PT - End of Session Equipment Utilized During Treatment: Gait belt;Oxygen Activity Tolerance: Patient tolerated treatment well Patient left: in chair;with call bell/phone within reach;with family/visitor present Nurse Communication: Mobility status   GP     [name] 05/13/2013, 10:18 AM [name], [place]

## 2013-05-13 NOTE — Evaluation (Addendum)
Occupational Therapy Evaluation Patient Details Name: [name] MRN: [id number] DOB: [birth year] Today's Date: 05/13/2013 Time: 4098-1191 OT Time Calculation (min): 27 min  OT Assessment / Plan / Recommendation History of present illness 80 year old male patient with past medical history of hypertension diabetes and stroke. Presented to the emergency department after experiencing a fall. Patient was trying to dress himself he apparently fell onto the floor. Family noted patient with decreased mobility for several days. In addition he has had productive cough and dyspnea for 6 days.Pt flu (+), NSTEMI   Clinical Impression   This 80 yo male admitted with above presents to acute OT with generalized weakness and DOE. Will benefit from continued OT with follow up [place].    OT Assessment  Patient needs continued OT Services    Follow Up Recommendations  Home health OT       Equipment Recommendations  None recommended by OT       Frequency  Min 2X/week    Precautions / Restrictions Precautions Precautions: Fall Restrictions Weight Bearing Restrictions: No   Pertinent Vitals/Pain 99% on RA at rest; 94% on RA post OT session. Left pt on 2 liters v. 3 liters that he was on when I entered room (pt not on O2 at home PTA) and made RN aware    ADL  Eating/Feeding: Independent Where Assessed - Eating/Feeding: Chair Grooming: Min guard Where Assessed - Grooming: Unsupported standing Upper Body Bathing: Set up Where Assessed - Upper Body Bathing: Unsupported sitting Lower Body Bathing: Min guard Where Assessed - Lower Body Bathing: Unsupported sit to stand Upper Body Dressing: Set up Where Assessed - Upper Body Dressing: Unsupported sitting Lower Body Dressing: Min guard Where Assessed - Lower Body Dressing: Unsupported sit to stand Toilet Transfer: Min Psychiatric nurse Method: Sit to Loss adjuster, chartered: Comfort height toilet;Grab bars Toileting - Marine scientist and Hygiene: Min guard Where Assessed - Best boy and Hygiene: Sit to stand from 3-in-1 or toilet Equipment Used: Rolling walker Transfers/Ambulation Related to ADLs: Min guard A with RW for all ADL Comments: Pt can cross legs to get to feet, more issue with RLE due to old hip replacement. Pt does have a sock aide at home if he needs to use it. I educated pt on purse lipped breathing    OT Diagnosis: Generalized weakness  OT Problem List: Decreased activity tolerance OT Treatment Interventions: Patient/family education;Balance training;Energy conservation   OT Goals(Current goals can be found in the care plan section) Acute Rehab OT Goals Patient Stated Goal: return home OT Goal Formulation: With patient Time For Goal Achievement: 05/20/13 Potential to Achieve Goals: Good ADL Goals Additional ADL Goal #1: Pt will be aware of energy conservation techniques that will be useful to him at home Additional ADL Goal #2: Pt will be able to go from sit>stand without a tendency to posterior lean or brace himself against a surface with his calfs  Visit Information  Last OT Received On: 05/13/13 Assistance Needed: +1 History of Present Illness: 80 year old male patient with past medical history of hypertension diabetes and stroke. Presented to the emergency department after experiencing a fall. Patient was trying to dress himself he apparently fell onto the floor. Family noted patient with decreased mobility for several days. In addition he has had productive cough and dyspnea for 6 days.Pt flu (+), NSTEMI       Prior Functioning     Home Living Family/patient expects to be discharged to:: Private residence Living Arrangements:  Children Available Help at Discharge: Available PRN/intermittently Type of Home: House Home Layout: One level Home Equipment: [place] [employer] - 2 wheels;Cane - single point;Shower seat;Grab bars - tub/shower;Hand held shower head Prior  Function Level of Independence: Independent with assistive device(s) Comments: pt does all his own ADLs including cooking and driving Communication Communication: HOH Dominant Hand: Right         Vision/Perception Vision - History Baseline Vision: Wears glasses all the time Patient Visual Report: No change from baseline   Cognition  Cognition Arousal/Alertness: Awake/alert Behavior During Therapy: WFL for tasks assessed/performed Overall Cognitive Status: Within Functional Limits for tasks assessed    Extremity/Trunk Assessment Upper Extremity Assessment Upper Extremity Assessment: Overall WFL for tasks assessed     Mobility Bed Mobility Overal bed mobility: Modified Independent Transfers Overall transfer level: Needs assistance Equipment used: Rolling walker (2 wheeled) Transfers: Sit to/from Stand Sit to Stand: Min guard           End of Session OT - End of Session Equipment Utilized During Treatment: Rolling walker Activity Tolerance: Patient tolerated treatment well Patient left: in bed;with call bell/phone within reach Nurse Communication:  (O2 readings and pt urinated and had bowel movement while I was working with him)       Almon Register [phone] 05/13/2013, 3:54 PM

## 2013-05-14 DIAGNOSIS — I739 Peripheral vascular disease, unspecified: Secondary | ICD-10-CM

## 2013-05-14 LAB — BASIC METABOLIC PANEL
BUN: 48 mg/dL — ABNORMAL HIGH (ref 6–23)
CO2: 25 mEq/L (ref 19–32)
Calcium: 9.3 mg/dL (ref 8.4–10.5)
Chloride: 97 mEq/L (ref 96–112)
Creatinine, Ser: 1.66 mg/dL — ABNORMAL HIGH (ref 0.50–1.35)
GFR calc non Af Amer: 37 mL/min — ABNORMAL LOW (ref >90)
GFR, EST AFRICAN AMERICAN: 43 mL/min — AB (ref >90)
Glucose, Bld: 211 mg/dL — ABNORMAL HIGH (ref 70–99)
POTASSIUM: 3.7 meq/L (ref 3.7–5.3)
SODIUM: 138 meq/L (ref 137–147)

## 2013-05-14 LAB — GLUCOSE, CAPILLARY
GLUCOSE-CAPILLARY: 202 mg/dL — AB (ref 70–99)
GLUCOSE-CAPILLARY: 191 mg/dL — AB (ref 70–99)
GLUCOSE-CAPILLARY: 248 mg/dL — AB (ref 70–99)
Glucose-Capillary: 301 mg/dL — ABNORMAL HIGH (ref 70–99)

## 2013-05-14 MED ORDER — PANTOPRAZOLE SODIUM 40 MG PO TBEC
40.0000 mg | DELAYED_RELEASE_TABLET | Freq: Every day | ORAL | Status: DC
  Administered 2013-05-14 – 2013-05-16 (×3): 40 mg via ORAL
  Filled 2013-05-14 (×3): qty 1

## 2013-05-14 MED ORDER — ISOSORBIDE DINITRATE 10 MG PO TABS
10.0000 mg | ORAL_TABLET | Freq: Three times a day (TID) | ORAL | Status: DC
  Administered 2013-05-14 – 2013-05-16 (×7): 10 mg via ORAL
  Filled 2013-05-14 (×9): qty 1

## 2013-05-14 MED ORDER — OSELTAMIVIR PHOSPHATE 30 MG PO CAPS
30.0000 mg | ORAL_CAPSULE | Freq: Two times a day (BID) | ORAL | Status: DC
  Administered 2013-05-14 – 2013-05-16 (×5): 30 mg via ORAL
  Filled 2013-05-14 (×6): qty 1

## 2013-05-14 NOTE — Progress Notes (Signed)
Progress Note  DRESHON PROFFIT GUR:[id number] DOB: November 21, 1930 DOA: 05/09/2013 PCP: [name], MD  Brief narrative: 80 year old male patient with past medical history of hypertension diabetes and stroke. Presented to the emergency department after experiencing a fall. Patient was trying to dress himself he apparently fell onto the floor. Family noted patient with decreased mobility for several days. In addition he has had productive cough and dyspnea for 6 days. No chest pain no fever. Endorses on also sick with unknown illness.  Emergency department his troponin was elevated at 8.0 and his creatinine was 4.3. Chest x-ray was concerning for chronic bronchitic changes and possibly an early evolving right upper lobe infiltrate. Despite a normal troponin EKG was unremarkable cardiology was consulted in the emergency department.  After admission developed HTN emergency with acute resp failure that required BiPAP and tx to ICU. Treated successfully without intubation. Pt requested to be made DNR. Cardiology had been consulted earlier due to NSTEMI.  Assessment/Plan: Active Problems:   NSTEMI (non-ST elevated myocardial infarction) -Per cardiology noting best approach conservative therapy-suspected demand ischemia in setting of acute illness and CHF -Continue aspirin, statin, beta blocker -Cards dc Plavix 2/13 -2-D echocardiogram with septal and inferior wall hypokinesis with mild reduced systolic function EF 28-31%. -No plans to pursue invasive evaluation coronary disease    Acute respiratory failure with hypoxia: A) Influenza A with respiratory manifestations B) Acute systolic heart failure -started on TAMIFLU after admit -since possibility of secondary bacterial PNA anbx's were also prescribed at [facility] felt no PNA so anbx's dc'd 2/12 -Cards managing diuresis-BUN/cr up so holding Lasix -changing CCB to Coreg 2/14    Hypertension  -BP controlled -had issues with HTN crisis after  hydrated-suspect flash edema in setting of uncontrolled HTN    Diabetes mellitus, type 2 -CBGs improving and now less than 200 -Continue SSI -Was on Amaryl prior to admission    Acute renal failure  - had significant decrease in BUN and creatinine as compared to admission with creatinine climbing to 2.39 therefore IVFs cont'd -now BUN once again climbing after aggressive diuresis so Lasix on hold - Noted back in 2003 his creatinine was 1.2 but no recent for comparison -Follow electrolytes    Dehydration -resolved   Deconditioning -tachycardic with activity-? deconditioned -PT/OT and mobilize    Occlusion and stenosis of carotid artery without mention of cerebral infarction    High cholesterol    PAD (peripheral artery disease)   DVT prophylaxis: IV heparin infusion Code Status: Full Family Communication: Spoke with son at bedside Disposition Plan/Expected LOS: Transfer to floor   Consultants: Cardiology  Procedures: 2-D echocardiogram  - Left ventricle: Septal and inferior wall hypokinesis The cavity size was mildly dilated. Wall thickness was normal. Systolic function was mildly reduced. The estimated ejection fraction was in the range of 45% to 50%. - Mitral valve: Mild regurgitation. - Left atrium: The atrium was mildly dilated. - Atrial septum: No defect or patent foramen ovale was identified. - Impressions: No vegetations appreciated Image quality suboptimal Impressions:  - No vegetations appreciated Image quality suboptimal No cardiac source of emboli was indentified.   Antibiotics: Rocephin 2/9 >>> 2/12 Zithromax 2/9 >>> 2/12 Tamiflu 2/9 >>>  HPI/Subjective: Alert and without complaints. No CP or SOB. Able to reposition self to upright wile in the bed  Objective: Blood pressure 116/58, pulse 98, temperature 97.7 F (36.5 C), temperature source Oral, resp. rate 18, height 5\' 5"  (1.651 m), weight 68.4 kg (150 lb 12.7 oz), SpO2  100.00%.  Intake/Output Summary (Last 24 hours) at 05/14/13 1527 Last data filed at 05/14/13 0737  Gross per 24 hour  Intake      0 ml  Output    875 ml  Net   -875 ml     Exam: General: No acute respiratory distress Lungs: Crackles bilateral bases but no tachypnea, 3 L Cardiovascular: Regular tachycardic rate and rhythm without murmur gallop or rub normal S1 and S2, no peripheral edema or JVD Abdomen: Nontender, nondistended, soft, bowel sounds positive, no rebound, no ascites, no appreciable mass Musculoskeletal: No significant cyanosis, clubbing of bilateral lower extremities Neurological: Awake, moves all extremities x 4 without focal neurological deficits, CN 2-12 intact  Scheduled Meds:  Scheduled Meds: . aspirin EC  81 mg Oral q morning - 10a  . atorvastatin  20 mg Oral q morning - 10a  . carvedilol  25 mg Oral BID WC  . cholecalciferol  2,000 Units Oral Daily  . docusate sodium  100 mg Oral BID  . enoxaparin (LOVENOX) injection  30 mg Subcutaneous Q24H  . ferrous sulfate  325 mg Oral Q breakfast  . fluticasone  2 spray Each Nare Daily  . furosemide  40 mg Intravenous Once  . insulin aspart  0-9 Units Subcutaneous TID WC  . ipratropium-albuterol  3 mL Nebulization TID  . isosorbide dinitrate  10 mg Oral TID  . loratadine  10 mg Oral Daily  . metoprolol  5 mg Intravenous Once  . niacin  500 mg Oral Daily  . oseltamivir  30 mg Oral BID   Continuous Infusions: . sodium chloride 10 mL/hr at 05/12/13 0700    Data Reviewed: Basic Metabolic Panel:  Recent Labs Lab 05/10/13 0258 05/11/13 0435 05/11/13 1418 05/13/13 0510 05/14/13 0414  NA 142 143 139 137 138  K 4.5 5.1 4.0 3.3* 3.7  CL 109 109 103 97 97  CO2 17* 19 18* 21 25  GLUCOSE 166* 291* 226* 208* 211*  BUN 55* 36* 36* 51* 48*  CREATININE 2.39* 1.76* 1.68* 1.95* 1.66*  CALCIUM 8.4 8.3* 8.8 9.0 9.3   Liver Function Tests: No results found for this basename: AST, ALT, ALKPHOS, BILITOT, PROT, ALBUMIN,   in the last 168 hours No results found for this basename: LIPASE, AMYLASE,  in the last 168 hours No results found for this basename: AMMONIA,  in the last 168 hours CBC:  Recent Labs Lab 05/09/13 0739 05/10/13 0258 05/11/13 0435 05/12/13 0240  WBC 7.0 7.0 12.0* 9.4  NEUTROABS 5.6  --   --   --   HGB 10.1* 10.0* 10.9* 10.5*  HCT 29.2* 28.6* 30.3* 29.6*  MCV 95.1 94.1 94.1 93.1  PLT 97* 103* 148* 133*   Cardiac Enzymes:  Recent Labs Lab 05/09/13 1624 05/09/13 2230 05/11/13 0435 05/11/13 0835 05/11/13 1418  TROPONINI 2.53* 1.55* 1.43* 1.47* 1.33*   BNP (last 3 results)  Recent Labs  05/10/13 1855  PROBNP 18246.0*   CBG:  Recent Labs Lab 05/13/13 1219 05/13/13 1657 05/13/13 2207 05/14/13 0736 05/14/13 1139  GLUCAP 315* 252* 273* 202* 301*    Recent Results (from the past 240 hour(s))  MRSA PCR SCREENING     Status: None   Collection Time    05/09/13  5:17 PM      Result Value Ref Range Status   MRSA by PCR NEGATIVE  NEGATIVE Final   Comment:            The GeneXpert MRSA Assay (FDA  approved for NASAL specimens     only), is one component of a     comprehensive MRSA colonization     surveillance program. It is not     intended to diagnose MRSA     infection nor to guide or     monitor treatment for     MRSA infections.      Time spent : 25 minutes       LOS: 5 days   HERNANDEZ ACOSTA,Dylann Layne,MD [phone] 05/14/2013, 3:27 PM

## 2013-05-14 NOTE — Progress Notes (Signed)
SUBJECTIVE: Pt says "I feel much better". Denies chest pain, palpitations, and shortness of breath. Sense of abdominal fullness.     Intake/Output Summary (Last 24 hours) at 05/14/13 0810 Last data filed at 05/14/13 0737  Gross per 24 hour  Intake      0 ml  Output   1650 ml  Net  -1650 ml    Current Facility-Administered Medications  Medication Dose Route Frequency Provider Last Rate Last Dose  . 0.9 %  sodium chloride infusion   Intravenous Continuous Erick Colace, NP 10 mL/hr at 05/12/13 0700    . albuterol (PROVENTIL) (2.5 MG/3ML) 0.083% nebulizer solution 2.5 mg  2.5 mg Nebulization Q3H PRN Wilhelmina Mcardle, MD      . aspirin EC tablet 81 mg  81 mg Oral q morning - 10a Belkys A Regalado, MD   81 mg at 05/13/13 1050  . atorvastatin (LIPITOR) tablet 20 mg  20 mg Oral q morning - 10a Belkys A Regalado, MD   20 mg at 05/13/13 1051  . carvedilol (COREG) tablet 25 mg  25 mg Oral BID WC Fay Records, MD      . cholecalciferol (VITAMIN D) tablet 2,000 Units  2,000 Units Oral Daily Belkys A Regalado, MD   2,000 Units at 05/13/13 1051  . docusate sodium (COLACE) capsule 100 mg  100 mg Oral BID Belkys A Regalado, MD   100 mg at 05/13/13 2129  . enoxaparin (LOVENOX) injection 30 mg  30 mg Subcutaneous Q24H Wilhelmina Mcardle, MD   30 mg at 05/13/13 1649  . ferrous sulfate tablet 325 mg  325 mg Oral Q breakfast Belkys A Regalado, MD   325 mg at 05/13/13 0907  . fluticasone (FLONASE) 50 MCG/ACT nasal spray 2 spray  2 spray Each Nare Daily Belkys A Regalado, MD   2 spray at 05/13/13 1236  . furosemide (LASIX) injection 40 mg  40 mg Intravenous Once Erick Colace, NP      . hydrALAZINE (APRESOLINE) injection 10-40 mg  10-40 mg Intravenous Q4H PRN Rigoberto Noel, MD      . HYDROcodone-acetaminophen (NORCO/VICODIN) 5-325 MG per tablet 2 tablet  2 tablet Oral Q6H PRN Elmarie Shiley, MD   2 tablet at 05/13/13 2129  . insulin aspart (novoLOG) injection 0-9 Units  0-9 Units Subcutaneous TID  WC Belkys A Regalado, MD   5 Units at 05/13/13 1726  . ipratropium-albuterol (DUONEB) 0.5-2.5 (3) MG/3ML nebulizer solution 3 mL  3 mL Nebulization TID Debbe Odea, MD      . loratadine (CLARITIN) tablet 10 mg  10 mg Oral Daily Belkys A Regalado, MD   10 mg at 05/13/13 1051  . metoprolol (LOPRESSOR) injection 2.5-5 mg  2.5-5 mg Intravenous Q3H PRN Rigoberto Noel, MD      . metoprolol (LOPRESSOR) injection 5 mg  5 mg Intravenous Once Erick Colace, NP      . morphine 2 MG/ML injection 1 mg  1 mg Intravenous Q4H PRN Rigoberto Noel, MD      . niacin (SLO-NIACIN) CR tablet 500 mg  500 mg Oral Daily Belkys A Regalado, MD   500 mg at 05/13/13 1051  . nitroGLYCERIN 0.2 mg/mL in dextrose 5 % infusion  10 mcg/min Intravenous Titrated Erick Colace, NP   10 mcg/min at 05/12/13 0700  . ondansetron (ZOFRAN) tablet 4 mg  4 mg Oral Q6H PRN Elmarie Shiley, MD  Or  . ondansetron (ZOFRAN) injection 4 mg  4 mg Intravenous Q6H PRN Belkys A Regalado, MD      . oseltamivir (TAMIFLU) capsule 30 mg  30 mg Oral BID Kendra P Hiatt, RPH      . polyvinyl alcohol (LIQUIFILM TEARS) 1.4 % ophthalmic solution 1 drop  1 drop Both Eyes Daily PRN Belkys A Regalado, MD      . temazepam (RESTORIL) capsule 30 mg  30 mg Oral QHS PRN Belkys A Regalado, MD   30 mg at 05/13/13 2129    Filed Vitals:   05/13/13 1546 05/13/13 2109 05/13/13 2211 05/14/13 0626  BP:   155/68 149/85  Pulse:   101 107  Temp:   98.2 F (36.8 C) 98.1 F (36.7 C)  TempSrc:   Oral Oral  Resp:   18 18  Height:      Weight:      SpO2: 94% 96% 95% 98%    PHYSICAL EXAM General: NAD Neck: No JVD, no thyromegaly or thyroid nodule.  Lungs: Bilateral rhonchi with normal respiratory effort. CV: Nondisplaced PMI.  Regular rhythm, tachycardic,  normal S1/S2, no S3/S4, no murmur.  No pretibial edema.  No carotid bruit.  Normal pedal pulses.  Abdomen: Soft, nontender, no hepatosplenomegaly, no distention.  Neurologic: Alert and oriented x 3.  Psych:  Normal affect. Extremities: No clubbing or cyanosis.     LABS: Basic Metabolic Panel:  Recent Labs  05/13/13 0510 05/14/13 0414  NA 137 138  K 3.3* 3.7  CL 97 97  CO2 21 25  GLUCOSE 208* 211*  BUN 51* 48*  CREATININE 1.95* 1.66*  CALCIUM 9.0 9.3   Liver Function Tests: No results found for this basename: AST, ALT, ALKPHOS, BILITOT, PROT, ALBUMIN,  in the last 72 hours No results found for this basename: LIPASE, AMYLASE,  in the last 72 hours CBC:  Recent Labs  05/12/13 0240  WBC 9.4  HGB 10.5*  HCT 29.6*  MCV 93.1  PLT 133*   Cardiac Enzymes:  Recent Labs  05/11/13 0835 05/11/13 1418  TROPONINI 1.47* 1.33*   BNP: No components found with this basename: POCBNP,  D-Dimer: No results found for this basename: DDIMER,  in the last 72 hours Hemoglobin A1C: No results found for this basename: HGBA1C,  in the last 72 hours Fasting Lipid Panel: No results found for this basename: CHOL, HDL, LDLCALC, TRIG, CHOLHDL, LDLDIRECT,  in the last 72 hours Thyroid Function Tests: No results found for this basename: TSH, T4TOTAL, FREET3, T3FREE, THYROIDAB,  in the last 72 hours Anemia Panel: No results found for this basename: VITAMINB12, FOLATE, FERRITIN, TIBC, IRON, RETICCTPCT,  in the last 72 hours  RADIOLOGY: Dg Chest 2 View  05/09/2013   CLINICAL DATA:  Upper respiratory infection.  EXAM: CHEST  2 VIEW  COMPARISON:  Chest CT 05/30/2010.  FINDINGS: Stable surgical changes with a aortic stent graft. The heart is mildly enlarged. There is tortuosity of the thoracic aorta. Mild peribronchial thickening could reflect bronchitis. Vague opacity in the right upper lobe could be a developing infiltrate. Colonic interposition is noted. The bony thorax is intact.  IMPRESSION: Mild cardiac enlargement and probable chronic bronchitic changes. The  Suspect developing right upper lobe infiltrate.   Electronically Signed   By: Kalman Jewels M.D.   On: 05/09/2013 07:33   Dg Chest Port 1  View  05/12/2013   CLINICAL DATA:  Cough and shortness of breath.  EXAM: PORTABLE CHEST - 1 VIEW  COMPARISON:  Single view of the chest 05/11/2013.  FINDINGS: Right much worse than left airspace seen on the most recent plain film has markedly improved. Residual airspace opacity is most notable in the right upper lobe. There is no pneumothorax or pleural effusion. Heart size is normal. Aortic stent graft is noted.  IMPRESSION: Marked improvement in bilateral airspace disease. No new abnormality.   Electronically Signed   By: Inge Rise M.D.   On: 05/12/2013 07:42   Dg Chest Port 1 View  05/11/2013   CLINICAL DATA:  Shortness of breath  EXAM: PORTABLE CHEST - 1 VIEW  COMPARISON:  May 09, 2013  FINDINGS: The heart size and mediastinal contours are stable. Stable postsurgical changes with aortic stent graft noted. There is interval developed diffuse airspace opacity of the entire right lung and the left lung base. The visualized skeletal structures are stable.  IMPRESSION: Interval developed diffuse airspace opacity of the entire right lung and the left lung base. Differential diagnosis includes multi lobar pneumonia or less likely asymmetric pulmonary edema.   Electronically Signed   By: Abelardo Diesel M.D.   On: 05/11/2013 02:39      ASSESSMENT AND PLAN: 1 NSTEMI: Probable demand ischemia in setting of acute illness/CHF. Plan for conservative Rx. Currently on ASA, Lipitor, and Coreg. 2. Acute on chronic systolic CHF LVEF 45 to [id number]  Coreg recently increased to 25 bid. Continue to follow BP and HR. No ACEI due to renal insufficiency. Had been on benazepril at home. Will hold Lasix today, but may consider starting 20 mg po daily within next few days as maintenance dosing vs prn for SOB/leg swelling. Continue to monitor renal function. Patient remains tachycardic, likely secondary to acute illness, demand ischemia, deconditioning. Previous chest xray which showed improvement from the preceding film  likely represented asymmetric pulmonary edema given rapid improvement. Will start short-acting nitrates. 3 Pulm: Acute failure related to acute systolic CHF and influenza  4. HTN: monitor. Starting short-acting nitrates today. May consider scheduled hydralazine on 2/15. 5. DM  6. ID On oseltamvir for total of 5 days.  7 Renal: improving. Holding diuretics.    Kate Sable, M.D., F.A.C.C.

## 2013-05-15 LAB — GLUCOSE, CAPILLARY
GLUCOSE-CAPILLARY: 199 mg/dL — AB (ref 70–99)
GLUCOSE-CAPILLARY: 207 mg/dL — AB (ref 70–99)
Glucose-Capillary: 187 mg/dL — ABNORMAL HIGH (ref 70–99)
Glucose-Capillary: 238 mg/dL — ABNORMAL HIGH (ref 70–99)

## 2013-05-15 NOTE — Progress Notes (Signed)
Progress Note  VONTE ROSSIN UKG:[id number] DOB: 05-26-1930 DOA: 05/09/2013 PCP: [name], MD  Brief narrative: 80 year old male patient with past medical history of hypertension diabetes and stroke. Presented to the emergency department after experiencing a fall. Patient was trying to dress himself he apparently fell onto the floor. Family noted patient with decreased mobility for several days. In addition he has had productive cough and dyspnea for 6 days. No chest pain no fever. Endorses on also sick with unknown illness.  Emergency department his troponin was elevated at 8.0 and his creatinine was 4.3. Chest x-ray was concerning for chronic bronchitic changes and possibly an early evolving right upper lobe infiltrate. Despite a normal troponin EKG was unremarkable cardiology was consulted in the emergency department.  After admission developed HTN emergency with acute resp failure that required BiPAP and tx to ICU. Treated successfully without intubation. Pt requested to be made DNR. Cardiology had been consulted earlier due to NSTEMI.  Assessment/Plan: Active Problems:   NSTEMI (non-ST elevated myocardial infarction) -Per cardiology noting best approach conservative therapy-suspected demand ischemia in setting of acute illness and CHF -Continue aspirin, statin, beta blocker -Cards dc Plavix 2/13 -2-D echocardiogram with septal and inferior wall hypokinesis with mild reduced systolic function EF 76-28%. -No plans to pursue invasive evaluation coronary disease    Acute respiratory failure with hypoxia: A) Influenza A with respiratory manifestations B) Acute systolic heart failure -started on TAMIFLU after admit -since possibility of secondary bacterial PNA anbx's were also prescribed at [facility] felt no PNA so anbx's dc'd 2/12 -Cards managing diuresis-BUN/cr up so holding Lasix -changing CCB to Coreg 2/14    Hypertension  -BP controlled -had issues with HTN crisis after  hydrated-suspect flash edema in setting of uncontrolled HTN    Diabetes mellitus, type 2 -CBGs improving and now less than 200 -Continue SSI -Was on Amaryl prior to admission    Acute renal failure  - had significant decrease in BUN and creatinine as compared to admission with creatinine climbing to 2.39 therefore IVFs cont'd -now BUN once again climbing after aggressive diuresis so Lasix on hold - Noted back in 2003 his creatinine was 1.2 but no recent for comparison -Follow electrolytes    Dehydration -resolved   Deconditioning -tachycardic with activity-? deconditioned -PT/OT and mobilize    Occlusion and stenosis of carotid artery without mention of cerebral infarction    High cholesterol    PAD (peripheral artery disease)   DVT prophylaxis: IV heparin infusion Code Status: Full Family Communication: Spoke with son at bedside Disposition Plan/Expected LOS Likely home in am with [facility] services   Consultants: Cardiology  Procedures: 2-D echocardiogram  - Left ventricle: Septal and inferior wall hypokinesis The cavity size was mildly dilated. Wall thickness was normal. Systolic function was mildly reduced. The estimated ejection fraction was in the range of 45% to 50%. - Mitral valve: Mild regurgitation. - Left atrium: The atrium was mildly dilated. - Atrial septum: No defect or patent foramen ovale was identified. - Impressions: No vegetations appreciated Image quality suboptimal Impressions:  - No vegetations appreciated Image quality suboptimal No cardiac source of emboli was indentified.   Antibiotics: Rocephin 2/9 >>> 2/12 Zithromax 2/9 >>> 2/12 Tamiflu 2/9 >>>  HPI/Subjective: Alert and without complaints. No CP or SOB. Able to reposition self to upright wile in the bed  Objective: Blood pressure 135/64, pulse 98, temperature 98.4 F (36.9 C), temperature source Oral, resp. rate 17, height 5\' 5"  (1.651 m), weight 68.4 kg (150  lb 12.7 oz), SpO2  90.00%.  Intake/Output Summary (Last 24 hours) at 05/15/13 1319 Last data filed at 05/15/13 0715  Gross per 24 hour  Intake    480 ml  Output    926 ml  Net   -446 ml     Exam: General: No acute respiratory distress Lungs: Crackles bilateral bases but no tachypnea, 3 L Cardiovascular: Regular tachycardic rate and rhythm without murmur gallop or rub normal S1 and S2, no peripheral edema or JVD Abdomen: Nontender, nondistended, soft, bowel sounds positive, no rebound, no ascites, no appreciable mass Musculoskeletal: No significant cyanosis, clubbing of bilateral lower extremities Neurological: Awake, moves all extremities x 4 without focal neurological deficits, CN 2-12 intact  Scheduled Meds:  Scheduled Meds: . aspirin EC  81 mg Oral q morning - 10a  . atorvastatin  20 mg Oral q morning - 10a  . carvedilol  25 mg Oral BID WC  . cholecalciferol  2,000 Units Oral Daily  . docusate sodium  100 mg Oral BID  . enoxaparin (LOVENOX) injection  30 mg Subcutaneous Q24H  . ferrous sulfate  325 mg Oral Q breakfast  . fluticasone  2 spray Each Nare Daily  . furosemide  40 mg Intravenous Once  . insulin aspart  0-9 Units Subcutaneous TID WC  . isosorbide dinitrate  10 mg Oral TID  . loratadine  10 mg Oral Daily  . metoprolol  5 mg Intravenous Once  . niacin  500 mg Oral Daily  . oseltamivir  30 mg Oral BID  . pantoprazole  40 mg Oral Daily   Continuous Infusions: . sodium chloride 10 mL/hr at 05/12/13 0700    Data Reviewed: Basic Metabolic Panel:  Recent Labs Lab 05/10/13 0258 05/11/13 0435 05/11/13 1418 05/13/13 0510 05/14/13 0414  NA 142 143 139 137 138  K 4.5 5.1 4.0 3.3* 3.7  CL 109 109 103 97 97  CO2 17* 19 18* 21 25  GLUCOSE 166* 291* 226* 208* 211*  BUN 55* 36* 36* 51* 48*  CREATININE 2.39* 1.76* 1.68* 1.95* 1.66*  CALCIUM 8.4 8.3* 8.8 9.0 9.3   Liver Function Tests: No results found for this basename: AST, ALT, ALKPHOS, BILITOT, PROT, ALBUMIN,  in the last 168  hours No results found for this basename: LIPASE, AMYLASE,  in the last 168 hours No results found for this basename: AMMONIA,  in the last 168 hours CBC:  Recent Labs Lab 05/09/13 0739 05/10/13 0258 05/11/13 0435 05/12/13 0240  WBC 7.0 7.0 12.0* 9.4  NEUTROABS 5.6  --   --   --   HGB 10.1* 10.0* 10.9* 10.5*  HCT 29.2* 28.6* 30.3* 29.6*  MCV 95.1 94.1 94.1 93.1  PLT 97* 103* 148* 133*   Cardiac Enzymes:  Recent Labs Lab 05/09/13 1624 05/09/13 2230 05/11/13 0435 05/11/13 0835 05/11/13 1418  TROPONINI 2.53* 1.55* 1.43* 1.47* 1.33*   BNP (last 3 results)  Recent Labs  05/10/13 1855  PROBNP 18246.0*   CBG:  Recent Labs Lab 05/14/13 1139 05/14/13 1646 05/14/13 2238 05/15/13 0748 05/15/13 1154  GLUCAP 301* 191* 248* 199* 187*    Recent Results (from the past 240 hour(s))  MRSA PCR SCREENING     Status: None   Collection Time    05/09/13  5:17 PM      Result Value Ref Range Status   MRSA by PCR NEGATIVE  NEGATIVE Final   Comment:            The GeneXpert MRSA Assay (  FDA     approved for NASAL specimens     only), is one component of a     comprehensive MRSA colonization     surveillance program. It is not     intended to diagnose MRSA     infection nor to guide or     monitor treatment for     MRSA infections.      Time spent : 25 minutes       LOS: 6 days   HERNANDEZ ACOSTA,ESTELA,MD [phone] 05/15/2013, 1:19 PM

## 2013-05-15 NOTE — Progress Notes (Signed)
SUBJECTIVE:   80 y/o with severe PAD, CKD but no documented CAD. Admitted with renal failure and small NSTEMI in setting of the flu and severe distress. C/b by acute HF. ECHO EF 45-50%   Feels great. Walking halls without difficulty. No CP. Cr back down to 1.6. Weight down.   SBP 103-135   Intake/Output Summary (Last 24 hours) at 05/15/13 1251 Last data filed at 05/15/13 0715  Gross per 24 hour  Intake    480 ml  Output    926 ml  Net   -446 ml    Current Facility-Administered Medications  Medication Dose Route Frequency Provider Last Rate Last Dose  . 0.9 %  sodium chloride infusion   Intravenous Continuous Erick Colace, NP 10 mL/hr at 05/12/13 0700    . albuterol (PROVENTIL) (2.5 MG/3ML) 0.083% nebulizer solution 2.5 mg  2.5 mg Nebulization Q3H PRN Wilhelmina Mcardle, MD      . aspirin EC tablet 81 mg  81 mg Oral q morning - 10a Belkys A Regalado, MD   81 mg at 05/15/13 1105  . atorvastatin (LIPITOR) tablet 20 mg  20 mg Oral q morning - 10a Belkys A Regalado, MD   20 mg at 05/15/13 1106  . carvedilol (COREG) tablet 25 mg  25 mg Oral BID WC Fay Records, MD   25 mg at 05/15/13 0816  . cholecalciferol (VITAMIN D) tablet 2,000 Units  2,000 Units Oral Daily Belkys A Regalado, MD   2,000 Units at 05/15/13 1106  . docusate sodium (COLACE) capsule 100 mg  100 mg Oral BID Belkys A Regalado, MD   100 mg at 05/15/13 1105  . enoxaparin (LOVENOX) injection 30 mg  30 mg Subcutaneous Q24H Wilhelmina Mcardle, MD   30 mg at 05/14/13 1817  . ferrous sulfate tablet 325 mg  325 mg Oral Q breakfast Belkys A Regalado, MD   325 mg at 05/15/13 0816  . fluticasone (FLONASE) 50 MCG/ACT nasal spray 2 spray  2 spray Each Nare Daily Belkys A Regalado, MD   2 spray at 05/15/13 1105  . furosemide (LASIX) injection 40 mg  40 mg Intravenous Once Erick Colace, NP      . hydrALAZINE (APRESOLINE) injection 10-40 mg  10-40 mg Intravenous Q4H PRN Rigoberto Noel, MD      . HYDROcodone-acetaminophen  (NORCO/VICODIN) 5-325 MG per tablet 2 tablet  2 tablet Oral Q6H PRN Elmarie Shiley, MD   2 tablet at 05/13/13 2129  . insulin aspart (novoLOG) injection 0-9 Units  0-9 Units Subcutaneous TID WC Belkys A Regalado, MD   2 Units at 05/15/13 1229  . isosorbide dinitrate (ISORDIL) tablet 10 mg  10 mg Oral TID Herminio Commons, MD   10 mg at 05/15/13 1106  . loratadine (CLARITIN) tablet 10 mg  10 mg Oral Daily Belkys A Regalado, MD   10 mg at 05/15/13 1106  . metoprolol (LOPRESSOR) injection 2.5-5 mg  2.5-5 mg Intravenous Q3H PRN Rigoberto Noel, MD      . metoprolol (LOPRESSOR) injection 5 mg  5 mg Intravenous Once Erick Colace, NP      . morphine 2 MG/ML injection 1 mg  1 mg Intravenous Q4H PRN Rigoberto Noel, MD      . niacin (SLO-NIACIN) CR tablet 500 mg  500 mg Oral Daily Belkys A Regalado, MD   500 mg at 05/15/13 1106  . ondansetron (ZOFRAN) tablet 4 mg  4 mg Oral Q6H PRN Belkys A Regalado, MD       Or  . ondansetron (ZOFRAN) injection 4 mg  4 mg Intravenous Q6H PRN Belkys A Regalado, MD      . oseltamivir (TAMIFLU) capsule 30 mg  30 mg Oral BID Gaynell Face Hiatt, RPH   30 mg at 05/15/13 1105  . pantoprazole (PROTONIX) EC tablet 40 mg  40 mg Oral Daily Erline Hau, MD   40 mg at 05/15/13 0817  . polyvinyl alcohol (LIQUIFILM TEARS) 1.4 % ophthalmic solution 1 drop  1 drop Both Eyes Daily PRN Belkys A Regalado, MD      . temazepam (RESTORIL) capsule 30 mg  30 mg Oral QHS PRN Belkys A Regalado, MD   30 mg at 05/14/13 2106    Filed Vitals:   05/14/13 2244 05/15/13 0518 05/15/13 0920 05/15/13 1033  BP: 103/57 135/64    Pulse: 101 98    Temp: 98.5 F (36.9 C) 98.4 F (36.9 C)    TempSrc: Oral     Resp: 17 17    Height:      Weight:      SpO2: 94% 97% 100% 90%    PHYSICAL EXAM General: NAD  Sitting on side of bed eating lunch Neck: No JVD, no thyromegaly or thyroid nodule.  Lungs: clear with reduced BS throughout CV: Nondisplaced PMI.  Regular rhythm  normal S1/S2, no  S3/S4, no murmur.  No pretibial edema.  No carotid bruit.  Abdomen: Soft, nontender, no hepatosplenomegaly, no distention.  Neurologic: Alert and oriented x 3.  Psych: Normal affect. Extremities: No clubbing or cyanosis.     LABS: Basic Metabolic Panel:  Recent Labs  05/13/13 0510 05/14/13 0414  NA 137 138  K 3.3* 3.7  CL 97 97  CO2 21 25  GLUCOSE 208* 211*  BUN 51* 48*  CREATININE 1.95* 1.66*  CALCIUM 9.0 9.3   Liver Function Tests: No results found for this basename: AST, ALT, ALKPHOS, BILITOT, PROT, ALBUMIN,  in the last 72 hours No results found for this basename: LIPASE, AMYLASE,  in the last 72 hours CBC: No results found for this basename: WBC, NEUTROABS, HGB, HCT, MCV, PLT,  in the last 72 hours Cardiac Enzymes: No results found for this basename: CKTOTAL, CKMB, CKMBINDEX, TROPONINI,  in the last 72 hours BNP: No components found with this basename: POCBNP,  D-Dimer: No results found for this basename: DDIMER,  in the last 72 hours Hemoglobin A1C: No results found for this basename: HGBA1C,  in the last 72 hours Fasting Lipid Panel: No results found for this basename: CHOL, HDL, LDLCALC, TRIG, CHOLHDL, LDLDIRECT,  in the last 72 hours Thyroid Function Tests: No results found for this basename: TSH, T4TOTAL, FREET3, T3FREE, THYROIDAB,  in the last 72 hours Anemia Panel: No results found for this basename: VITAMINB12, FOLATE, FERRITIN, TIBC, IRON, RETICCTPCT,  in the last 72 hours  RADIOLOGY: Dg Chest 2 View  05/09/2013   CLINICAL DATA:  Upper respiratory infection.  EXAM: CHEST  2 VIEW  COMPARISON:  Chest CT 05/30/2010.  FINDINGS: Stable surgical changes with a aortic stent graft. The heart is mildly enlarged. There is tortuosity of the thoracic aorta. Mild peribronchial thickening could reflect bronchitis. Vague opacity in the right upper lobe could be a developing infiltrate. Colonic interposition is noted. The bony thorax is intact.  IMPRESSION: Mild cardiac  enlargement and probable chronic bronchitic changes. The  Suspect developing right upper lobe infiltrate.  Electronically Signed   By: Kalman Jewels M.D.   On: 05/09/2013 07:33   Dg Chest Port 1 View  05/12/2013   CLINICAL DATA:  Cough and shortness of breath.  EXAM: PORTABLE CHEST - 1 VIEW  COMPARISON:  Single view of the chest 05/11/2013.  FINDINGS: Right much worse than left airspace seen on the most recent plain film has markedly improved. Residual airspace opacity is most notable in the right upper lobe. There is no pneumothorax or pleural effusion. Heart size is normal. Aortic stent graft is noted.  IMPRESSION: Marked improvement in bilateral airspace disease. No new abnormality.   Electronically Signed   By: Inge Rise M.D.   On: 05/12/2013 07:42   Dg Chest Port 1 View  05/11/2013   CLINICAL DATA:  Shortness of breath  EXAM: PORTABLE CHEST - 1 VIEW  COMPARISON:  May 09, 2013  FINDINGS: The heart size and mediastinal contours are stable. Stable postsurgical changes with aortic stent graft noted. There is interval developed diffuse airspace opacity of the entire right lung and the left lung base. The visualized skeletal structures are stable.  IMPRESSION: Interval developed diffuse airspace opacity of the entire right lung and the left lung base. Differential diagnosis includes multi lobar pneumonia or less likely asymmetric pulmonary edema.   Electronically Signed   By: Abelardo Diesel M.D.   On: 05/11/2013 02:39      ASSESSMENT AND PLAN: 1 NSTEMI: Probable demand ischemia in setting of acute illness/CHF. Plan for conservative Rx. Currently on ASA, Lipitor, and Coreg. Consider outpatient Myoview to exclude high risk features. Avoid cath if possible due to recent renal failure.  2. Acute on chronic systolic CHF LVEF 45 to 65%  Coreg recently increased to 25 bid. Continue to follow BP and HR. No ACEI now due to renal insufficiency. Had been on benazepril at home but will hold for  now. Will hold Lasix today, but may consider starting 20 mg po daily within next few days as maintenance dosing vs prn for SOB/leg swelling. Continue to monitor renal function. Patient remains tachycardic, likely secondary to acute illness, demand ischemia, deconditioning. Previous chest xray which showed improvement from the preceding film likely represented asymmetric pulmonary edema given rapid improvement. Will start short-acting nitrates. 3 Pulm: Acute failure related to acute systolic CHF and influenza  4. HTN: controlled 5. DM  6. ID On oseltamvir for total of 5 days.  7 Renal failure: improving. Holding diuretics.   Much improved. On good meds. We will sign off. He would like to follow up with his son's cardiologist Dr. Wynonia Lawman as outpatient. Consider outpatient Myoview.  Brenna Friesenhahn,MD 12:51 PM

## 2013-05-15 NOTE — Progress Notes (Signed)
Patient is doing well on room air, oxygen saturation on room air was 100%. Patient was ambulating in [place] with droplet mask on and was 90-91% while ambulating.

## 2013-05-16 LAB — GLUCOSE, CAPILLARY: Glucose-Capillary: 191 mg/dL — ABNORMAL HIGH (ref 70–99)

## 2013-05-16 MED ORDER — DSS 100 MG PO CAPS: 100.0000 mg | ORAL_CAPSULE | Freq: Two times a day (BID) | ORAL | Status: DC

## 2013-05-16 MED ORDER — ISOSORBIDE DINITRATE 10 MG PO TABS: 10.0000 mg | ORAL_TABLET | Freq: Three times a day (TID) | ORAL | Status: AC

## 2013-05-16 MED ORDER — CARVEDILOL 25 MG PO TABS: 25.0000 mg | ORAL_TABLET | Freq: Two times a day (BID) | ORAL | Status: DC

## 2013-05-16 MED ORDER — OSELTAMIVIR PHOSPHATE 30 MG PO CAPS: 30.0000 mg | ORAL_CAPSULE | Freq: Two times a day (BID) | ORAL | Status: DC

## 2013-05-16 NOTE — Care Management Note (Signed)
    Page 1 of 2   05/16/2013     10:17:32 AM   CARE MANAGEMENT NOTE 05/16/2013  Patient:  [name], [name]   Account Number:  [id number]  Date Initiated:  05/10/2013  Documentation initiated by:  [facility]  Subjective/Objective Assessment:   N-STEMI, Acute Kidney Failure     Action/Plan:   lives at home with son, [name] #[phone]   Anticipated DC Date:  05/16/2013   Anticipated DC Plan:  [place] referral  Clinical Social Worker      DC Planning Services  CM consult      Choice offered to / List presented to:  C-4 Adult Children        [place] arranged  HH-1 RN  [place].   Status of service:  In process, will continue to follow Medicare Important Message given?   (If response is "NO", the following Medicare IM given date fields will be blank) Date Medicare IM given:   Date Additional Medicare IM given:    Discharge Disposition:    Per UR Regulation:    If discussed at Long Length of Stay Meetings, dates discussed:    Comments:  05-16-13 Spoke to patient and son [name] [phone]  at bedside picked  [place] referral given . Patient already has a walker at home .  Magdalen Spatz RN BSN    05/10/2013 0900 NCM spoke to pt's son, [name]. Son states he lives at home with pt. Pt was independent at home prior to admission. He is limited in lifting and bathing pt due to his health. He is interested in SNF-rehab. States he believes pt has [place] that will help pay for an aide in the home once he lives SNF. Will provide son with private duty list. States he was looking at following up with [place]. Has RW, and shower chair at home. Jonnie Finner RN CCM Case Mgmt phone [phone]

## 2013-05-16 NOTE — Discharge Summary (Signed)
Physician Discharge Summary  [name] [id number] DOB: 1930/06/06 DOA: 05/09/2013  PCP: [name], MD  Admit date: 05/09/2013 Discharge date: 05/16/2013  Time spent: 45 minutes  Recommendations for Outpatient Follow-up:  -Will be discharged home today. -[facility] services (PT/OT/ST) will be arranged. -Advised to follow up with PCP in 2 weeks.   Discharge Diagnoses:  Active Problems:   Occlusion and stenosis of carotid artery without mention of cerebral infarction   Hypertension   High cholesterol   PAD (peripheral artery disease)   Diabetes mellitus, type 2   Acute renal failure   NSTEMI (non-ST elevated myocardial infarction)   Influenza A with respiratory manifestations   Acute respiratory failure with hypoxia   Dehydration   Discharge Condition: Stable and improved  Filed Weights   05/11/13 0447 05/12/13 0500 05/13/13 0500  Weight: 71.215 kg (157 lb) 70.4 kg (155 lb 3.3 oz) 68.4 kg (150 lb 12.7 oz)    History of present illness:  [name] is a 80 y.o. male with PMH significant for Hypertension, Diabetes, stroke who presents to ED after a fall. Patient was trying to dressed himself when he fell on the floor. Patient has had decrease mobility for last few days. Patient also relates productive cough, dyspnea for last 6 days. He denies chest pain. He denies fever. His son has been also sick.  Evaluation in the ED; Troponin at 8, cr at 4.3. No prior cr available. Chest x ray; Mild cardiac enlargement and probable chronic bronchitic changes.  The Suspect developing right upper lobe infiltrate. Hospitalist admission was requested.   Hospital Course:   NSTEMI (non-ST elevated myocardial infarction)  -Per cardiology noting best approach conservative therapy-suspected demand ischemia in setting of acute illness and CHF  -Continue aspirin, statin, beta blocker -Cards dc Plavix 2/13  -2-D echocardiogram with septal and inferior wall hypokinesis with mild reduced systolic  function EF [id number].  -No plans to pursue invasive evaluation coronary disease   Acute respiratory failure with hypoxia:  A) Influenza A with respiratory manifestations  B) Acute systolic heart failure  -started on TAMIFLU; has 3 days remaining on DC. -since possibility of secondary bacterial PNA anbx's were also prescribed at [facility] felt no PNA so anbx's dc'd 2/12  -Changing CCB to Coreg 2/14   Hypertension  -BP controlled  -had issues with HTN crisis after hydrated-suspect flash edema in setting of uncontrolled HTN   Diabetes mellitus, type 2  -CBGs improving and now less than 200  -Continue Amaryl.  Acute renal failure  - had significant decrease in BUN and creatinine as compared to admission with creatinine climbing to 2.39. - Noted back in 2003 his creatinine was 1.2 but no recent for comparison  -Cr on DC is 1.66.  Dehydration  -resolved   Deconditioning  -tachycardic with activity-? deconditioned  -PT/OT and mobilize   Occlusion and stenosis of carotid artery without mention of cerebral infarction   High cholesterol   PAD (peripheral artery disease)      Procedures:  None   Consultations:  CC[facility]  Cardiology  Discharge Instructions  Discharge Orders   Future Appointments Provider Department Dept Phone   05/24/2013 1:40 PM Harl Bowie, [facility] [place]   06/09/2013 2:00 PM [facility] [address] [phone]   06/09/2013 3:00 PM [facility] [place] [id number]   06/09/2013 3:40 PM Sharmon Leyden Nickel, NP [facility] [phone]   Future Orders Complete  By Expires   Diet - low sodium heart healthy  As directed    Discontinue IV  As directed    Increase activity slowly  As directed        Medication List    STOP taking these medications       diltiazem 240 MG 24 hr capsule  Commonly known as:  CARDIZEM CD      TAKE these  medications       aspirin EC 81 MG tablet  Take 81 mg by mouth every morning.     atorvastatin 40 MG tablet  Commonly known as:  LIPITOR  Take 20 mg by mouth every morning.     benazepril 10 MG tablet  Commonly known as:  LOTENSIN  Take 10 mg by mouth at bedtime.     carvedilol 25 MG tablet  Commonly known as:  COREG  Take 1 tablet (25 mg total) by mouth 2 (two) times daily with a meal.     cholecalciferol 1000 UNITS tablet  Commonly known as:  VITAMIN D  Take 2,000 Units by mouth daily.     diphenhydramine-acetaminophen 25-500 MG Tabs  Commonly known as:  TYLENOL PM  Take 1 tablet by mouth at bedtime as needed. For sleep     DSS 100 MG Caps  Take 100 mg by mouth 2 (two) times daily.     ferrous sulfate 325 (65 FE) MG tablet  Take 325 mg by mouth daily with breakfast.     Fish Oil 1200 MG Caps  Take 4,800 capsules by mouth every morning.     fluticasone 50 MCG/ACT nasal spray  Commonly known as:  FLONASE  Place 2 sprays into the nose daily.     furosemide 20 MG tablet  Commonly known as:  LASIX  Take 20 mg by mouth every morning.     glimepiride 2 MG tablet  Commonly known as:  AMARYL  Take 2 mg by mouth daily before breakfast.     HYDROcodone-acetaminophen 5-325 MG per tablet  Commonly known as:  NORCO/VICODIN  Take 2 tablets by mouth every 6 (six) hours as needed for pain.     hydroxypropyl methylcellulose 2.5 % ophthalmic solution  Commonly known as:  ISOPTO TEARS  Place 1 drop into both eyes daily as needed (dry eyes).     isosorbide dinitrate 10 MG tablet  Commonly known as:  ISORDIL  Take 1 tablet (10 mg total) by mouth 3 (three) times daily.     loratadine 10 MG tablet  Commonly known as:  CLARITIN  Take 10 mg by mouth daily.     multivitamin capsule  Take 1 capsule by mouth daily.     niacin 500 MG tablet  Commonly known as:  SLO-NIACIN  Take 500 mg by mouth daily.     omeprazole 20 MG capsule  Commonly known as:  PRILOSEC  Take 20 mg by  mouth daily.     oseltamivir 30 MG capsule  Commonly known as:  TAMIFLU  Take 1 capsule (30 mg total) by mouth 2 (two) times daily. For 3 days     temazepam 30 MG capsule  Commonly known as:  RESTORIL  Take 30 mg by mouth at bedtime as needed. For sleep       Allergies  Allergen Reactions  . Codeine Nausea And Vomiting       Follow-up Information   Follow up with [name] A, MD. Schedule an appointment as soon as possible for a visit  in 2 weeks.   Specialty:  Internal Medicine   Contact information:   [place] [address] [phone]        The results of significant diagnostics from this hospitalization (including imaging, microbiology, ancillary and laboratory) are listed below for reference.    Significant Diagnostic Studies: Dg Chest 2 View  05/09/2013   CLINICAL DATA:  Upper respiratory infection.  EXAM: CHEST  2 VIEW  COMPARISON:  Chest CT 05/30/2010.  FINDINGS: Stable surgical changes with a aortic stent graft. The heart is mildly enlarged. There is tortuosity of the thoracic aorta. Mild peribronchial thickening could reflect bronchitis. Vague opacity in the right upper lobe could be a developing infiltrate. Colonic interposition is noted. The bony thorax is intact.  IMPRESSION: Mild cardiac enlargement and probable chronic bronchitic changes. The  Suspect developing right upper lobe infiltrate.   Electronically Signed   By: Kalman Jewels M.D.   On: 05/09/2013 07:33   Dg Chest Port 1 View  05/12/2013   CLINICAL DATA:  Cough and shortness of breath.  EXAM: PORTABLE CHEST - 1 VIEW  COMPARISON:  Single view of the chest 05/11/2013.  FINDINGS: Right much worse than left airspace seen on the most recent plain film has markedly improved. Residual airspace opacity is most notable in the right upper lobe. There is no pneumothorax or pleural effusion. Heart size is normal. Aortic stent graft is noted.  IMPRESSION: Marked improvement in bilateral airspace disease.  No new abnormality.   Electronically Signed   By: Inge Rise M.D.   On: 05/12/2013 07:42   Dg Chest Port 1 View  05/11/2013   CLINICAL DATA:  Shortness of breath  EXAM: PORTABLE CHEST - 1 VIEW  COMPARISON:  May 09, 2013  FINDINGS: The heart size and mediastinal contours are stable. Stable postsurgical changes with aortic stent graft noted. There is interval developed diffuse airspace opacity of the entire right lung and the left lung base. The visualized skeletal structures are stable.  IMPRESSION: Interval developed diffuse airspace opacity of the entire right lung and the left lung base. Differential diagnosis includes multi lobar pneumonia or less likely asymmetric pulmonary edema.   Electronically Signed   By: Abelardo Diesel M.D.   On: 05/11/2013 02:39    Microbiology: Recent Results (from the past 240 hour(s))  MRSA PCR SCREENING     Status: None   Collection Time    05/09/13  5:17 PM      Result Value Ref Range Status   MRSA by PCR NEGATIVE  NEGATIVE Final   Comment:            The GeneXpert MRSA Assay (FDA     approved for NASAL specimens     only), is one component of a     comprehensive MRSA colonization     surveillance program. It is not     intended to diagnose MRSA     infection nor to guide or     monitor treatment for     MRSA infections.     Labs: Basic Metabolic Panel:  Recent Labs Lab 05/10/13 0258 05/11/13 0435 05/11/13 1418 05/13/13 0510 05/14/13 0414  NA 142 143 139 137 138  K 4.5 5.1 4.0 3.3* 3.7  CL 109 109 103 97 97  CO2 17* 19 18* 21 25  GLUCOSE 166* 291* 226* 208* 211*  BUN 55* 36* 36* 51* 48*  CREATININE 2.39* 1.76* 1.68* 1.95* 1.66*  CALCIUM 8.4 8.3* 8.8 9.0 9.3  Liver Function Tests: No results found for this basename: AST, ALT, ALKPHOS, BILITOT, PROT, ALBUMIN,  in the last 168 hours No results found for this basename: LIPASE, AMYLASE,  in the last 168 hours No results found for this basename: AMMONIA,  in the last 168  hours CBC:  Recent Labs Lab 05/10/13 0258 05/11/13 0435 05/12/13 0240  WBC 7.0 12.0* 9.4  HGB 10.0* 10.9* 10.5*  HCT 28.6* 30.3* 29.6*  MCV 94.1 94.1 93.1  PLT 103* 148* 133*   Cardiac Enzymes:  Recent Labs Lab 05/09/13 1624 05/09/13 2230 05/11/13 0435 05/11/13 0835 05/11/13 1418  TROPONINI 2.53* 1.55* 1.43* 1.47* 1.33*   BNP: BNP (last 3 results)  Recent Labs  05/10/13 1855  PROBNP 18246.0*   CBG:  Recent Labs Lab 05/15/13 0748 05/15/13 1154 05/15/13 1732 05/15/13 2148 05/16/13 0749  GLUCAP 199* 187* 238* 207* 191*       Signed:  HERNANDEZ ACOSTA,ESTELA  [facility] Pager: [phone] 05/16/2013, 2:01 PM

## 2013-05-24 ENCOUNTER — Encounter (INDEPENDENT_AMBULATORY_CARE_PROVIDER_SITE_OTHER): Payer: Medicare Other | Admitting: Surgery

## 2013-05-29 DIAGNOSIS — I509 Heart failure, unspecified: Secondary | ICD-10-CM

## 2013-05-29 HISTORY — DX: Heart failure, unspecified: I50.9

## 2013-06-08 ENCOUNTER — Encounter: Payer: Self-pay | Admitting: Family

## 2013-06-09 ENCOUNTER — Other Ambulatory Visit: Payer: Self-pay | Admitting: *Deleted

## 2013-06-09 ENCOUNTER — Encounter: Payer: Self-pay | Admitting: *Deleted

## 2013-06-09 ENCOUNTER — Ambulatory Visit (HOSPITAL_COMMUNITY)
Admission: RE | Admit: 2013-06-09 | Discharge: 2013-06-09 | Disposition: A | Discharge status: Home / Self Care | Payer: Medicare Other | Source: Ambulatory Visit | Attending: Family | Admitting: Family

## 2013-06-09 ENCOUNTER — Ambulatory Visit (INDEPENDENT_AMBULATORY_CARE_PROVIDER_SITE_OTHER): Payer: Medicare Other | Admitting: Family

## 2013-06-09 ENCOUNTER — Ambulatory Visit (INDEPENDENT_AMBULATORY_CARE_PROVIDER_SITE_OTHER)
Admission: RE | Admit: 2013-06-09 | Discharge: 2013-06-09 | Disposition: A | Discharge status: Home / Self Care | Payer: Medicare Other | Source: Ambulatory Visit | Attending: Vascular Surgery | Admitting: Vascular Surgery

## 2013-06-09 ENCOUNTER — Encounter: Payer: Self-pay | Admitting: Family

## 2013-06-09 VITALS — BP 126/65 | HR 84 | Resp 16 | Ht 65.0 in | Wt 165.0 lb

## 2013-06-09 DIAGNOSIS — Z48812 Encounter for surgical aftercare following surgery on the circulatory system: Secondary | ICD-10-CM | POA: Insufficient documentation

## 2013-06-09 DIAGNOSIS — I6529 Occlusion and stenosis of unspecified carotid artery: Secondary | ICD-10-CM | POA: Insufficient documentation

## 2013-06-09 DIAGNOSIS — I739 Peripheral vascular disease, unspecified: Secondary | ICD-10-CM

## 2013-06-09 NOTE — Patient Instructions (Signed)
Stroke Prevention Some medical conditions and behaviors are associated with an increased chance of having a stroke. You may prevent a stroke by making healthy choices and managing medical conditions. HOW CAN I REDUCE MY RISK OF HAVING A STROKE?   Stay physically active. Get at least 30 minutes of activity on most or all days.  Do not smoke. It may also be helpful to avoid exposure to secondhand smoke.  Limit alcohol use. Moderate alcohol use is considered to be:  No more than 2 drinks per day for men.  No more than 1 drink per day for nonpregnant women.  Eat healthy foods. This involves  Eating 5 or more servings of fruits and vegetables a day.  Following a diet that addresses high blood pressure (hypertension), high cholesterol, diabetes, or obesity.  Manage your cholesterol levels.  A diet low in saturated fat, trans fat, and cholesterol and high in fiber may control cholesterol levels.  Take any prescribed medicines to control cholesterol as directed by your health care provider.  Manage your diabetes.  A controlled-carbohydrate, controlled-sugar diet is recommended to manage diabetes.  Take any prescribed medicines to control diabetes as directed by your health care provider.  Control your hypertension.  A low-salt (sodium), low-saturated fat, low-trans fat, and low-cholesterol diet is recommended to manage hypertension.  Take any prescribed medicines to control hypertension as directed by your health care provider.  Maintain a healthy weight.  A reduced-calorie, low-sodium, low-saturated fat, low-trans fat, low-cholesterol diet is recommended to manage weight.  Stop drug abuse.  Avoid taking birth control pills.  Talk to your health care provider about the risks of taking birth control pills if you are over 35 years old, smoke, get migraines, or have ever had a blood clot.  Get evaluated for sleep disorders (sleep apnea).  Talk to your health care provider about  getting a sleep evaluation if you snore a lot or have excessive sleepiness.  Take medicines as directed by your health care provider.  For some people, aspirin or blood thinners (anticoagulants) are helpful in reducing the risk of forming abnormal blood clots that can lead to stroke. If you have the irregular heart rhythm of atrial fibrillation, you should be on a blood thinner unless there is a good reason you cannot take them.  Understand all your medicine instructions.  Make sure that other other conditions (such as anemia or atherosclerosis) are addressed. SEEK IMMEDIATE MEDICAL CARE IF:   You have sudden weakness or numbness of the face, arm, or leg, especially on one side of the body.  Your face or eyelid droops to one side.  You have sudden confusion.  You have trouble speaking (aphasia) or understanding.  You have sudden trouble seeing in one or both eyes.  You have sudden trouble walking.  You have dizziness.  You have a loss of balance or coordination.  You have a sudden, severe headache with no known cause.  You have new chest pain or an irregular heartbeat. Any of these symptoms may represent a serious problem that is an emergency. Do not wait to see if the symptoms will go away. Get medical help at once. Call your local emergency services  (911 in [place] Do not drive yourself to the hospital. Document Released: 04/24/2004 Document Revised: 01/05/2013 Document Reviewed: 09/17/2012 Exit[employer] Patient Information 2014 [employer].   Peripheral Vascular Disease Peripheral Vascular Disease (PVD), also called Peripheral Arterial Disease (PAD), is a circulation problem caused by cholesterol (atherosclerotic plaque) deposits in the   arteries. PVD commonly occurs in the lower extremities (legs) but it can occur in other areas of the body, such as your arms. The cholesterol buildup in the arteries reduces blood flow which can cause pain and other serious problems. The presence  of PVD can place a person at risk for Coronary Artery Disease (CAD).  CAUSES  Causes of PVD can be many. It is usually associated with more than one risk factor such as:   High Cholesterol.  Smoking.  Diabetes.  Lack of exercise or inactivity.  High blood pressure (hypertension).  Obesity.  Family history. SYMPTOMS   When the lower extremities are affected, patients with PVD may experience:  Leg pain with exertion or physical activity. This is called INTERMITTENT CLAUDICATION. This may present as cramping or numbness with physical activity. The location of the pain is associated with the level of blockage. For example, blockage at the abdominal level (distal abdominal aorta) may result in buttock or hip pain. Lower leg arterial blockage may result in calf pain.  As PVD becomes more severe, pain can develop with less physical activity.  In people with severe PVD, leg pain may occur at rest.  Other PVD signs and symptoms:  Leg numbness or weakness.  Coldness in the affected leg or foot, especially when compared to the other leg.  A change in leg color.  Patients with significant PVD are more prone to ulcers or sores on toes, feet or legs. These may take longer to heal or may reoccur. The ulcers or sores can become infected.  If signs and symptoms of PVD are ignored, gangrene may occur. This can result in the loss of toes or loss of an entire limb.  Not all leg pain is related to PVD. Other medical conditions can cause leg pain such as:  Blood clots (embolism) or Deep Vein Thrombosis.  Inflammation of the blood vessels (vasculitis).  Spinal stenosis. DIAGNOSIS  Diagnosis of PVD can involve several different types of tests. These can include:  Pulse Volume Recording Method (PVR). This test is simple, painless and does not involve the use of X-rays. PVR involves measuring and comparing the blood pressure in the arms and legs. An ABI (Ankle-Brachial Index) is calculated.  The normal ratio of blood pressures is 1. As this number becomes smaller, it indicates more severe disease.  < 0.95  indicates significant narrowing in one or more leg vessels.  <0.8 there will usually be pain in the foot, leg or buttock with exercise.  <0.4 will usually have pain in the legs at rest.  <0.25  usually indicates limb threatening PVD.  Doppler detection of pulses in the legs. This test is painless and checks to see if you have a pulses in your legs/feet.  A dye or contrast material (a substance that highlights the blood vessels so they show up on x-ray) may be given to help your caregiver better see the arteries for the following tests. The dye is eliminated from your body by the kidney's. Your caregiver may order blood work to check your kidney function and other laboratory values before the following tests are performed:  Magnetic Resonance Angiography (MRA). An MRA is a picture study of the blood vessels and arteries. The MRA machine uses a large magnet to produce images of the blood vessels.  Computed Tomography Angiography (CTA). A CTA is a specialized x-ray that looks at how the blood flows in your blood vessels. An IV may be inserted into your arm so contrast dye   can be injected.  Angiogram. Is a procedure that uses x-rays to look at your blood vessels. This procedure is minimally invasive, meaning a small incision (cut) is made in your groin. A small tube (catheter) is then inserted into the artery of your groin. The catheter is guided to the blood vessel or artery your caregiver wants to examine. Contrast dye is injected into the catheter. X-rays are then taken of the blood vessel or artery. After the images are obtained, the catheter is taken out. TREATMENT  Treatment of PVD involves many interventions which may include:  Lifestyle changes:  Quitting smoking.  Exercise.  Following a low fat, low cholesterol diet.  Control of diabetes.  Foot care is very  important to the PVD patient. Good foot care can help prevent infection.  Medication:  Cholesterol-lowering medicine.  Blood pressure medicine.  Anti-platelet drugs.  Certain medicines may reduce symptoms of Intermittent Claudication.  Interventional/Surgical options:  Angioplasty. An Angioplasty is a procedure that inflates a balloon in the blocked artery. This opens the blocked artery to improve blood flow.  Stent Implant. A wire mesh tube (stent) is placed in the artery. The stent expands and stays in place, allowing the artery to remain open.  Peripheral Bypass Surgery. This is a surgical procedure that reroutes the blood around a blocked artery to help improve blood flow. This type of procedure may be performed if Angioplasty or stent implants are not an option. SEEK IMMEDIATE MEDICAL CARE IF:   You develop pain or numbness in your arms or legs.  Your arm or leg turns cold, becomes blue in color.  You develop redness, warmth, swelling and pain in your arms or legs. MAKE SURE YOU:   Understand these instructions.  Will watch your condition.  Will get help right away if you are not doing well or get worse. Document Released: 04/24/2004 Document Revised: 06/09/2011 Document Reviewed: 03/21/2008 [employer] Patient Information 2014 [employer].  

## 2013-06-09 NOTE — Progress Notes (Signed)
[facility] [place] HISTORY AND PHYSICAL   MRN : [id number]  History of Present Illness:   [name] is a 80 y.o. male patient of Dr. Darrick PennaFields who is s/p left fem-pop BPG 02/13/1997 with revision in 1999,  and right external iliac artery PTA in 1997.  He is also s/p innominate artery aneurysm resection and repair by Dr. Madilyn FiremanHayes in 2002. He had lumbar spine surgery in 2002, states he developed a staph infection. He has pain in low back and sometimes in left leg when he walks, uses a rolling walker. He ws told that he had a stoke as evidenced on CT scan 2 years ago, but does not recall any symptoms of a stroke. He has had recent transient vision loss in both eyes, like a shade coming down, mostly in his right eye, last episode was last month. He denies dizziness other than rarely. He denies pain, tingling, numbness, weakness in either arm. He denies non healing wounds. 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  [facility], with the flu, was told that he had a mild MI at that time.  Pt Diabetic: Yes Pt smoker: former smoker, quit in 2002   Current Outpatient Prescriptions  Medication Sig Dispense Refill  . aspirin EC 81 MG tablet Take 81 mg by mouth every morning.       Marland Kitchen. atorvastatin (LIPITOR) 40 MG tablet Take 20 mg by mouth every morning.       . benazepril (LOTENSIN) 10 MG tablet Take 10 mg by mouth at bedtime.       Marland Kitchen. CARTIA XT 240 MG 24 hr capsule daily.      . carvedilol (COREG) 25 MG tablet Take 1 tablet (25 mg total) by mouth 2 (two) times daily with a meal.  60 tablet  2  . cholecalciferol (VITAMIN D) 1000 UNITS tablet Take 2,000 Units by mouth daily.       . diphenhydramine-acetaminophen (TYLENOL PM) 25-500 MG TABS Take 1 tablet by mouth at bedtime as needed. For sleep      . docusate sodium 100 MG CAPS Take 100 mg by mouth 2 (two) times daily.  10 capsule  0  . doxazosin (CARDURA)  2 MG tablet daily.      . ferrous sulfate 325 (65 FE) MG tablet Take 325 mg by mouth daily with breakfast.      . fluticasone (FLONASE) 50 MCG/ACT nasal spray Place 2 sprays into the nose daily.      . furosemide (LASIX) 20 MG tablet Take 20 mg by mouth every morning.       Marland Kitchen. glimepiride (AMARYL) 2 MG tablet Take 2 mg by mouth daily before breakfast.      . HYDROcodone-acetaminophen (NORCO/VICODIN) 5-325 MG per tablet Take 2 tablets by mouth every 6 (six) hours as needed for pain.       . hydroxypropyl methylcellulose (ISOPTO TEARS) 2.5 % ophthalmic solution Place 1 drop into both eyes daily as needed (dry eyes).       . isosorbide dinitrate (ISORDIL) 10 MG tablet Take 1 tablet (10 mg total) by mouth 3 (three) times daily.  90 tablet  2  . loratadine (CLARITIN) 10 MG tablet Take 10 mg by mouth daily.      . Multiple Vitamin (MULTIVITAMIN) capsule Take 1 capsule by mouth daily.      . niacin (SLO-NIACIN) 500 MG tablet Take 500 mg by  mouth daily.       . Omega-3 Fatty Acids (FISH OIL) 1200 MG CAPS Take 4,800 capsules by mouth every morning.      Marland Kitchen omeprazole (PRILOSEC) 20 MG capsule Take 20 mg by mouth daily.      Marland Kitchen oseltamivir (TAMIFLU) 30 MG capsule Take 1 capsule (30 mg total) by mouth 2 (two) times daily. For 3 days  6 capsule  0  . temazepam (RESTORIL) 30 MG capsule Take 30 mg by mouth at bedtime as needed. For sleep       No current facility-administered medications for this visit.    Pt meds include: Statin :Yes Betablocker: Yes ASA: Yes Other anticoagulants/antiplatelets: no  Past Medical History  Diagnosis Date  . Diabetes mellitus, type 2   . Hypertension   . GERD (gastroesophageal reflux disease)   . Osteoporosis, senile   . High cholesterol   . Seasonal allergies   . MRSA infection     s/p '02-no problems now  . Anemia   . Stroke   . Transfusion history     '02- back surgery  . PAD (peripheral artery disease)     s/p L Fem-Pop Bypass; R Iliac PTA  . Left carotid  artery occlusion     moderate R carotid disease; L Common Carotid Bypassed during Arch Repair.  . H/O thoracic aortic aneurysm repair     Stent Graft (penetrating ulcer)  . Myocardial infarction Feb. 9, 2015    Mild Heart attack  . Flu Feb. 9, 2015    ?  Mild Heart Attack    Past Surgical History  Procedure Laterality Date  . Back surgery  2001  . Cleft lip repair    . Pr vein bypass graft,aorto-fem-pop  1999  . Joint replacement  2003    Right hip   . Colonoscopy with propofol N/A 08/10/2012    Procedure: COLONOSCOPY WITH PROPOFOL;  Surgeon: Garlan Fair, MD;  Location: [facility];  Service: Endoscopy;  Laterality: N/A;  . Esophagogastroduodenoscopy (egd) with propofol N/A 08/10/2012    Procedure: ESOPHAGOGASTRODUODENOSCOPY (EGD) WITH PROPOFOL;  Surgeon: Garlan Fair, MD;  Location: [facility];  Service: Endoscopy;  Laterality: N/A;  . Iliac artery stent Right     PTA -STENT  . 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 Hemiarthroplasty hip Right   . Thoracic aortic endovascular stent graft  2003    @ [facility]  - for Penetrating Ulcer/Aneurysm.  . Vascular surgery      Social History History  Substance Use Topics  . Smoking status: Former Smoker    Quit date: 09/24/2000  . Smokeless tobacco: Never Used  . Alcohol Use: No    Family History Family History  Problem Relation Age of Onset  . Diabetes Mother   . Hypertension Mother   . Hyperlipidemia Mother   . Diabetes Sister   . Heart disease Son     Heart Disease before age 66  . Heart disease Father   . Heart attack Father   . Arthritis Father     Allergies  Allergen Reactions  . Codeine Nausea And Vomiting     REVIEW OF SYSTEMS: See HPI for pertinent positives and negatives.  Physical Examination Filed Vitals:   06/09/13 1548 06/09/13 1551   BP: 145/62 126/65  Pulse: 85  84  Resp:  16  Height:  5\' 5"  (1.651 m)  Weight:  165 lb (74.844 kg)  SpO2:  99%   Body mass index is 27.46 kg/(m^2).  General:  WDWN in NAD Gait: slow, deliberate, using rolling walker HENT: WNL Eyes: Pupils equal Pulmonary: normal non-labored breathing , without Rales, rhonchi,  wheezing Cardiac: RRR, positive murmur detected  Abdomen: soft, NT, no masses Skin: no rashes, ulcers noted;  no Gangrene , no cellulitis; no open wounds; pretibial pitting edema is 1+ right, 2+ left.  VASCULAR EXAM  Carotid Bruits Left Right   Positive, moderate Positive, harsh                             VASCULAR EXAM: Extremities without ischemic changes  without Gangrene; without open wounds.                                                                                                          LE Pulses LEFT RIGHT       POPLITEAL  not palpable   not palpable       POSTERIOR TIBIAL  not palpable   not palpable        DORSALIS PEDIS      ANTERIOR TIBIAL not palpable  faintly palpable     Musculoskeletal: no muscle wasting or atrophy; no edema  Neurologic: A&O X 3; Appropriate Affect ;  SENSATION: normal; MOTOR FUNCTION: 5/5 Symmetric, CN 2-12 intact Speech is nasal/muffled   Non-Invasive Vascular Imaging (06/09/2013):  ABI's: Right: 0.82, monophasic waveforms; Left: 1.03, triphasic waveforms Previous (12/09/2012) ABI's : Right: 0.76, Left: 1.03  CEREBROVASCULAR DUPLEX EVALUATION    INDICATION: Follow-up carotid disease     PREVIOUS INTERVENTION(S): Innominate aneurysm resection and repair 09/11/2000 Known left common carotid artery occlusion    DUPLEX EXAM:     RIGHT  LEFT  Peak Systolic Velocities (cm/s) End Diastolic Velocities (cm/s) Plaque LOCATION Peak Systolic Velocities (cm/s) End Diastolic Velocities (cm/s) Plaque  134 17  CCA PROXIMAL 0 0 Occluded  99 28  CCA MID 0 0 Occluded  106 27  CCA DISTAL 0 0 Occluded  250 39  ECA -43    294  57  ICA PROXIMAL 132 42 HT  223 61  ICA MID 75 31   116 36  ICA DISTAL 55 28     1.8 ICA / CCA Ratio (PSV) NA  Abnormal antegrade  Vertebral Flow Abnormal antegrade   761 Brachial Systolic Pressure (mmHg) 607  Within normal limits  Brachial Artery Waveforms Within normal limits     Plaque Morphology:  HM = Homogeneous, HT = Heterogeneous, CP = Calcific Plaque, SP = Smooth Plaque, IP = Irregular Plaque     ADDITIONAL FINDINGS: Right subclavian artery velocity= 267cm/second Left subclavian artery velocity= 255cm/second                  IMPRESSION: 1. Evidence of 40%-59% stenosis of the right internal carotid artery. 2. Unable to grade disease of the left  internal carotid artery; common carotid artery is occluded with plaque that extends slightly into the internal carotid artery which is fed via a retrograde external carotid artery. 3. Bilateral vertebral artery is antegrade; however, the right is dampened and the left is elevated.  4. Bilateral subclavian artery appears stenotic with a brachial blood pressure gradient observed.     ASSESSMENT:  CHRISTOVAL HECKMANN is a 80 y.o. male who is s/p left fem-pop BPG 02/13/1997 with revision in 1999,  and right external iliac artery PTA in 1997.  He is also s/p innominate artery aneurysm resection and repair by Dr. Madilyn Fireman in 2002. Normal ABI in left leg, and right leg ABI indicates mild arterial occlusive disease, slightly improved from a year ago. Evidence of 40%-59% stenosis of the right internal carotid artery. Unable to grade disease of the left internal carotid artery; common carotid artery is occluded with plaque that extends slightly into the internal carotid artery which is fed via a retrograde external carotid artery. Bilateral vertebral artery is antegrade; however, the right is dampened and the left is elevated.  Bilateral subclavian artery appears stenotic with a brachial blood pressure gradient observed.   PLAN:   Based on  today's exam and non-invasive vascular lab results, and after discussing with Dr. Edilia Bo,  the patient will be scheduled for cerebral arteriogram with Dr. Edilia Bo on Monday, 06/13/2013. I discussed in depth with the patient the nature of atherosclerosis, and emphasized the importance of maximal medical management including strict control of blood pressure, blood glucose, and lipid levels, obtaining regular exercise, and continued cessation of smoking.  The patient is aware that without maximal medical management the underlying atherosclerotic disease process will progress, limiting the benefit of any interventions. The patient was given information about stroke prevention and what symptoms should prompt the patient to seek immediate medical care.  The patient was given information about PAD including signs, symptoms, treatment, what symptoms should prompt the patient to seek immediate medical care, and risk reduction measures to take. Thank you for allowing [place] to participate in this patient's care.  Charisse March, RN, MSN, FNP-C [facility] Office: [phone]  Clinic MD: Edilia Bo on call 06/09/2013 4:22 PM

## 2013-06-10 ENCOUNTER — Encounter (HOSPITAL_COMMUNITY): Payer: Self-pay | Admitting: Pharmacy Technician

## 2013-06-13 ENCOUNTER — Emergency Department (HOSPITAL_COMMUNITY): Payer: Medicare Other

## 2013-06-13 ENCOUNTER — Encounter (HOSPITAL_COMMUNITY): Payer: Self-pay | Admitting: Emergency Medicine

## 2013-06-13 ENCOUNTER — Inpatient Hospital Stay (HOSPITAL_COMMUNITY)
Admission: EM | Admit: 2013-06-13 | Discharge: 2013-06-16 | DRG: 291 | Disposition: A | Discharge status: Home Health | Payer: Medicare Other | Attending: Internal Medicine | Admitting: Internal Medicine

## 2013-06-13 ENCOUNTER — Other Ambulatory Visit: Payer: Self-pay

## 2013-06-13 ENCOUNTER — Encounter (HOSPITAL_COMMUNITY)
Admission: EM | Disposition: A | Discharge status: Home Health | Payer: Self-pay | Source: Home / Self Care | Attending: Internal Medicine

## 2013-06-13 ENCOUNTER — Ambulatory Visit (HOSPITAL_COMMUNITY): Admission: RE | Admit: 2013-06-13 | Payer: Medicare Other | Source: Ambulatory Visit | Admitting: Vascular Surgery

## 2013-06-13 DIAGNOSIS — Z8614 Personal history of Methicillin resistant Staphylococcus aureus infection: Secondary | ICD-10-CM

## 2013-06-13 DIAGNOSIS — D500 Iron deficiency anemia secondary to blood loss (chronic): Secondary | ICD-10-CM | POA: Diagnosis present

## 2013-06-13 DIAGNOSIS — M810 Age-related osteoporosis without current pathological fracture: Secondary | ICD-10-CM | POA: Diagnosis present

## 2013-06-13 DIAGNOSIS — I5023 Acute on chronic systolic (congestive) heart failure: Principal | ICD-10-CM | POA: Diagnosis present

## 2013-06-13 DIAGNOSIS — E875 Hyperkalemia: Secondary | ICD-10-CM | POA: Diagnosis present

## 2013-06-13 DIAGNOSIS — R05 Cough: Secondary | ICD-10-CM

## 2013-06-13 DIAGNOSIS — I10 Essential (primary) hypertension: Secondary | ICD-10-CM | POA: Diagnosis present

## 2013-06-13 DIAGNOSIS — I509 Heart failure, unspecified: Secondary | ICD-10-CM | POA: Diagnosis present

## 2013-06-13 DIAGNOSIS — I252 Old myocardial infarction: Secondary | ICD-10-CM

## 2013-06-13 DIAGNOSIS — E1169 Type 2 diabetes mellitus with other specified complication: Secondary | ICD-10-CM | POA: Diagnosis present

## 2013-06-13 DIAGNOSIS — I6529 Occlusion and stenosis of unspecified carotid artery: Secondary | ICD-10-CM | POA: Diagnosis present

## 2013-06-13 DIAGNOSIS — L723 Sebaceous cyst: Secondary | ICD-10-CM

## 2013-06-13 DIAGNOSIS — J811 Chronic pulmonary edema: Secondary | ICD-10-CM

## 2013-06-13 DIAGNOSIS — D696 Thrombocytopenia, unspecified: Secondary | ICD-10-CM | POA: Diagnosis present

## 2013-06-13 DIAGNOSIS — Z8249 Family history of ischemic heart disease and other diseases of the circulatory system: Secondary | ICD-10-CM

## 2013-06-13 DIAGNOSIS — I2510 Atherosclerotic heart disease of native coronary artery without angina pectoris: Secondary | ICD-10-CM | POA: Diagnosis present

## 2013-06-13 DIAGNOSIS — M7989 Other specified soft tissue disorders: Secondary | ICD-10-CM

## 2013-06-13 DIAGNOSIS — J9601 Acute respiratory failure with hypoxia: Secondary | ICD-10-CM | POA: Diagnosis present

## 2013-06-13 DIAGNOSIS — E860 Dehydration: Secondary | ICD-10-CM

## 2013-06-13 DIAGNOSIS — I214 Non-ST elevation (NSTEMI) myocardial infarction: Secondary | ICD-10-CM

## 2013-06-13 DIAGNOSIS — Z8673 Personal history of transient ischemic attack (TIA), and cerebral infarction without residual deficits: Secondary | ICD-10-CM

## 2013-06-13 DIAGNOSIS — N390 Urinary tract infection, site not specified: Secondary | ICD-10-CM | POA: Diagnosis present

## 2013-06-13 DIAGNOSIS — E119 Type 2 diabetes mellitus without complications: Secondary | ICD-10-CM

## 2013-06-13 DIAGNOSIS — Z87891 Personal history of nicotine dependence: Secondary | ICD-10-CM

## 2013-06-13 DIAGNOSIS — L089 Local infection of the skin and subcutaneous tissue, unspecified: Secondary | ICD-10-CM

## 2013-06-13 DIAGNOSIS — Z66 Do not resuscitate: Secondary | ICD-10-CM | POA: Diagnosis present

## 2013-06-13 DIAGNOSIS — R058 Other specified cough: Secondary | ICD-10-CM

## 2013-06-13 DIAGNOSIS — N179 Acute kidney failure, unspecified: Secondary | ICD-10-CM | POA: Diagnosis not present

## 2013-06-13 DIAGNOSIS — J9600 Acute respiratory failure, unspecified whether with hypoxia or hypercapnia: Secondary | ICD-10-CM | POA: Diagnosis present

## 2013-06-13 DIAGNOSIS — E7800 Pure hypercholesterolemia, unspecified: Secondary | ICD-10-CM | POA: Diagnosis present

## 2013-06-13 DIAGNOSIS — R918 Other nonspecific abnormal finding of lung field: Secondary | ICD-10-CM

## 2013-06-13 DIAGNOSIS — I739 Peripheral vascular disease, unspecified: Secondary | ICD-10-CM | POA: Diagnosis present

## 2013-06-13 DIAGNOSIS — Z96649 Presence of unspecified artificial hip joint: Secondary | ICD-10-CM

## 2013-06-13 DIAGNOSIS — R509 Fever, unspecified: Secondary | ICD-10-CM

## 2013-06-13 DIAGNOSIS — K219 Gastro-esophageal reflux disease without esophagitis: Secondary | ICD-10-CM | POA: Diagnosis present

## 2013-06-13 DIAGNOSIS — Z7982 Long term (current) use of aspirin: Secondary | ICD-10-CM

## 2013-06-13 DIAGNOSIS — J101 Influenza due to other identified influenza virus with other respiratory manifestations: Secondary | ICD-10-CM

## 2013-06-13 LAB — CBC WITH DIFFERENTIAL/PLATELET
BASOS PCT: 1 % (ref 0–1)
Basophils Absolute: 0 10*3/uL (ref 0.0–0.1)
EOS ABS: 0.3 10*3/uL (ref 0.0–0.7)
Eosinophils Relative: 4 % (ref 0–5)
HCT: 35.9 % — ABNORMAL LOW (ref 39.0–52.0)
Hemoglobin: 12.2 g/dL — ABNORMAL LOW (ref 13.0–17.0)
Lymphocytes Relative: 32 % (ref 12–46)
Lymphs Abs: 2.5 10*3/uL (ref 0.7–4.0)
MCH: 32.9 pg (ref 26.0–34.0)
MCHC: 34 g/dL (ref 30.0–36.0)
MCV: 96.8 fL (ref 78.0–100.0)
MONOS PCT: 8 % (ref 3–12)
Monocytes Absolute: 0.7 10*3/uL (ref 0.1–1.0)
NEUTROS PCT: 56 % (ref 43–77)
Neutro Abs: 4.4 10*3/uL (ref 1.7–7.7)
PLATELETS: 127 10*3/uL — AB (ref 150–400)
RBC: 3.71 MIL/uL — AB (ref 4.22–5.81)
RDW: 12.9 % (ref 11.5–15.5)
WBC: 7.9 10*3/uL (ref 4.0–10.5)

## 2013-06-13 LAB — BASIC METABOLIC PANEL
BUN: 25 mg/dL — AB (ref 6–23)
CHLORIDE: 101 meq/L (ref 96–112)
CO2: 23 meq/L (ref 19–32)
Calcium: 9.3 mg/dL (ref 8.4–10.5)
Creatinine, Ser: 1.09 mg/dL (ref 0.50–1.35)
GFR calc Af Amer: 72 mL/min — ABNORMAL LOW (ref >90)
GFR calc non Af Amer: 62 mL/min — ABNORMAL LOW (ref >90)
Glucose, Bld: 186 mg/dL — ABNORMAL HIGH (ref 70–99)
POTASSIUM: 4.3 meq/L (ref 3.7–5.3)
Sodium: 139 mEq/L (ref 137–147)

## 2013-06-13 LAB — COMPREHENSIVE METABOLIC PANEL
ALT: 12 U/L (ref 0–53)
AST: 21 U/L (ref 0–37)
Albumin: 3.6 g/dL (ref 3.5–5.2)
Alkaline Phosphatase: 90 U/L (ref 39–117)
BUN: 23 mg/dL (ref 6–23)
CALCIUM: 9.1 mg/dL (ref 8.4–10.5)
CO2: 21 mEq/L (ref 19–32)
Chloride: 107 mEq/L (ref 96–112)
Creatinine, Ser: 1.11 mg/dL (ref 0.50–1.35)
GFR calc non Af Amer: 61 mL/min — ABNORMAL LOW (ref >90)
GFR, EST AFRICAN AMERICAN: 70 mL/min — AB (ref >90)
GLUCOSE: 191 mg/dL — AB (ref 70–99)
Potassium: 4.3 mEq/L (ref 3.7–5.3)
Sodium: 143 mEq/L (ref 137–147)
TOTAL PROTEIN: 6.8 g/dL (ref 6.0–8.3)
Total Bilirubin: <0.2 mg/dL — ABNORMAL LOW (ref 0.3–1.2)

## 2013-06-13 LAB — URINALYSIS, ROUTINE W REFLEX MICROSCOPIC
Bilirubin Urine: NEGATIVE
Glucose, UA: NEGATIVE mg/dL
Hgb urine dipstick: NEGATIVE
KETONES UR: NEGATIVE mg/dL
Leukocytes, UA: NEGATIVE
Nitrite: NEGATIVE
PROTEIN: 30 mg/dL — AB
Specific Gravity, Urine: 1.014 (ref 1.005–1.030)
Urobilinogen, UA: 0.2 mg/dL (ref 0.0–1.0)
pH: 5 (ref 5.0–8.0)

## 2013-06-13 LAB — TROPONIN I
TROPONIN I: 0.58 ng/mL — AB (ref <0.30)
TROPONIN I: 1.53 ng/mL — AB (ref <0.30)
TROPONIN I: 1.46 ng/mL — AB (ref <0.30)

## 2013-06-13 LAB — I-STAT TROPONIN, ED: Troponin i, poc: 0.02 ng/mL (ref 0.00–0.08)

## 2013-06-13 LAB — GLUCOSE, CAPILLARY
GLUCOSE-CAPILLARY: 182 mg/dL — AB (ref 70–99)
Glucose-Capillary: 139 mg/dL — ABNORMAL HIGH (ref 70–99)
Glucose-Capillary: 171 mg/dL — ABNORMAL HIGH (ref 70–99)
Glucose-Capillary: 191 mg/dL — ABNORMAL HIGH (ref 70–99)

## 2013-06-13 LAB — TSH: TSH: 2.664 u[IU]/mL (ref 0.350–4.500)

## 2013-06-13 LAB — URINE MICROSCOPIC-ADD ON

## 2013-06-13 LAB — MRSA PCR SCREENING: MRSA BY PCR: NEGATIVE

## 2013-06-13 LAB — PRO B NATRIURETIC PEPTIDE: PRO B NATRI PEPTIDE: 3921 pg/mL — AB (ref 0–450)

## 2013-06-13 SURGERY — CAROTID ANGIOGRAM
Anesthesia: LOCAL

## 2013-06-13 MED ORDER — ONDANSETRON HCL 4 MG/2ML IJ SOLN: 4.0000 mg | Freq: Four times a day (QID) | INTRAMUSCULAR | Status: DC | PRN

## 2013-06-13 MED ORDER — FUROSEMIDE 10 MG/ML IJ SOLN
40.0000 mg | Freq: Three times a day (TID) | INTRAMUSCULAR | Status: DC
  Administered 2013-06-13 – 2013-06-14 (×3): 40 mg via INTRAVENOUS
  Filled 2013-06-13 (×6): qty 4

## 2013-06-13 MED ORDER — ISOSORBIDE DINITRATE 20 MG PO TABS
20.0000 mg | ORAL_TABLET | Freq: Three times a day (TID) | ORAL | Status: DC
Start: 2013-06-13 — End: 2013-06-16
  Administered 2013-06-13 – 2013-06-16 (×9): 20 mg via ORAL
  Filled 2013-06-13 (×13): qty 1

## 2013-06-13 MED ORDER — FUROSEMIDE 10 MG/ML IJ SOLN
60.0000 mg | Freq: Once | INTRAMUSCULAR | Status: AC
  Administered 2013-06-13: 60 mg via INTRAVENOUS
  Filled 2013-06-13: qty 6

## 2013-06-13 MED ORDER — DILTIAZEM HCL ER COATED BEADS 240 MG PO CP24
240.0000 mg | ORAL_CAPSULE | Freq: Every day | ORAL | Status: DC
  Administered 2013-06-13 – 2013-06-16 (×4): 240 mg via ORAL
  Filled 2013-06-13 (×4): qty 1

## 2013-06-13 MED ORDER — FLUTICASONE PROPIONATE 50 MCG/ACT NA SUSP
2.0000 | Freq: Every day | NASAL | Status: DC
  Administered 2013-06-13 – 2013-06-16 (×4): 2 via NASAL
  Filled 2013-06-13: qty 16

## 2013-06-13 MED ORDER — DOXAZOSIN MESYLATE 2 MG PO TABS
2.0000 mg | ORAL_TABLET | Freq: Every day | ORAL | Status: DC
  Administered 2013-06-13 – 2013-06-16 (×4): 2 mg via ORAL
  Filled 2013-06-13 (×4): qty 1

## 2013-06-13 MED ORDER — HYDROCODONE-ACETAMINOPHEN 5-325 MG PO TABS
2.0000 | ORAL_TABLET | Freq: Four times a day (QID) | ORAL | Status: DC | PRN
Start: 2013-06-13 — End: 2013-06-16
  Filled 2013-06-13: qty 2

## 2013-06-13 MED ORDER — INSULIN ASPART 100 UNIT/ML ~~LOC~~ SOLN
0.0000 [IU] | Freq: Three times a day (TID) | SUBCUTANEOUS | Status: DC
  Administered 2013-06-13: 2 [IU] via SUBCUTANEOUS
  Administered 2013-06-13 – 2013-06-14 (×3): 3 [IU] via SUBCUTANEOUS
  Administered 2013-06-14 (×2): 2 [IU] via SUBCUTANEOUS
  Administered 2013-06-15: 3 [IU] via SUBCUTANEOUS
  Administered 2013-06-15: 2 [IU] via SUBCUTANEOUS
  Administered 2013-06-15: 3 [IU] via SUBCUTANEOUS
  Administered 2013-06-16: 2 [IU] via SUBCUTANEOUS
  Administered 2013-06-16: 3 [IU] via SUBCUTANEOUS

## 2013-06-13 MED ORDER — DOCUSATE SODIUM 100 MG PO CAPS
200.0000 mg | ORAL_CAPSULE | Freq: Every day | ORAL | Status: DC
  Administered 2013-06-13 – 2013-06-15 (×3): 200 mg via ORAL
  Filled 2013-06-13 (×4): qty 2

## 2013-06-13 MED ORDER — TEMAZEPAM 15 MG PO CAPS
30.0000 mg | ORAL_CAPSULE | Freq: Every evening | ORAL | Status: DC | PRN
  Administered 2013-06-13 – 2013-06-15 (×2): 30 mg via ORAL
  Filled 2013-06-13 (×2): qty 2

## 2013-06-13 MED ORDER — HEPARIN SODIUM (PORCINE) 5000 UNIT/ML IJ SOLN
5000.0000 [IU] | Freq: Three times a day (TID) | INTRAMUSCULAR | Status: DC
  Administered 2013-06-13 – 2013-06-16 (×9): 5000 [IU] via SUBCUTANEOUS
  Filled 2013-06-13 (×13): qty 1

## 2013-06-13 MED ORDER — ATORVASTATIN CALCIUM 20 MG PO TABS
20.0000 mg | ORAL_TABLET | Freq: Every morning | ORAL | Status: DC
  Administered 2013-06-13 – 2013-06-16 (×4): 20 mg via ORAL
  Filled 2013-06-13 (×4): qty 1

## 2013-06-13 MED ORDER — ASPIRIN EC 81 MG PO TBEC
81.0000 mg | DELAYED_RELEASE_TABLET | Freq: Every morning | ORAL | Status: DC
  Administered 2013-06-13 – 2013-06-16 (×4): 81 mg via ORAL
  Filled 2013-06-13 (×4): qty 1

## 2013-06-13 MED ORDER — CARVEDILOL 25 MG PO TABS
25.0000 mg | ORAL_TABLET | Freq: Two times a day (BID) | ORAL | Status: DC
  Administered 2013-06-13: 25 mg via ORAL
  Filled 2013-06-13 (×3): qty 1

## 2013-06-13 MED ORDER — SALINE SPRAY 0.65 % NA SOLN
1.0000 | NASAL | Status: DC | PRN
  Filled 2013-06-13: qty 44

## 2013-06-13 MED ORDER — ONDANSETRON HCL 4 MG PO TABS: 4.0000 mg | ORAL_TABLET | Freq: Four times a day (QID) | ORAL | Status: DC | PRN

## 2013-06-13 MED ORDER — NITROGLYCERIN 2 % TD OINT
1.0000 [in_us] | TOPICAL_OINTMENT | Freq: Once | TRANSDERMAL | Status: AC
  Administered 2013-06-13: 1 [in_us] via TOPICAL
  Filled 2013-06-13: qty 1

## 2013-06-13 MED ORDER — LORATADINE 10 MG PO TABS
10.0000 mg | ORAL_TABLET | Freq: Every day | ORAL | Status: DC
  Administered 2013-06-13 – 2013-06-16 (×4): 10 mg via ORAL
  Filled 2013-06-13 (×4): qty 1

## 2013-06-13 MED ORDER — INSULIN ASPART 100 UNIT/ML ~~LOC~~ SOLN: 0.0000 [IU] | Freq: Every day | SUBCUTANEOUS | Status: DC

## 2013-06-13 MED ORDER — PANTOPRAZOLE SODIUM 40 MG PO TBEC
40.0000 mg | DELAYED_RELEASE_TABLET | Freq: Every day | ORAL | Status: DC
  Administered 2013-06-13 – 2013-06-16 (×4): 40 mg via ORAL
  Filled 2013-06-13 (×3): qty 1

## 2013-06-13 MED ORDER — SODIUM CHLORIDE 0.9 % IJ SOLN
3.0000 mL | Freq: Two times a day (BID) | INTRAMUSCULAR | Status: DC
  Administered 2013-06-13 – 2013-06-14 (×3): 3 mL via INTRAVENOUS
  Administered 2013-06-14: 10:00:00 via INTRAVENOUS
  Administered 2013-06-15 (×2): 3 mL via INTRAVENOUS

## 2013-06-13 NOTE — ED Notes (Signed)
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

## 2013-06-13 NOTE — Progress Notes (Signed)
Echocardiogram 2D Echocardiogram has been performed.  06/13/2013 12:26 PM Maudry Mayhew, RVT, RDCS, RDMS

## 2013-06-13 NOTE — ED Notes (Signed)
Attempted report 

## 2013-06-13 NOTE — ED Notes (Signed)
Weaned from Bipap to O2 6L [place], tolerating, will continue to assess.

## 2013-06-13 NOTE — Progress Notes (Signed)
[facility]  [name] ZDG:[id number] DOB: 1930/04/08 DOA: 06/13/2013 PCP: [name], MD  Brief narrative: 80 year old male patient known to this service from previous admission. Return to the emergency department because of shortness of breath. During last admission was diagnosed with mild chronic systolic heart failure and was discharged on ACE inhibitor and Lasix. Within 3 days of being discharged patient was noted with progressive renal insufficiency and hyperkalemia and his primary care physician discontinued both of these medications. According to the son they do not monitor the patient's weight regularly but they have noticed that his legs have been swelling right greater than left over the past few weeks after discharge.  Current symptoms began after patient awakened from nap with sudden shortness of breath and some mild chest tightness and was having difficulty talking due to shortness of breath.  Patient was also scheduled for outpatient carotid angiogram for 06/13/2013 to evaluate carotid stenosis detected during last admission.  Assessment/Plan: Active Problems:   Acute respiratory failure with hypoxia/Acute decompensated heart failure -suspect due to recent cessation of ACE I and Lasix (done 3 days after dc) -increase Lasix IV to TID and follow lytes -cont supportive care -wean O2 as tolerates - out ~ 1500 cc since admit -was not doing daily weights at home- ? Needs diet instrudtion -cont Isordil for after load reduction    Hypertension -BP controlled on home meds -a little tachycardia but given acute CHF will hold home BB but continue CCB-   Diabetes mellitus, type 2 -not on meds pre admit -check HgbA1c    Thrombocytopenia -new- ? UTI- check UA/cx    Occlusion and stenosis of carotid artery without mention of cerebral infarction    PAD (peripheral artery disease)   DVT prophylaxis: SQ Heparin Code Status: DO NOT  RESUSCITATE Family Communication: Son at the bedside Disposition Plan/Expected LOS: Stepdown   Consultants: None  Procedures: Bilateral lower extremity venous duplex Preliminary report negative for DVT  2-D echocardiogram pending  Antibiotics: None  HPI/Subjective: Patient alert and seems oriented. Currently denying chest pain or shortness of breath. Has not been out of bed.  Objective: Blood pressure 151/81, pulse 112, temperature 97.5 F (36.4 C), temperature source Oral, resp. rate 23, height 5\' 5"  (1.651 m), weight 165 lb (74.844 kg), SpO2 94.00%.  Intake/Output Summary (Last 24 hours) at 06/13/13 1107 Last data filed at 06/13/13 0800  Gross per 24 hour  Intake      0 ml  Output   1425 ml  Net  -1425 ml     Exam: Patient admitted at 6:28 AM today. Followup exam completed  Scheduled Meds:  Scheduled Meds: . aspirin EC  81 mg Oral q morning - 10a  . atorvastatin  20 mg Oral q morning - 10a  . diltiazem  240 mg Oral Daily  . doxazosin  2 mg Oral Daily  . fluticasone  2 spray Each Nare Daily  . furosemide  40 mg Intravenous 3 times per day  . heparin  5,000 Units Subcutaneous 3 times per day  . insulin aspart  0-15 Units Subcutaneous TID WC  . insulin aspart  0-5 Units Subcutaneous QHS  . isosorbide dinitrate  20 mg Oral TID  . loratadine  10 mg Oral Daily  . pantoprazole  40 mg Oral Daily  . sodium chloride  3 mL Intravenous Q12H   Continuous Infusions:   Data Reviewed: Basic Metabolic Panel:  Recent Labs Lab 06/13/13  0404  NA 143  K 4.3  CL 107  CO2 21  GLUCOSE 191*  BUN 23  CREATININE 1.11  CALCIUM 9.1   Liver Function Tests:  Recent Labs Lab 06/13/13 0404  AST 21  ALT 12  ALKPHOS 90  BILITOT <0.2*  PROT 6.8  ALBUMIN 3.6   No results found for this basename: LIPASE, AMYLASE,  in the last 168 hours No results found for this basename: AMMONIA,  in the last 168 hours CBC:  Recent Labs Lab 06/13/13 0404  WBC 7.9  NEUTROABS 4.4    HGB 12.2*  HCT 35.9*  MCV 96.8  PLT 127*   Cardiac Enzymes:  Recent Labs Lab 06/13/13 0637  TROPONINI 0.58*   BNP (last 3 results)  Recent Labs  05/10/13 1855 06/13/13 0409  PROBNP 18246.0* 3921.0*   CBG:  Recent Labs Lab 06/13/13 0751  GLUCAP 139*    Recent Results (from the past 240 hour(s))  MRSA PCR SCREENING     Status: None   Collection Time    06/13/13  7:39 AM      Result Value Ref Range Status   MRSA by PCR NEGATIVE  NEGATIVE Final   Comment:            The GeneXpert MRSA Assay (FDA     approved for NASAL specimens     only), is one component of a     comprehensive MRSA colonization     surveillance program. It is not     intended to diagnose MRSA     infection nor to guide or     monitor treatment for     MRSA infections.     Studies:  Recent x-ray studies have been reviewed in detail by the Attending Physician  Time spent :     Erin Hearing, [place] [facility] Office  [phone] Pager [phone]  **If unable to reach the above provider after paging please contact the [place] @ [phone]  On-Call/Text Page:      [email or web address]      password [email or web address]  If 7PM-7AM, please contact night-coverage [email or web address] Password [email or web address] 06/13/2013, 11:07 AM   LOS: 0 days   I have examined the patient, reviewed the chart and modified the above note which I agree with.   Verdis Koval,MD Pager # on [email or web address] 06/13/2013, 3:17 PM

## 2013-06-13 NOTE — ED Notes (Signed)
Pt here by EMS, here for sudden onset of respiritory distress, got up to go to the b/r and developed sob. Onset ~ 0330. "Have been watching poor kidney function. Recently taken off lasix". Found with SPO2 in 80s and diaphoretic. LS rales & rhonchi. LS improved with CPAP, skin drier with CPAP, "feels better". IV established PTA.

## 2013-06-13 NOTE — ED Notes (Signed)
Dr. Posey Pronto admitting MD in to see pt, at [facility]

## 2013-06-13 NOTE — Progress Notes (Signed)
VASCULAR LAB PRELIMINARY  PRELIMINARY  PRELIMINARY  PRELIMINARY  Bilateral lower extremity venous duplex  completed.    Preliminary report:  Bilateral:  No evidence of DVT, superficial thrombosis, or Baker's Cyst.    Mykayla Brinton, RVT 06/13/2013, 11:43 AM

## 2013-06-13 NOTE — H&P (Signed)
[facility] History and Physical  Patient: [name]  CVE:[id number]  DOB: [birth year]  DOS: the patient was seen and examined on 06/13/2013 PCP: [name], MD  Chief Complaint: Shortness of breath  HPI: [name] is a 80 y.o. male with Past medical history of diabetes mellitus, hypertension, GERD, recent admission for influenza pneumonia, peripheral vascular disease, coronary artery disease, history of aneurysm repair, recent admission for acute on chronic systolic heart failure. The patient is coming from home. The patient was brought and as he started having complaints of shortness of breath. He mentions that he was on Lasix at the time of the discharge from the hospital a month ago but due to is progressively worsening renal function as well as hyperkalemia he was taken off of his Lasix as well as lisinopril. He has not been watching his weight on a regular basis but mentions that that has been some swelling of his legs right more than left since last few weeks. He denies any orthopnea PND chest pain, fever, cough, nausea, vomiting, choking, diarrhea, burning urination prior to today. When he went to sleep today he suddenly started having shortness of breath this was associated with mild substernal chest tightness and he couldn't tak any bread therefore he came to the hospital. The chest tightness has completely resolved at present. He mention he is compliant with his medication and there is no recent change in his medication other than taking off of Lasix and lisinopril. He was seen by a vascular surgeon a few days ago and was scheduled for carotid angiogram today. He denies change in his diet.  Review of Systems: as mentioned in the history of present illness.  A Comprehensive review of the other systems is negative.  Past Medical History  Diagnosis Date  . Diabetes mellitus, type 2   . Hypertension   . GERD (gastroesophageal reflux disease)   . Osteoporosis,  senile   . High cholesterol   . Seasonal allergies   . MRSA infection     s/p '02-no problems now  . Anemia   . Stroke   . Transfusion history     '02- back surgery  . PAD (peripheral artery disease)     s/p L Fem-Pop Bypass; R Iliac PTA  . Left carotid artery occlusion     moderate R carotid disease; L Common Carotid Bypassed during Arch Repair.  . H/O thoracic aortic aneurysm repair     Stent Graft (penetrating ulcer)  . Myocardial infarction Feb. 9, 2015    Mild Heart attack  . Flu Feb. 9, 2015    ?  Mild Heart Attack   Past Surgical History  Procedure Laterality Date  . Back surgery  2001  . Cleft lip repair    . Pr vein bypass graft,aorto-fem-pop  1999  . Joint replacement  2003    Right hip   . Colonoscopy with propofol N/A 08/10/2012    Procedure: COLONOSCOPY WITH PROPOFOL;  Surgeon: Garlan Fair, MD;  Location: [facility];  Service: Endoscopy;  Laterality: N/A;  . Esophagogastroduodenoscopy (egd) with propofol N/A 08/10/2012    Procedure: ESOPHAGOGASTRODUODENOSCOPY (EGD) WITH PROPOFOL;  Surgeon: Garlan Fair, MD;  Location: [facility];  Service: Endoscopy;  Laterality: N/A;  . Iliac artery stent Right     PTA -STENT  . Aortic arch repair - with r innominate artery resection; bypass to distal r innominate & l common carotid  07/2000    For Mycotic Aneurysm of R Innominate A  with occluded Innominate & L Common Carotid  . Hemiarthroplasty hip Right   . Thoracic aortic endovascular stent graft  2003    @ [facility]  - for Penetrating Ulcer/Aneurysm.  . Vascular surgery     Social History:  reports that he quit smoking about 12 years ago. He has never used smokeless tobacco. He reports that he does not drink alcohol or use illicit drugs. Walking with a walker, Independent for most of his  ADL.  Allergies  Allergen Reactions  . Codeine Nausea And Vomiting    Family History  Problem Relation Age of Onset  . Diabetes Mother   . Hypertension Mother   .  Hyperlipidemia Mother   . Diabetes Sister   . Heart disease Son     Heart Disease before age 59  . Heart disease Father   . Heart attack Father   . Arthritis Father     Prior to Admission medications   Medication Sig Start Date End Date Taking? Authorizing Provider  aspirin EC 81 MG tablet Take 81 mg by mouth every morning.    Yes Historical Provider, MD  atorvastatin (LIPITOR) 40 MG tablet Take 20 mg by mouth every morning.    Yes Historical Provider, MD  carvedilol (COREG) 25 MG tablet Take 1 tablet (25 mg total) by mouth 2 (two) times daily with a meal. 05/16/13  Yes Erline Hau, MD  Cholecalciferol (VITAMIN D) 2000 UNITS tablet Take 2,000 Units by mouth daily.   Yes Historical Provider, MD  diltiazem (CARTIA XT) 240 MG 24 hr capsule Take 240 mg by mouth daily.   Yes Historical Provider, MD  doxazosin (CARDURA) 2 MG tablet Take 2 mg by mouth daily.  06/04/13  Yes Historical Provider, MD  Ferrous Gluconate (IRON) 246 (28 FE) MG TABS Take 2 tablets by mouth daily.   Yes Historical Provider, MD  fluticasone (FLONASE) 50 MCG/ACT nasal spray Place 2 sprays into the nose daily.   Yes Historical Provider, MD  glimepiride (AMARYL) 2 MG tablet Take 2 mg by mouth daily before breakfast.   Yes Historical Provider, MD  HYDROcodone-acetaminophen (NORCO/VICODIN) 5-325 MG per tablet Take 2 tablets by mouth every 6 (six) hours as needed for pain.    Yes Historical Provider, MD  isosorbide dinitrate (ISORDIL) 10 MG tablet Take 1 tablet (10 mg total) by mouth 3 (three) times daily. 05/16/13  Yes Erline Hau, MD  loratadine (CLARITIN) 10 MG tablet Take 10 mg by mouth daily.   Yes Historical Provider, MD  Multiple Vitamin (MULTIVITAMIN) capsule Take 1 capsule by mouth daily.   Yes Historical Provider, MD  niacin (SLO-NIACIN) 500 MG tablet Take 500 mg by mouth daily.    Yes Historical Provider, MD  omega-3 acid ethyl esters (LOVAZA) 1 G capsule Take 2 g by mouth daily.   Yes Historical  Provider, MD  omeprazole (PRILOSEC) 20 MG capsule Take 20 mg by mouth daily.   Yes Historical Provider, MD  temazepam (RESTORIL) 30 MG capsule Take 30 mg by mouth at bedtime as needed for sleep. For sleep   Yes Historical Provider, MD    Physical Exam: Filed Vitals:   06/13/13 0545 06/13/13 0549 06/13/13 0600 06/13/13 0615  BP: 148/85  162/72 166/87  Pulse: 106  111 113  Temp:      TempSrc:      Resp: 24  27 25   SpO2: 100% 98% 90% 92%    General: Alert, Awake and Oriented to Time, Place and  Person. Appear in moderate distress Eyes: PERRL ENT: Oral Mucosa clear moist.no sinus tenderness  Neck: Difficult to assess  JVD Cardiovascular: S1 and S2 Present, no  Murmur, Peripheral Pulses Present Respiratory: Bilateral Air entry equal and Decreased, bilateral  Crackles,no  wheezes Abdomen: Bowel Sound Present, Soft and Non tender Skin: No  Rash Extremities: Right more than left  Pedal edema, no  calf tenderness Neurologic: Grossly Unremarkable.  Labs on Admission:  CBC:  Recent Labs Lab 06/13/13 0404  WBC 7.9  NEUTROABS 4.4  HGB 12.2*  HCT 35.9*  MCV 96.8  PLT 127*    CMP     Component Value Date/Time   NA 143 06/13/2013 0404   K 4.3 06/13/2013 0404   CL 107 06/13/2013 0404   CO2 21 06/13/2013 0404   GLUCOSE 191* 06/13/2013 0404   BUN 23 06/13/2013 0404   CREATININE 1.11 06/13/2013 0404   CALCIUM 9.1 06/13/2013 0404   PROT 6.8 06/13/2013 0404   ALBUMIN 3.6 06/13/2013 0404   AST 21 06/13/2013 0404   ALT 12 06/13/2013 0404   ALKPHOS 90 06/13/2013 0404   BILITOT <0.2* 06/13/2013 0404   GFRNONAA 61* 06/13/2013 0404   GFRAA 70* 06/13/2013 0404    No results found for this basename: LIPASE, AMYLASE,  in the last 168 hours No results found for this basename: AMMONIA,  in the last 168 hours  No results found for this basename: CKTOTAL, CKMB, CKMBINDEX, TROPONINI,  in the last 168 hours BNP (last 3 results)  Recent Labs  05/10/13 1855 06/13/13 0409  PROBNP 18246.0* 3921.0*     Radiological Exams on Admission: Dg Chest Portable 1 View  06/13/2013   CLINICAL DATA:  Respiratory distress.  EXAM: PORTABLE CHEST - 1 VIEW  COMPARISON:  Single view of the chest 05/12/2013.  FINDINGS: Airspace disease is markedly worsened. New, extensive airspace opacity is seen in the mid and lower lung zones bilaterally. Airspace opacity in the right upper lobe persists. Heart size is normal. Aortic stent graft is noted. Severe degenerative change right shoulder is seen.  IMPRESSION: Marked worsening of bilateral airspace disease could be due to progressive pneumonia or edema.   Electronically Signed   By: Inge Rise M.D.   On: 06/13/2013 04:20    EKG: Independently reviewed. nonspecific ST and T waves changes, sinus tachycardia.  Assessment/Plan Active Problems:   Occlusion and stenosis of carotid artery without mention of cerebral infarction   Hypertension   PAD (peripheral artery disease)   Diabetes mellitus, type 2   Acute respiratory failure with hypoxia   Acute decompensated heart failure   1. Acute decompensated heart failure The patient is presenting with complaints of sudden onset of shortness of breath. His chest x-rays showing significant vascular congestion. His anti-proBNP is significantly elevated. He is on arrival blood pressure was also elevated along with tachycardia. This suggest acute decompensated heart failure most likely combined systolic and diastolic. Patient has received 60 mg of IV Lasix and nitroglycerin paste and with that he has significantly improved from his arrival. At present I would admit him to the step down unit. I would continue him on 60 mg Lasix daily. In the past he has developed acute kidney injury due to over diuresis therefore I will closely monitor his BMP PD at I will obtain an echocardiogram as well as serial troponins. I will increase his dose of Imdur as well PD at I would continue him on his home antihypertensive medications  including Coreg Cardizem. If his  serum creatinine function and potassium function allows he may require the addition of lisinopril. He may require cardiology consult as well.  2.Diabetes mellitus  Holding oral hypoglycemic medications and placing the patient on sliding scale   3.Peripheral vascular disease  The patient was scheduled for carotid angiogram which may have to be rescheduled once he is stable   4.Sinus congestion Continue Flonase I will place the patient on saline nasal spray  DVT Prophylaxis: subcutaneous Heparin Nutrition: Cardiac and diabetic diet  Code Status: DNR/DNI  Family Communication: Son  was present at bedside, opportunity was given to ask question and all questions were answered satisfactorily at the time of interview. Disposition: Admitted to inpatient in step-down unit.  Author: Berle Mull, MD [facility] Pager: [phone] 06/13/2013, 6:28 AM    If 7PM-7AM, please contact night-coverage [email or web address] Password [email or web address]

## 2013-06-13 NOTE — ED Notes (Signed)
Lab finished at BS 

## 2013-06-13 NOTE — ED Provider Notes (Signed)
CSN: [id number]     Arrival date & time 06/13/13  0359 History   First MD Initiated Contact with Patient 06/13/13 0400     No chief complaint on file.    (Consider location/radiation/quality/duration/timing/severity/associated sxs/prior Treatment) HPI Patient presents with acute onset shortness of breath that woke him from sleeping. According to EMS he states he been in his normal state of health prior to going to sleep. He's had some ongoing lower extremity swelling. He denies any current chest pain, abdominal pain, fever, nausea or vomiting. Patient does have a history of an aortic arch repair. He was placed on BiPAP by EMS with some improvement of his shortness of breath. Past Medical History  Diagnosis Date  . Diabetes mellitus, type 2   . Hypertension   . GERD (gastroesophageal reflux disease)   . Osteoporosis, senile   . High cholesterol   . Seasonal allergies   . MRSA infection     s/p '02-no problems now  . Anemia   . Stroke   . Transfusion history     '02- back surgery  . PAD (peripheral artery disease)     s/p L Fem-Pop Bypass; R Iliac PTA  . Left carotid artery occlusion     moderate R carotid disease; L Common Carotid Bypassed during Arch Repair.  . H/O thoracic aortic aneurysm repair     Stent Graft (penetrating ulcer)  . Myocardial infarction Feb. 9, 2015    Mild Heart attack  . Flu Feb. 9, 2015    ?  Mild Heart Attack   Past Surgical History  Procedure Laterality Date  . Back surgery  2001  . Cleft lip repair    . Pr vein bypass graft,aorto-fem-pop  1999  . Joint replacement  2003    Right hip   . Colonoscopy with propofol N/A 08/10/2012    Procedure: COLONOSCOPY WITH PROPOFOL;  Surgeon: Garlan Fair, MD;  Location: [facility];  Service: Endoscopy;  Laterality: N/A;  . Esophagogastroduodenoscopy (egd) with propofol N/A 08/10/2012    Procedure: ESOPHAGOGASTRODUODENOSCOPY (EGD) WITH PROPOFOL;  Surgeon: Garlan Fair, MD;  Location: [facility];   Service: Endoscopy;  Laterality: N/A;  . Iliac artery stent Right     PTA -STENT  . 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 Hemiarthroplasty hip Right   . Thoracic aortic endovascular stent graft  2003    @ [facility]  - for Penetrating Ulcer/Aneurysm.  . Vascular surgery     Family History  Problem Relation Age of Onset  . Diabetes Mother   . Hypertension Mother   . Hyperlipidemia Mother   . Diabetes Sister   . Heart disease Son     Heart Disease before age 60  . Heart disease Father   . Heart attack Father   . Arthritis Father    History  Substance Use Topics  . Smoking status: Former Smoker    Quit date: 09/24/2000  . Smokeless tobacco: Never Used  . Alcohol Use: No    Review of Systems  Constitutional: Negative for fever and chills.  Respiratory: Positive for shortness of breath. Negative for cough and wheezing.   Cardiovascular: Positive for leg swelling. Negative for chest pain and palpitations.  Gastrointestinal: Negative for nausea, vomiting and abdominal pain.  Musculoskeletal: Negative for back pain.  Neurological: Negative  for dizziness, weakness, numbness and headaches.  All other systems reviewed and are negative.      Allergies  Codeine  Home Medications   Current Outpatient Rx  Name  Route  Sig  Dispense  Refill  . aspirin EC 81 MG tablet   Oral   Take 81 mg by mouth every morning.          Marland Kitchen atorvastatin (LIPITOR) 40 MG tablet   Oral   Take 20 mg by mouth every morning.          . benazepril (LOTENSIN) 10 MG tablet   Oral   Take 10 mg by mouth at bedtime.          Marland Kitchen CARTIA XT 240 MG 24 hr capsule   Oral   Take 240 mg by mouth daily.          . carvedilol (COREG) 25 MG tablet   Oral   Take 1 tablet (25 mg total) by mouth 2 (two) times daily with a meal.   60 tablet   2    . Cholecalciferol (VITAMIN D) 2000 UNITS tablet   Oral   Take 2,000 Units by mouth daily.         . diphenhydramine-acetaminophen (TYLENOL PM) 25-500 MG TABS   Oral   Take 1 tablet by mouth at bedtime as needed. For sleep         . docusate sodium 100 MG CAPS   Oral   Take 100 mg by mouth 2 (two) times daily.   10 capsule   0   . doxazosin (CARDURA) 2 MG tablet   Oral   Take 2 mg by mouth daily.          . Ferrous Gluconate (IRON) 246 (28 FE) MG TABS   Oral   Take 2 tablets by mouth daily.         . fluticasone (FLONASE) 50 MCG/ACT nasal spray   Nasal   Place 2 sprays into the nose daily.         . furosemide (LASIX) 20 MG tablet   Oral   Take 20 mg by mouth every morning.          Marland Kitchen glimepiride (AMARYL) 2 MG tablet   Oral   Take 2 mg by mouth daily before breakfast.         . HYDROcodone-acetaminophen (NORCO/VICODIN) 5-325 MG per tablet   Oral   Take 2 tablets by mouth every 6 (six) hours as needed for pain.          . hydroxypropyl methylcellulose (ISOPTO TEARS) 2.5 % ophthalmic solution   Both Eyes   Place 1 drop into both eyes daily as needed (dry eyes).          . isosorbide dinitrate (ISORDIL) 10 MG tablet   Oral   Take 1 tablet (10 mg total) by mouth 3 (three) times daily.   90 tablet   2   . loratadine (CLARITIN) 10 MG tablet   Oral   Take 10 mg by mouth daily.         . Multiple Vitamin (MULTIVITAMIN) capsule   Oral   Take 1 capsule by mouth daily.         . niacin (SLO-NIACIN) 500 MG tablet   Oral   Take 500 mg by mouth daily.          . Omega-3 Fatty Acids (FISH OIL) 1200 MG CAPS   Oral   Take 4,800  capsules by mouth every morning.         Marland Kitchen omeprazole (PRILOSEC) 20 MG capsule   Oral   Take 20 mg by mouth daily.         . temazepam (RESTORIL) 30 MG capsule   Oral   Take 30 mg by mouth at bedtime as needed. For sleep          There were no vitals taken for this visit. Physical Exam  Nursing note and  vitals reviewed. Constitutional: He is oriented to person, place, and time. He appears well-developed and well-nourished. He appears distressed.  HENT:  Head: Normocephalic and atraumatic.  Mouth/Throat: Oropharynx is clear and moist.  Eyes: EOM are normal. Pupils are equal, round, and reactive to light.  Neck: Normal range of motion. Neck supple.  Cardiovascular: Normal rate and regular rhythm.   Pulmonary/Chest: Breath sounds normal. No respiratory distress. He has no wheezes. He has no rales. He exhibits no tenderness.  Increased respiratory effort with inspiratory and expiratory crackles throughout  Abdominal: Soft. Bowel sounds are normal. He exhibits no distension and no mass. There is no tenderness. There is no rebound and no guarding.  Musculoskeletal: Normal range of motion. He exhibits no edema and no tenderness.  2+ bilateral lower extremity pitting edema.  Neurological: He is alert and oriented to person, place, and time.  Moves all extremities without any focal weakness. Sensation is grossly intact.  Skin: Skin is warm and dry. No rash noted. No erythema.  Psychiatric: He has a normal mood and affect. His behavior is normal.    ED Course  Procedures (including critical care time) Labs Review Labs Reviewed  PRO B NATRIURETIC PEPTIDE  CBC WITH DIFFERENTIAL  COMPREHENSIVE METABOLIC PANEL  I-STAT [place], ED   Imaging Review No results found.   EKG Interpretation None      Date: 06/13/2013  Rate: 125  Rhythm: sinus tachycardia  QRS Axis: normal  Intervals: normal  ST/T Wave abnormalities: nonspecific T wave changes  Conduction Disutrbances:none  Narrative Interpretation:   Old EKG Reviewed: unchanged   T-wave inversions in lateral leads appear to be on previous EKGs. MDM   Final diagnoses:  None    CRITICAL CARE Performed by: Lita Mains, Prakash Kimberling Total critical care time: 20 min Critical care time was exclusive of separately billable procedures and  treating other patients. Critical care was necessary to treat or prevent imminent or life-threatening deterioration. Critical care was time spent personally by me on the following activities: development of treatment plan with patient and/or surrogate as well as nursing, discussions with consultants, evaluation of patient's response to treatment, examination of patient, obtaining history from patient or surrogate, ordering and performing treatments and interventions, ordering and review of laboratory studies, ordering and review of radiographic studies, pulse oximetry and re-evaluation of patient's condition.   Clinically the patient appears to be in pulmonary edema. Will start on BiPAP, give Lasix and nitroglycerin paste. There is no improvement patient will need to be switched over to nitroglycerin drip.  Patient's diuresis several hundred cc's of urine. He is breathing much more comfortably on the BiPAP. Will discuss with hospitalist regarding admission.  Discussed with Dr. Posey Pronto. Will admit patient to step down bed.  Julianne Rice, MD 06/13/13 [phone]

## 2013-06-14 DIAGNOSIS — N179 Acute kidney failure, unspecified: Secondary | ICD-10-CM

## 2013-06-14 LAB — URINE CULTURE
CULTURE: NO GROWTH
Colony Count: NO GROWTH

## 2013-06-14 LAB — BASIC METABOLIC PANEL
BUN: 34 mg/dL — AB (ref 6–23)
CHLORIDE: 103 meq/L (ref 96–112)
CO2: 24 meq/L (ref 19–32)
CREATININE: 1.5 mg/dL — AB (ref 0.50–1.35)
Calcium: 9 mg/dL (ref 8.4–10.5)
GFR calc non Af Amer: 42 mL/min — ABNORMAL LOW (ref >90)
GFR, EST AFRICAN AMERICAN: 49 mL/min — AB (ref >90)
Glucose, Bld: 120 mg/dL — ABNORMAL HIGH (ref 70–99)
POTASSIUM: 3.3 meq/L — AB (ref 3.7–5.3)
Sodium: 142 mEq/L (ref 137–147)

## 2013-06-14 LAB — GLUCOSE, CAPILLARY
GLUCOSE-CAPILLARY: 122 mg/dL — AB (ref 70–99)
Glucose-Capillary: 137 mg/dL — ABNORMAL HIGH (ref 70–99)
Glucose-Capillary: 178 mg/dL — ABNORMAL HIGH (ref 70–99)

## 2013-06-14 LAB — CBC
HCT: 28.2 % — ABNORMAL LOW (ref 39.0–52.0)
HEMOGLOBIN: 10 g/dL — AB (ref 13.0–17.0)
MCH: 33.9 pg (ref 26.0–34.0)
MCHC: 35.5 g/dL (ref 30.0–36.0)
MCV: 95.6 fL (ref 78.0–100.0)
Platelets: 105 10*3/uL — ABNORMAL LOW (ref 150–400)
RBC: 2.95 MIL/uL — ABNORMAL LOW (ref 4.22–5.81)
RDW: 12.7 % (ref 11.5–15.5)
WBC: 7.3 10*3/uL (ref 4.0–10.5)

## 2013-06-14 LAB — HEMOGLOBIN A1C
Hgb A1c MFr Bld: 6 % — ABNORMAL HIGH (ref <5.7)
Mean Plasma Glucose: 126 mg/dL — ABNORMAL HIGH (ref <117)

## 2013-06-14 MED ORDER — POTASSIUM CHLORIDE CRYS ER 20 MEQ PO TBCR
40.0000 meq | EXTENDED_RELEASE_TABLET | Freq: Two times a day (BID) | ORAL | Status: AC
  Administered 2013-06-14 – 2013-06-15 (×3): 40 meq via ORAL
  Filled 2013-06-14 (×4): qty 2

## 2013-06-14 MED ORDER — FUROSEMIDE 40 MG PO TABS
40.0000 mg | ORAL_TABLET | Freq: Two times a day (BID) | ORAL | Status: DC
  Filled 2013-06-14 (×3): qty 1

## 2013-06-14 MED ORDER — FUROSEMIDE 40 MG PO TABS
40.0000 mg | ORAL_TABLET | Freq: Two times a day (BID) | ORAL | Status: DC
  Administered 2013-06-14 – 2013-06-16 (×4): 40 mg via ORAL
  Filled 2013-06-14 (×7): qty 1

## 2013-06-14 MED ORDER — CARVEDILOL 25 MG PO TABS
25.0000 mg | ORAL_TABLET | Freq: Two times a day (BID) | ORAL | Status: DC
  Administered 2013-06-14 – 2013-06-16 (×4): 25 mg via ORAL
  Filled 2013-06-14 (×7): qty 1

## 2013-06-14 MED ORDER — FUROSEMIDE 40 MG PO TABS: 40.0000 mg | ORAL_TABLET | Freq: Every day | ORAL | Status: DC

## 2013-06-14 NOTE — Progress Notes (Signed)
[facility]  [name] [id number] DOB: 04/21/1930 DOA: 06-27-13 PCP: [name], MD  Brief narrative: 80 year old male patient known to this service from previous admission. Return to the emergency department because of shortness of breath. During last admission was diagnosed with mild chronic systolic heart failure and was discharged on ACE inhibitor and Lasix. Within 3 days of being discharged patient was noted with progressive renal insufficiency and hyperkalemia and his primary care physician discontinued both of these medications. According to the son they do not monitor the patient's weight regularly but they have noticed that his legs have been swelling right greater than left over the past few weeks after discharge.  Current symptoms began after patient awakened from nap with sudden shortness of breath and some mild chest tightness and was having difficulty talking due to shortness of breath.  Patient was also scheduled for outpatient carotid angiogram for 06-27-13 to evaluate carotid stenosis detected during last admission.  Assessment/Plan: Active Problems:   Acute respiratory failure with hypoxia/Acute decompensated heart failure -suspect due to recent cessation of ACE I and Lasix (done 3 days after dc) -change Lasix IV TID to PO BID and follow lytes-add Kdur-suspect will need weight based prn Lasix after dc -cont supportive care -wean O2 as tolerates - out ~ 3000 cc since admit and weight down 4 lbs -was not doing daily weights at home- ? Needs diet instrudtion -cont Isordil for after load reduction    Hypertension -BP not well controlled -resumed Coreg as CHF better controlled    Diabetes mellitus, type 2 -not on meds pre admit -HgbA1c 6.0    Thrombocytopenia -- UA unremarkable and cx with no growth so doubt infectious -had intermittent low platelets last admit- watch closely on Lasix    Occlusion and stenosis of  carotid artery without mention of cerebral infarction    PAD (peripheral artery disease)   DVT prophylaxis: SQ Heparin Code Status: DO NOT RESUSCITATE Family Communication: Son at the bedside Disposition Plan/Expected LOS: Stepdown   Consultants: None  Procedures: Bilateral lower extremity venous duplex Preliminary report negative for DVT  2-D echocardiogram pending  Antibiotics: None  HPI/Subjective: Patient alert and without complaints  Objective: Blood pressure 150/61, pulse 80, temperature 98.4 F (36.9 C), temperature source Oral, resp. rate 21, height 5\' 5"  (1.651 m), weight 161 lb 6 oz (73.2 kg), SpO2 96.00%.  Intake/Output Summary (Last 24 hours) at 06/14/13 1055 Last data filed at 06/14/13 1010  Gross per 24 hour  Intake   1453 ml  Output   3255 ml  Net  -1802 ml     Exam: General: No acute respiratory distress Lungs: Bilateral crackles-primarily at bases and not as pronounced as on Jun 28, 2022, has weaned to RA Cardiovascular: Regular rate and rhythm without murmur gallop or rub normal S1 and S2, no peripheral edema or JVD Abdomen: Nontender, nondistended, soft, bowel sounds positive, no rebound, no ascites, no appreciable mass Musculoskeletal: No significant cyanosis, clubbing of bilateral lower extremities Neurological: Alert and oriented x 3, moves all extremities x 4 without focal neurological deficits, CN 2-12 intact  Scheduled Meds:  Scheduled Meds: . aspirin EC  81 mg Oral q morning - 10a  . atorvastatin  20 mg Oral q morning - 10a  . carvedilol  25 mg Oral BID WC  . diltiazem  240 mg Oral Daily  . docusate sodium  200 mg Oral QHS  . doxazosin  2 mg Oral Daily  .  fluticasone  2 spray Each Nare Daily  . furosemide  40 mg Intravenous 3 times per day  . heparin  5,000 Units Subcutaneous 3 times per day  . insulin aspart  0-15 Units Subcutaneous TID WC  . insulin aspart  0-5 Units Subcutaneous QHS  . isosorbide dinitrate  20 mg Oral TID  . loratadine   10 mg Oral Daily  . pantoprazole  40 mg Oral Daily  . potassium chloride  40 mEq Oral BID  . sodium chloride  3 mL Intravenous Q12H   Continuous Infusions:   Data Reviewed: Basic Metabolic Panel:  Recent Labs Lab 06/13/13 0404 06/13/13 1110 06/14/13 0311  NA 143 139 142  K 4.3 4.3 3.3*  CL 107 101 103  CO2 21 23 24   GLUCOSE 191* 186* 120*  BUN 23 25* 34*  CREATININE 1.11 1.09 1.50*  CALCIUM 9.1 9.3 9.0   Liver Function Tests:  Recent Labs Lab 06/13/13 0404  AST 21  ALT 12  ALKPHOS 90  BILITOT <0.2*  PROT 6.8  ALBUMIN 3.6   No results found for this basename: LIPASE, AMYLASE,  in the last 168 hours No results found for this basename: AMMONIA,  in the last 168 hours CBC:  Recent Labs Lab 06/13/13 0404 06/14/13 0311  WBC 7.9 7.3  NEUTROABS 4.4  --   HGB 12.2* 10.0*  HCT 35.9* 28.2*  MCV 96.8 95.6  PLT 127* 105*   Cardiac Enzymes:  Recent Labs Lab 06/13/13 0637 06/13/13 1228 06/13/13 1818  TROPONINI 0.58* 1.53* 1.46*   BNP (last 3 results)  Recent Labs  05/10/13 1855 06/13/13 0409  PROBNP 18246.0* 3921.0*   CBG:  Recent Labs Lab 06/13/13 0751 06/13/13 1224 06/13/13 1635 06/13/13 2215  GLUCAP 139* 171* 191* 182*    Recent Results (from the past 240 hour(s))  MRSA PCR SCREENING     Status: None   Collection Time    06/13/13  7:39 AM      Result Value Ref Range Status   MRSA by PCR NEGATIVE  NEGATIVE Final   Comment:            The GeneXpert MRSA Assay (FDA     approved for NASAL specimens     only), is one component of a     comprehensive MRSA colonization     surveillance program. It is not     intended to diagnose MRSA     infection nor to guide or     monitor treatment for     MRSA infections.  URINE CULTURE     Status: None   Collection Time    06/13/13  2:00 PM      Result Value Ref Range Status   Specimen Description URINE, CLEAN CATCH   Final   Special Requests NONE   Final   Culture  Setup Time     Final   Value:  06/13/2013 14:33     Performed at [place]     Final   Value: NO GROWTH     Performed at [facility]   Culture     Final   Value: NO GROWTH     Performed at [facility]   Report Status 06/14/2013 FINAL   Final     Studies:  Recent x-ray studies have been reviewed in detail by the Attending Physician  Time spent :     Erin Hearing, [place] [facility] Office  [phone] Pager  [phone]  **If unable to reach the above provider after paging please contact the Flow Manager @ [phone]  On-Call/Text Page:      [email or web address]      password [email or web address]  If 7PM-7AM, please contact night-coverage [email or web address] Password [facility] 06/14/2013, 10:55 AM   LOS: 1 day   I have examined the patient, reviewed the chart and modified the above note which I agree with.   Eriyanna Kofoed,MD Pager # on [email or web address] 06/14/2013, 3:12 PM

## 2013-06-14 NOTE — Progress Notes (Signed)
[facility]  Patient Status: Active (receiving services up to time of hospitalization)  [facility] is providing the following services: RN and PT  If patient discharges after hours, please call [phone].   [name] 06/14/2013, 10:39 AM

## 2013-06-14 NOTE — Consult Note (Signed)
Heart Failure Navigator Consult Note  Presentation: [name] is a 80 y.o. male with Past medical history of diabetes mellitus, hypertension, GERD, recent admission for influenza pneumonia, peripheral vascular disease, coronary artery disease, history of aneurysm repair, recent admission for acute on chronic systolic heart failure.  The patient was brought and as he started having complaints of shortness of breath. He mentions that he was on Lasix at the time of the discharge from the hospital a month ago but due to is progressively worsening renal function as well as hyperkalemia he was taken off of his Lasix as well as lisinopril. He has not been watching his weight on a regular basis but mentions that that has been some swelling of his legs right more than left since last few weeks. He denies any orthopnea PND chest pain, fever, cough, nausea, vomiting, choking, diarrhea, burning urination prior to today.  When he went to sleep today he suddenly started having shortness of breath this was associated with mild substernal chest tightness and he couldn't tak any bread therefore he came to the hospital. The chest tightness has completely resolved at present. He mention he is compliant with his medication and there is no recent change in his medication other than taking off of Lasix and lisinopril.  He denies change in his diet.  PMHx:   Past Medical History  Diagnosis Date  . Diabetes mellitus, type 2   . Hypertension   . GERD (gastroesophageal reflux disease)   . Osteoporosis, senile   . High cholesterol   . Seasonal allergies   . MRSA infection     s/p '02-no problems now  . Anemia   . Stroke   . Transfusion history     '02- back surgery  . PAD (peripheral artery disease)     s/p L Fem-Pop Bypass; R Iliac PTA  . Left carotid artery occlusion     moderate R carotid disease; L Common Carotid Bypassed during Arch Repair.  . H/O thoracic aortic aneurysm repair     Stent Graft  (penetrating ulcer)  . Myocardial infarction Feb. 9, 2015    Mild Heart attack  . Flu Feb. 9, 2015    ?  Mild Heart Attack    History   Social History  . Marital Status: Widowed    Spouse Name: N/A    Number of Children: N/A  . Years of Education: N/A   Social History Main Topics  . Smoking status: Former Smoker    Quit date: 09/24/2000  . Smokeless tobacco: Never Used  . Alcohol Use: No  . Drug Use: No  . Sexual Activity: Not Currently   Other Topics Concern  . None   Social History Narrative  . None    ECHO:Study Conclusions 06/13/13  Left ventricle: The cavity size was normal. Wall thickness was normal. Systolic function was normal. The estimated ejection fraction was in the range of 55% to 65%. Regional wall motion abnormalities cannot be excluded. There was an increased relative contribution of atrial contraction to ventricular filling.  Impressions:  - the image quality was not sufficient to evaluate wall motion.  The RV wsa not seen well at all.    BNP    Component Value Date/Time   PROBNP 3921.0* 06/13/2013 0409    Education Assessment and Provision:  Detailed education and instructions provided on heart failure disease management including the following:  Signs and symptoms of Heart Failure When to call the physician Importance of daily weights Low  sodium diet  Fluid restriction Medication management Anticipated future follow-up appointments  Patient education given on each of the above topics.  Patient acknowledges understanding and acceptance of all instructions.  Patient says that HF is new for him --he has "no" knowledge previously.  I will add a consult to Dietician for further low sodium dietary education.  Education Materials:  "Living Better With Heart Failure" Booklet, Daily Weight Tracker Tool and Heart Failure Educational Video.   High Risk Criteria for Readmission and/or Poor Patient Outcomes:   EF <30%-No  2nd admission  in 6 months-Yes  Difficult social situation-No  Demonstrates medication noncompliance-No    Barriers of Care:  Knowledge of HF and medical conditions  Discharge Planning:  Patient is to discharge to home with his son.  He has a Actuary that is with him each day from 8a-5p.  He could also benefit with HHRN to assist with transition at home and further HF education.

## 2013-06-14 NOTE — Progress Notes (Signed)
Chaplain gave support to pt by sitting and having conversation, listening empathically and through offering a prayer.     06/14/13 1100  Clinical Encounter Type  Visited With Patient  Visit Type Spiritual support  Referral From Nurse    Estelle June, chaplain pager [phone]

## 2013-06-14 NOTE — Care Management Note (Signed)
    Page 1 of 2   06/16/2013     1:48:28 PM   CARE MANAGEMENT NOTE 06/16/2013  Patient:  AHMIR, BRACKEN   Account Number:  [id number]  Date Initiated:  06/14/2013  Documentation initiated by:  Luz Lex  Subjective/Objective Assessment:   Increased SOB - CHF     Action/Plan:   Anticipated DC Date:  06/17/2013   Anticipated DC Plan:  [place]  CM consult      [facility] Choice  Resumption Of Svcs/PTA Provider   Choice offered to / List presented to:  C-1 Patient        [place] arranged  HH-1 RN  [place].   Status of service:  Completed, signed off Medicare Important Message given?   (If response is "NO", the following Medicare IM given date fields will be blank) Date Medicare IM given:   Date Additional Medicare IM given:    Discharge Disposition:  [place]  Per UR Regulation:    If discussed at Long Length of Stay Meetings, dates discussed:    Comments:  Contact:  Gwynne,Eric Son [phone]  06/16/13 Cedricka Sackrider,RN,BSN [phone] PT [place]; PT AGREEABLE WITH RESUMPTION OF HH SERVICES AS PRIOR TO ADMISSION.  NOTIFIED [place].  06-14-13 8:50am [place] [phone] Readmission - was home with [facility] - PT and RN previously. Taken off lasix and ACE and came back in with SOB. ?? need for higher level - [place] depending on progression??

## 2013-06-14 NOTE — Progress Notes (Signed)
UR Completed.  [name] [id number] 06/14/2013

## 2013-06-15 LAB — GLUCOSE, CAPILLARY
GLUCOSE-CAPILLARY: 138 mg/dL — AB (ref 70–99)
GLUCOSE-CAPILLARY: 126 mg/dL — AB (ref 70–99)
Glucose-Capillary: 151 mg/dL — ABNORMAL HIGH (ref 70–99)
Glucose-Capillary: 144 mg/dL — ABNORMAL HIGH (ref 70–99)
Glucose-Capillary: 151 mg/dL — ABNORMAL HIGH (ref 70–99)

## 2013-06-15 LAB — BASIC METABOLIC PANEL
BUN: 42 mg/dL — ABNORMAL HIGH (ref 6–23)
CO2: 26 mEq/L (ref 19–32)
Calcium: 9.2 mg/dL (ref 8.4–10.5)
Chloride: 100 mEq/L (ref 96–112)
Creatinine, Ser: 1.6 mg/dL — ABNORMAL HIGH (ref 0.50–1.35)
GFR calc Af Amer: 45 mL/min — ABNORMAL LOW (ref >90)
GFR calc non Af Amer: 39 mL/min — ABNORMAL LOW (ref >90)
Glucose, Bld: 142 mg/dL — ABNORMAL HIGH (ref 70–99)
Potassium: 4 mEq/L (ref 3.7–5.3)
Sodium: 140 mEq/L (ref 137–147)

## 2013-06-15 MED ORDER — SODIUM CHLORIDE 0.9 % IJ SOLN
3.0000 mL | Freq: Two times a day (BID) | INTRAMUSCULAR | Status: DC
  Administered 2013-06-15 (×2): 3 mL via INTRAVENOUS

## 2013-06-15 MED ORDER — SODIUM CHLORIDE 0.9 % IJ SOLN: 3.0000 mL | INTRAMUSCULAR | Status: DC | PRN

## 2013-06-15 NOTE — Plan of Care (Signed)
Problem: Food- and Nutrition-Related Knowledge Deficit (NB-1.1) Goal: Nutrition education Formal process to instruct or train a patient/client in a skill or to impart knowledge to help patients/clients voluntarily manage or modify food choices and eating behavior to maintain or improve health. Outcome: Completed/Met Date Met:  06/15/13  RD consulted for low sodium diet education.  RD provided "Low Sodium Nutrition Therapy" handout from the Academy of Nutrition and Dietetics. Reviewed patient's dietary recall. Provided examples on ways to decrease sodium intake in diet. Discouraged intake of processed foods and use of salt shaker. Encouraged fresh fruits and vegetables as well as whole grain sources of carbohydrates to maximize fiber intake. Teach back method used.  Expect fair compliance.  Body mass index is 24.47 kg/(m^2). Pt meets criteria for Normal based on current BMI.  Current diet order is Heart Healthy/Carbohydrate Modified, patient eating lunch upon RD visit.  Labs and medications reviewed.  No further nutrition interventions warranted at this time. RD contact information provided. If additional nutrition issues arise, please re-consult RD.   Arthur Holms, RD, LDN Pager #: [phone] After-Hours Pager #: [phone]

## 2013-06-15 NOTE — Evaluation (Addendum)
Physical Therapy Evaluation Patient Details Name: [name] MRN: [id number] DOB: 09/10/1930. Today's Date: 06/15/2013 Time: 6962-9528 PT Time Calculation (min): 32 min  PT Assessment / Plan / Recommendation History of present Illness  Admitted with SOB/respiratory failure with hypoxia and decompensated heart failure.  Clinical Impression  Pt admitted with/for problem above.  Pt currently limited functionally due to the problems listed below.  (see problems list.)  Pt will benefit from PT to maximize function and safety to be able to get home safely with available assist.     PT Assessment  Patient needs continued PT services    Follow Up Recommendations  No PT follow up;Supervision for mobility/OOB    Does the patient have the potential to tolerate intense rehabilitation      Barriers to Discharge        Equipment Recommendations  None recommended by PT    Recommendations for Other Services     Frequency Min 3X/week    Precautions / Restrictions Precautions Precautions: Fall   Pertinent Vitals/Pain       Mobility  Transfers Overall transfer level: Needs assistance Transfers: Sit to/from Stand Sit to Stand: Min guard General transfer comment: struggle standing from lower surface Ambulation/Gait Ambulation/Gait assistance: Min guard Ambulation Distance (Feet): 200 Feet Assistive device: Rolling walker (2 wheeled) Gait Pattern/deviations: Step-through pattern;Trunk flexed Gait velocity: slower Gait velocity interpretation: Below normal speed for age/gender General Gait Details: Generally steady, but often too far away from the RW    Exercises     PT Diagnosis: Generalized weakness;Other (comment) (decr activity tolerance)  PT Problem List: Decreased strength;Decreased activity tolerance;Decreased balance;Decreased knowledge of use of DME PT Treatment Interventions:       PT Goals(Current goals can be found in the care plan section) Acute Rehab PT  Goals Patient Stated Goal: be able to go home and eventually stay by myself PT Goal Formulation: With patient Time For Goal Achievement: 06/22/13 Potential to Achieve Goals: Good  Visit Information  Last PT Received On: 06/15/13 Assistance Needed: +1 History of Present Illness: admitted with  respiratory failure and hypoxia and decompensated heart failure.       Prior [place] expects to be discharged to:: Private residence Living Arrangements: Children (has sitter days and son lives with him) Available Help at Discharge: Available 24 hours/day Type of Home: House Home Access: Ramped entrance [place]: One [place]: Environmental consultant - 2 wheels;Cane - single point;Shower seat;Grab bars - tub/shower;Hand held shower head Prior Function Level of Independence: Independent with assistive device(s) Comments: pt does all his own ADLs including cooking and driving Communication Communication: HOH Dominant Hand: Right    Cognition  Cognition Overall Cognitive Status: Within Functional Limits for tasks assessed    Extremity/Trunk Assessment Upper Extremity Assessment Upper Extremity Assessment: Overall WFL for tasks assessed Lower Extremity Assessment Lower Extremity Assessment: Overall WFL for tasks assessed (L weaker than R, but functional)   Balance Balance Overall balance assessment: Needs assistance Sitting-balance support: Feet supported;No upper extremity supported Sitting balance-Leahy Scale: Good Standing balance support: Bilateral upper extremity supported Standing balance-Leahy Scale: Fair  End of Session PT - End of Session Activity Tolerance: Patient tolerated treatment well Patient left: in chair;with call bell/phone within reach Nurse Communication: Mobility status  GP     Delos Klich, Tessie Fass 06/15/2013, 5:44 PM 06/15/2013  Donnella Sham, PT [phone]  (pager)

## 2013-06-15 NOTE — Progress Notes (Signed)
Patient ID: ARYEH BUTTERFIELD, male   DOB: 12-08-1930, 80 y.o.   MRN: [id number] [facility] PROGRESS NOTE  ORVEL CUTSFORTH LOV:[id number] DOB: October 19, 1930 DOA: 06/13/2013 PCP: [name], MD  Brief narrative:  Patient is 80 year old male with hypertension, diabetes type 2, chronic systolic congestive heart failure, recently admitted for management of acute on chronic systolic heart failure, discharged on ACE inhibitor and Lasix but due to worsening renal failure with hyperkalemia, PCP has discontinued both medicines and 3 days post discharge patient now readmitted for progressively worsening lower extremity edema and weight gain of several pounds. Of note, patient was also scheduled for an outpatient carotid angiogram 06/13/2013 to evaluate carotid stenosis detected during last admission.   Assessment/Plan:  Active Problems:  Acute respiratory failure with hypoxia/Acute decompensated heart failure (combined systolic and diastolic) - suspect due to recent cessation of ACE I and Lasix (done 3 days after dc)  - Patient started on Lasix IV initially, weight on admission 165 pounds, responding well with following weight trend 165 lbs --> 161 lbs --> 147 lbs this AM - Continue Lasix, transition to by mouth, 40 mg twice a day  - 2-D echo 06/13/2013 with ejection fraction 88-41%, mild diastolic dysfunction - Patient euvolemic on exam, monitor daily weights, I's/O's  Acute renal failure - Likely exacerbated by use of Lasix  - Patient now transitioned to Lasix by mouth, weight significantly down since admission, 18 pounds - Repeat BMP in the morning and readjust the dose of Lasix as clinically indicated Hypertension  - Reasonable inpatient control, continue Imdur, Corag, Cardizem Diabetes mellitus, type 2  - HgbA1c 6.0  - continue SSI  Thrombocytopenia  - UA unremarkable and cx with no growth so doubt infectious  - No signs of active bleeding, repeat CBC in the morning  Occlusion and  stenosis of carotid artery without mention of cerebral infarction  - Continue statin Acute on chronic blood loss anemia - Drop in hemoglobin since admission, no signs of active bleeding, repeat CBC in the morning PAD (peripheral artery disease)   DVT prophylaxis: SQ Heparin  Code Status: DO NOT RESUSCITATE  Family Communication: Son at the bedside   Consultants:   None  Procedures:   Bilateral lower extremity venous duplex Preliminary report negative for DVT   2-D echocardiogram  Ejection fraction 55-60% with mild diastolic dysfunction Antibiotics:   None   HPI/Subjective: No events overnight.   Objective: Filed Vitals:   06/14/13 1345 06/14/13 2200 06/15/13 0419 06/15/13 0422  BP: 113/49 119/50 125/61   Pulse: 92 82 83   Temp: 97.7 F (36.5 C) 98.6 F (37 C) 99.3 F (37.4 C)   TempSrc: Oral Oral Oral   Resp: 18 18 20    Height:      Weight:    66.7 kg (147 lb 0.8 oz)  SpO2: 95% 95% 92%     Intake/Output Summary (Last 24 hours) at 06/15/13 1133 Last data filed at 06/15/13 0900  Gross per 24 hour  Intake    840 ml  Output   1625 ml  Net   -785 ml    Exam:   General:  Pt is alert, follows commands appropriately, not in acute distress  Cardiovascular: Regular rate and rhythm, S1/S2, no murmurs, no rubs, no gallops  Respiratory: Clear to auscultation bilaterally, no wheezing, no crackles, no rhonchi  Abdomen: Soft, non tender, non distended, bowel sounds present, no guarding  Extremities: No edema, pulses DP and PT palpable bilaterally  Neuro: Grossly nonfocal  Data Reviewed: Basic Metabolic Panel:  Recent Labs Lab 06/13/13 0404 06/13/13 1110 06/14/13 0311 06/15/13 0312  NA 143 139 142 140  K 4.3 4.3 3.3* 4.0  CL 107 101 103 100  CO2 21 23 24 26   GLUCOSE 191* 186* 120* 142*  BUN 23 25* 34* 42*  CREATININE 1.11 1.09 1.50* 1.60*  CALCIUM 9.1 9.3 9.0 9.2   Liver Function Tests:  Recent Labs Lab 06/13/13 0404  AST 21  ALT 12  ALKPHOS 90   BILITOT <0.2*  PROT 6.8  ALBUMIN 3.6   CBC:  Recent Labs Lab 06/13/13 0404 06/14/13 0311  WBC 7.9 7.3  NEUTROABS 4.4  --   HGB 12.2* 10.0*  HCT 35.9* 28.2*  MCV 96.8 95.6  PLT 127* 105*   Cardiac Enzymes:  Recent Labs Lab 06/13/13 0637 06/13/13 1228 06/13/13 1818  TROPONINI 0.58* 1.53* 1.46*   CBG:  Recent Labs Lab 06/14/13 0750 06/14/13 1121 06/14/13 1646 06/14/13 2157 06/15/13 0614  GLUCAP 137* 178* 122* 138* 151*    Recent Results (from the past 240 hour(s))  MRSA PCR SCREENING     Status: None   Collection Time    06/13/13  7:39 AM      Result Value Ref Range Status   MRSA by PCR NEGATIVE  NEGATIVE Final   Comment:            The GeneXpert MRSA Assay (FDA     approved for NASAL specimens     only), is one component of a     comprehensive MRSA colonization     surveillance program. It is not     intended to diagnose MRSA     infection nor to guide or     monitor treatment for     MRSA infections.  URINE CULTURE     Status: None   Collection Time    06/13/13  2:00 PM      Result Value Ref Range Status   Specimen Description URINE, CLEAN CATCH   Final   Special Requests NONE   Final   Culture  Setup Time     Final   Value: 06/13/2013 14:33     Performed at [place]     Final   Value: NO GROWTH     Performed at [facility]   Culture     Final   Value: NO GROWTH     Performed at [facility]   Report Status 06/14/2013 FINAL   Final     Scheduled Meds: . aspirin EC  81 mg Oral q morning - 10a  . atorvastatin  20 mg Oral q morning - 10a  . carvedilol  25 mg Oral BID WC  . diltiazem  240 mg Oral Daily  . docusate sodium  200 mg Oral QHS  . doxazosin  2 mg Oral Daily  . fluticasone  2 spray Each Nare Daily  . furosemide  40 mg Oral BID  . heparin  5,000 Units Subcutaneous 3 times per day  . insulin aspart  0-15 Units Subcutaneous TID WC  . insulin aspart  0-5 Units Subcutaneous QHS  .  isosorbide dinitrate  20 mg Oral TID  . loratadine  10 mg Oral Daily  . pantoprazole  40 mg Oral Daily  . potassium chloride  40 mEq Oral BID   Continuous Infusions:    Faye Ramsay, MD  [facility] Pager [phone]  If 7PM-7AM, please contact night-coverage [email or web address]  Password [email or web address] 06/15/2013, 11:33 AM   LOS: 2 days

## 2013-06-16 DIAGNOSIS — E860 Dehydration: Secondary | ICD-10-CM

## 2013-06-16 LAB — BASIC METABOLIC PANEL
BUN: 51 mg/dL — AB (ref 6–23)
CHLORIDE: 99 meq/L (ref 96–112)
CO2: 25 mEq/L (ref 19–32)
CREATININE: 1.74 mg/dL — AB (ref 0.50–1.35)
Calcium: 9.1 mg/dL (ref 8.4–10.5)
GFR calc Af Amer: 41 mL/min — ABNORMAL LOW (ref >90)
GFR calc non Af Amer: 35 mL/min — ABNORMAL LOW (ref >90)
Glucose, Bld: 148 mg/dL — ABNORMAL HIGH (ref 70–99)
Potassium: 4.2 mEq/L (ref 3.7–5.3)
Sodium: 138 mEq/L (ref 137–147)

## 2013-06-16 LAB — GLUCOSE, CAPILLARY
GLUCOSE-CAPILLARY: 142 mg/dL — AB (ref 70–99)
GLUCOSE-CAPILLARY: 163 mg/dL — AB (ref 70–99)

## 2013-06-16 LAB — CBC
HEMATOCRIT: 30.1 % — AB (ref 39.0–52.0)
Hemoglobin: 10.3 g/dL — ABNORMAL LOW (ref 13.0–17.0)
MCH: 32.6 pg (ref 26.0–34.0)
MCHC: 34.2 g/dL (ref 30.0–36.0)
MCV: 95.3 fL (ref 78.0–100.0)
Platelets: 114 10*3/uL — ABNORMAL LOW (ref 150–400)
RBC: 3.16 MIL/uL — ABNORMAL LOW (ref 4.22–5.81)
RDW: 12.5 % (ref 11.5–15.5)
WBC: 6.3 10*3/uL (ref 4.0–10.5)

## 2013-06-16 MED ORDER — FUROSEMIDE 40 MG PO TABS: 20.0000 mg | ORAL_TABLET | Freq: Every day | ORAL | Status: DC

## 2013-06-16 NOTE — Discharge Instructions (Signed)
Heart Failure °Heart failure is a condition in which the heart has trouble pumping blood. This means your heart does not pump blood efficiently for your body to work well. In some cases of heart failure, fluid may back up into your lungs or you may have swelling (edema) in your lower legs. Heart failure is usually a long-term (chronic) condition. It is important for you to take good care of yourself and follow your caregiver's treatment plan. °CAUSES  °Some health conditions can cause heart failure. Those health conditions include: °· High blood pressure (hypertension) causes the heart muscle to work harder than normal. When pressure in the blood vessels is high, the heart needs to pump (contract) with more force in order to circulate blood throughout the body. High blood pressure eventually causes the heart to become stiff and weak. °· Coronary artery disease (CAD) is the buildup of cholesterol and fat (plaque) in the arteries of the heart. The blockage in the arteries deprives the heart muscle of oxygen and blood. This can cause chest pain and may lead to a heart attack. High blood pressure can also contribute to CAD. °· Heart attack (myocardial infarction) occurs when 1 or more arteries in the heart become blocked. The loss of oxygen damages the muscle tissue of the heart. When this happens, part of the heart muscle dies. The injured tissue does not contract as well and weakens the heart's ability to pump blood. °· Abnormal heart valves can cause heart failure when the heart valves do not open and close properly. This makes the heart muscle pump harder to keep the blood flowing. °· Heart muscle disease (cardiomyopathy or myocarditis) is damage to the heart muscle from a variety of causes. These can include drug or alcohol abuse, infections, or unknown reasons. These can increase the risk of heart failure. °· Lung disease makes the heart work harder because the lungs do not work properly. This can cause a strain  on the heart, leading it to fail. °· Diabetes increases the risk of heart failure. High blood sugar contributes to high fat (lipid) levels in the blood. Diabetes can also cause slow damage to tiny blood vessels that carry important nutrients to the heart muscle. When the heart does not get enough oxygen and food, it can cause the heart to become weak and stiff. This leads to a heart that does not contract efficiently. °· Other conditions can contribute to heart failure. These include abnormal heart rhythms, thyroid problems, and low blood counts (anemia). °Certain unhealthy behaviors can increase the risk of heart failure. Those unhealthy behaviors include: °· Being overweight. °· Smoking or chewing tobacco. °· Eating foods high in fat and cholesterol. °· Abusing illicit drugs or alcohol. °· Lacking physical activity. °SYMPTOMS  °Heart failure symptoms may vary and can be hard to detect. Symptoms may include: °· Shortness of breath with activity, such as climbing stairs. °· Persistent cough. °· Swelling of the feet, ankles, legs, or abdomen. °· Unexplained weight gain. °· Difficulty breathing when lying flat (orthopnea). °· Waking from sleep because of the need to sit up and get more air. °· Rapid heartbeat. °· Fatigue and loss of energy. °· Feeling lightheaded, dizzy, or close to fainting. °· Loss of appetite. °· Nausea. °· Increased urination during the night (nocturia). °DIAGNOSIS  °A diagnosis of heart failure is based on your history, symptoms, physical examination, and diagnostic tests. °Diagnostic tests for heart failure may include: °· Echocardiography. °· Electrocardiography. °· Chest X-ray. °· Blood tests. °· Exercise   stress test. °· Cardiac angiography. °· Radionuclide scans. °TREATMENT  °Treatment is aimed at managing the symptoms of heart failure. Medicines, behavioral changes, or surgical intervention may be necessary to treat heart failure. °· Medicines to help treat heart failure may  include: °· Angiotensin-converting enzyme (ACE) inhibitors. This type of medicine blocks the effects of a blood protein called angiotensin-converting enzyme. ACE inhibitors relax (dilate) the blood vessels and help lower blood pressure. °· Angiotensin receptor blockers. This type of medicine blocks the actions of a blood protein called angiotensin. Angiotensin receptor blockers dilate the blood vessels and help lower blood pressure. °· Water pills (diuretics). Diuretics cause the kidneys to remove salt and water from the blood. The extra fluid is removed through urination. This loss of extra fluid lowers the volume of blood the heart pumps. °· Beta blockers. These prevent the heart from beating too fast and improve heart muscle strength. °· Digitalis. This increases the force of the heartbeat. °· Healthy behavior changes include: °· Obtaining and maintaining a healthy weight. °· Stopping smoking or chewing tobacco. °· Eating heart healthy foods. °· Limiting or avoiding alcohol. °· Stopping illicit drug use. °· Physical activity as directed by your caregiver. °· Surgical treatment for heart failure may include: °· A procedure to open blocked arteries, repair damaged heart valves, or remove damaged heart muscle tissue. °· A pacemaker to improve heart muscle function and control certain abnormal heart rhythms. °· An internal cardioverter defibrillator to treat certain serious abnormal heart rhythms. °· A left ventricular assist device to assist the pumping ability of the heart. °HOME CARE INSTRUCTIONS  °· Take your medicine as directed by your caregiver. Medicines are important in reducing the workload of your heart, slowing the progression of heart failure, and improving your symptoms. °· Do not stop taking your medicine unless directed by your caregiver. °· Do not skip any dose of medicine. °· Refill your prescriptions before you run out of medicine. Your medicines are needed every day. °· Take over-the-counter  medicine only as directed by your caregiver or pharmacist. °· Engage in moderate physical activity if directed by your caregiver. Moderate physical activity can benefit some people. The elderly and people with severe heart failure should consult with a caregiver for physical activity recommendations. °· Eat heart healthy foods. Food choices should be free of trans fat and low in saturated fat, cholesterol, and salt (sodium). Healthy choices include fresh or frozen fruits and vegetables, fish, lean meats, legumes, fat-free or low-fat dairy products, and whole grain or high fiber foods. Talk to a dietitian to learn more about heart healthy foods. °· Limit sodium if directed by your caregiver. Sodium restriction may reduce symptoms of heart failure in some people. Talk to a dietitian to learn more about heart healthy seasonings. °· Use healthy cooking methods. Healthy cooking methods include roasting, grilling, broiling, baking, poaching, steaming, or stir-frying. Talk to a dietitian to learn more about healthy cooking methods. °· Limit fluids if directed by your caregiver. Fluid restriction may reduce symptoms of heart failure in some people. °· Weigh yourself every day. Daily weights are important in the early recognition of excess fluid. You should weigh yourself every morning after you urinate and before you eat breakfast. Wear the same amount of clothing each time you weigh yourself. Record your daily weight. Provide your caregiver with your weight record. °· Monitor and record your blood pressure if directed by your caregiver. °· Check your pulse if directed by your caregiver. °· Lose weight if directed   by your caregiver. Weight loss may reduce symptoms of heart failure in some people. °· Stop smoking or chewing tobacco. Nicotine makes your heart work harder by causing your blood vessels to constrict. Do not use nicotine gum or patches before talking to your caregiver. °· Schedule and attend follow-up visits as  directed by your caregiver. It is important to keep all your appointments. °· Limit alcohol intake to no more than 1 drink per day for nonpregnant women and 2 drinks per day for men. Drinking more than that is harmful to your heart. Tell your caregiver if you drink alcohol several times a week. Talk with your caregiver about whether alcohol is safe for you. If your heart has already been damaged by alcohol or you have severe heart failure, drinking alcohol should be stopped completely. °· Stop illicit drug use. °· Stay up-to-date with immunizations. It is especially important to prevent respiratory infections through current pneumococcal and influenza immunizations. °· Manage other health conditions such as hypertension, diabetes, thyroid disease, or abnormal heart rhythms as directed by your caregiver. °· Learn to manage stress. °· Plan rest periods when fatigued. °· Learn strategies to manage high temperatures. If the weather is extremely hot: °· Avoid vigorous physical activity. °· Use air conditioning or fans or seek a cooler location. °· Avoid caffeine and alcohol. °· Wear loose-fitting, lightweight, and light-colored clothing. °· Learn strategies to manage cold temperatures. If the weather is extremely cold: °· Avoid vigorous physical activity. °· Layer clothes. °· Wear mittens or gloves, a hat, and a scarf when going outside. °· Avoid alcohol. °· Obtain ongoing education and support as needed. °· Participate or seek rehabilitation as needed to maintain or improve independence and quality of life. °SEEK MEDICAL CARE IF:  °· Your weight increases by 03 lb/1.4 kg in 1 day or 05 lb/2.3 kg in a week. °· You have increasing shortness of breath that is unusual for you. °· You are unable to participate in your usual physical activities. °· You tire easily. °· You cough more than normal, especially with physical activity. °· You have any or more swelling in areas such as your hands, feet, ankles, or abdomen. °· You  are unable to sleep because it is hard to breathe. °· You feel like your heart is beating fast (palpitations). °· You become dizzy or lightheaded upon standing up. °SEEK IMMEDIATE MEDICAL CARE IF:  °· You have difficulty breathing. °· There is a change in mental status such as decreased alertness or difficulty with concentration. °· You have a pain or discomfort in your chest. °· You have an episode of fainting (syncope). °MAKE SURE YOU:  °· Understand these instructions. °· Will watch your condition. °· Will get help right away if you are not doing well or get worse. °Document Released: 03/17/2005 Document Revised: 07/12/2012 Document Reviewed: 04/08/2012 °[employer]® Patient Information ©2014 [employer]. ° °

## 2013-06-16 NOTE — Discharge Summary (Signed)
Physician Discharge Summary  DRAVIN LANCE XFG:[id number] DOB: 03-19-1931 DOA: 06/13/2013  PCP: [name], MD  Admit date: 06/13/2013 Discharge date: 06/16/2013  Recommendations for Outpatient Follow-up:  1. Pt will need to follow up with PCP in 2-3 weeks post discharge 2. Please obtain BMP to evaluate electrolytes and kidney function 3. Please also check CBC to evaluate Hg and Hct levels 4. Discharged on lasix 20 mg PO QD, weight on discharge 146 lbs  Discharge Diagnoses:  Active Problems:   Occlusion and stenosis of carotid artery without mention of cerebral infarction   Hypertension   PAD (peripheral artery disease)   Diabetes mellitus, type 2   Acute respiratory failure with hypoxia   Acute decompensated heart failure   Thrombocytopenia  Discharge Condition: Stable  Diet recommendation: Heart healthy diet discussed in details   Brief narrative:  Patient is 80 year old male with hypertension, diabetes type 2, chronic systolic congestive heart failure, recently admitted for management of acute on chronic systolic heart failure, discharged on ACE inhibitor and Lasix but due to worsening renal failure with hyperkalemia, PCP has discontinued both medicines and 3 days post discharge patient now readmitted for progressively worsening lower extremity edema and weight gain of several pounds. Of note, patient was also scheduled for an outpatient carotid angiogram 06/13/2013 to evaluate carotid stenosis detected during last admission.   Assessment/Plan:  Active Problems:  Acute respiratory failure with hypoxia/Acute decompensated heart failure (combined systolic and diastolic)  - suspect due to recent cessation of ACE I and Lasix (done 3 days after dc)  - Patient started on Lasix IV initially, weight on admission 165 pounds, responding well with following weight trend 165 lbs --> 161 lbs --> 147 --> 146 lbs this AM  - Continue Lasix, transition to by mouth, 40 mg twice a day and pt  has responded well  - 2-D echo 06/13/2013 with ejection fraction 96-78%, mild diastolic dysfunction  - Patient euvolemic on exam - pt will be discharged on Lasix 20 mg PO QD Acute renal failure  - Likely exacerbated by use of Lasix  - Patient now transitioned to Lasix by mouth, weight significantly down since admission, 19 pounds  - lowered the dose of Lasix upon discharge and will need to be closely followed up  Hypertension  - Reasonable inpatient control, continue Imdur, Corag, Cardizem  Diabetes mellitus, type 2  - HgbA1c 6.0  - continue SSI  Thrombocytopenia  - UA unremarkable and cx with no growth so doubt infectious  - No signs of active bleeding, repeat CBC in the morning  Occlusion and stenosis of carotid artery without mention of cerebral infarction  - Continue statin  Acute on chronic blood loss anemia  - Drop in hemoglobin since admission, no signs of active bleeding\\ PAD (peripheral artery disease)   DVT prophylaxis: SQ Heparin  Code Status: DO NOT RESUSCITATE  Family Communication: Son at the bedside   Consultants:  None  Procedures:  Bilateral lower extremity venous duplex Preliminary report negative for DVT  2-D echocardiogram Ejection fraction 55-60% with mild diastolic dysfunction Antibiotics:  None   Discharge Exam: Filed Vitals:   06/16/13 0504  BP: 102/61  Pulse: 81  Temp: 98.1 F (36.7 C)  Resp: 18   Filed Vitals:   06/15/13 2031 06/15/13 2042 06/16/13 0015 06/16/13 0504  BP: 98/50 98/50 128/56 102/61  Pulse: 63   81  Temp: 97.9 F (36.6 C)   98.1 F (36.7 C)  TempSrc: Oral   Oral  Resp: 18  18  Height:      Weight:    66.633 kg (146 lb 14.4 oz)  SpO2: 98%   95%    General: Pt is alert, follows commands appropriately, not in acute distress Cardiovascular: Regular rate and rhythm, S1/S2 +, no murmurs, no rubs, no gallops Respiratory: Clear to auscultation bilaterally, no wheezing, no crackles, no rhonchi Abdominal: Soft, non tender,  non distended, bowel sounds +, no guarding  Discharge Instructions  Discharge Orders   Future Orders Complete By Expires   Diet - low sodium heart healthy  As directed    Increase activity slowly  As directed        Medication List         aspirin EC 81 MG tablet  Take 81 mg by mouth every morning.     atorvastatin 40 MG tablet  Commonly known as:  LIPITOR  Take 20 mg by mouth every morning.     CARTIA XT 240 MG 24 hr capsule  Generic drug:  diltiazem  Take 240 mg by mouth daily.     carvedilol 25 MG tablet  Commonly known as:  COREG  Take 1 tablet (25 mg total) by mouth 2 (two) times daily with a meal.     doxazosin 2 MG tablet  Commonly known as:  CARDURA  Take 2 mg by mouth daily.     fluticasone 50 MCG/ACT nasal spray  Commonly known as:  FLONASE  Place 2 sprays into the nose daily.     furosemide 40 MG tablet  Commonly known as:  LASIX  Take 0.5 tablets (20 mg total) by mouth daily.     glimepiride 2 MG tablet  Commonly known as:  AMARYL  Take 2 mg by mouth daily before breakfast.     HYDROcodone-acetaminophen 5-325 MG per tablet  Commonly known as:  NORCO/VICODIN  Take 2 tablets by mouth every 6 (six) hours as needed for pain.     Iron 246 (28 FE) MG Tabs  Take 2 tablets by mouth daily.     isosorbide dinitrate 10 MG tablet  Commonly known as:  ISORDIL  Take 1 tablet (10 mg total) by mouth 3 (three) times daily.     loratadine 10 MG tablet  Commonly known as:  CLARITIN  Take 10 mg by mouth daily.     multivitamin capsule  Take 1 capsule by mouth daily.     niacin 500 MG tablet  Commonly known as:  SLO-NIACIN  Take 500 mg by mouth daily.     omega-3 acid ethyl esters 1 G capsule  Commonly known as:  LOVAZA  Take 2 g by mouth daily.     omeprazole 20 MG capsule  Commonly known as:  PRILOSEC  Take 20 mg by mouth daily.     temazepam 30 MG capsule  Commonly known as:  RESTORIL  Take 30 mg by mouth at bedtime as needed for sleep. For sleep      Vitamin D 2000 UNITS tablet  Take 2,000 Units by mouth daily.           Follow-up Information   Follow up with [name] A, MD In 1 week.   Specialty:  Internal Medicine   Contact information:   [address] [place] [address] [phone]       Schedule an appointment as soon as possible for a visit with Glori Bickers, MD.   Specialty:  Cardiology   Contact information:   [address]  [address] [place] [address] [phone]       Follow up with Faye Ramsay, MD. (As needed, If symptoms worsen call my cell phone [phone])    Specialty:  Internal Medicine   Contact information:   [address] [place] [address] [phone]        The results of significant diagnostics from this hospitalization (including imaging, microbiology, ancillary and laboratory) are listed below for reference.     Microbiology: Recent Results (from the past 240 hour(s))  MRSA PCR SCREENING     Status: None   Collection Time    06/13/13  7:39 AM      Result Value Ref Range Status   MRSA by PCR NEGATIVE  NEGATIVE Final   Comment:            The GeneXpert MRSA Assay (FDA     approved for NASAL specimens     only), is one component of a     comprehensive MRSA colonization     surveillance program. It is not     intended to diagnose MRSA     infection nor to guide or     monitor treatment for     MRSA infections.  URINE CULTURE     Status: None   Collection Time    06/13/13  2:00 PM      Result Value Ref Range Status   Specimen Description URINE, CLEAN CATCH   Final   Special Requests NONE   Final   Culture  Setup Time     Final   Value: 06/13/2013 14:33     Performed at [facility]     Final   Value: NO GROWTH     Performed at [facility]   Culture     Final   Value: NO GROWTH     Performed at [facility]   Report Status 06/14/2013 FINAL   Final     Labs: Basic Metabolic  Panel:  Recent Labs Lab 06/13/13 0404 06/13/13 1110 06/14/13 0311 06/15/13 0312 06/16/13 0530  NA 143 139 142 140 138  K 4.3 4.3 3.3* 4.0 4.2  CL 107 101 103 100 99  CO2 21 23 24 26 25   GLUCOSE 191* 186* 120* 142* 148*  BUN 23 25* 34* 42* 51*  CREATININE 1.11 1.09 1.50* 1.60* 1.74*  CALCIUM 9.1 9.3 9.0 9.2 9.1   Liver Function Tests:  Recent Labs Lab 06/13/13 0404  AST 21  ALT 12  ALKPHOS 90  BILITOT <0.2*  PROT 6.8  ALBUMIN 3.6   CBC:  Recent Labs Lab 06/13/13 0404 06/14/13 0311 06/16/13 0530  WBC 7.9 7.3 6.3  NEUTROABS 4.4  --   --   HGB 12.2* 10.0* 10.3*  HCT 35.9* 28.2* 30.1*  MCV 96.8 95.6 95.3  PLT 127* 105* 114*   Cardiac Enzymes:  Recent Labs Lab 06/13/13 0637 06/13/13 1228 06/13/13 1818  TROPONINI 0.58* 1.53* 1.46*   BNP: BNP (last 3 results)  Recent Labs  05/10/13 1855 06/13/13 0409  PROBNP 18246.0* 3921.0*   CBG:  Recent Labs Lab 06/15/13 1112 06/15/13 1648 06/15/13 2049 06/16/13 0616 06/16/13 1124  GLUCAP 144* 151* 126* 142* 163*     SIGNED: Time coordinating discharge: Over 30 minutes  Faye Ramsay, MD  [facility] 06/16/2013, 12:28 PM Pager [phone]  If 7PM-7AM, please contact night-coverage [email or web address] Password [email or web address]

## 2013-06-24 ENCOUNTER — Telehealth (HOSPITAL_COMMUNITY): Payer: Self-pay | Admitting: Surgery

## 2013-06-24 NOTE — Telephone Encounter (Signed)
I called [name] to see how he had been doing since discharge. He says that he has been doing well since discharge except one instance of increased weight and Dr. Joylene Draft instructed him to take extra Lasix. He had no follow -up appt scheduled at discharge form hospital.. His discharge note said that he needed to call [facility] for an appt and [facility] received a referral from Dr. Joylene Draft.  [name] has an appt. Wed. 06/22/13 at 10:30 am in [facility] clinic.  I gave his sitter directions to get to the clinic along with the phone number for any questions.

## 2013-06-29 ENCOUNTER — Encounter (HOSPITAL_COMMUNITY): Payer: Self-pay

## 2013-06-29 ENCOUNTER — Ambulatory Visit (HOSPITAL_COMMUNITY)
Admission: RE | Admit: 2013-06-29 | Discharge: 2013-06-29 | Disposition: A | Discharge status: Home / Self Care | Payer: Medicare Other | Source: Ambulatory Visit | Attending: Internal Medicine | Admitting: Internal Medicine

## 2013-06-29 VITALS — BP 112/58 | HR 75 | Wt 152.2 lb

## 2013-06-29 DIAGNOSIS — I5022 Chronic systolic (congestive) heart failure: Secondary | ICD-10-CM | POA: Insufficient documentation

## 2013-06-29 DIAGNOSIS — I10 Essential (primary) hypertension: Secondary | ICD-10-CM | POA: Insufficient documentation

## 2013-06-29 DIAGNOSIS — I739 Peripheral vascular disease, unspecified: Secondary | ICD-10-CM | POA: Insufficient documentation

## 2013-06-29 LAB — BASIC METABOLIC PANEL
BUN: 34 mg/dL — AB (ref 6–23)
CALCIUM: 9.5 mg/dL (ref 8.4–10.5)
CO2: 23 mEq/L (ref 19–32)
CREATININE: 1.38 mg/dL — AB (ref 0.50–1.35)
Chloride: 104 mEq/L (ref 96–112)
GFR, EST AFRICAN AMERICAN: 54 mL/min — AB (ref >90)
GFR, EST NON AFRICAN AMERICAN: 47 mL/min — AB (ref >90)
Glucose, Bld: 109 mg/dL — ABNORMAL HIGH (ref 70–99)
Potassium: 4.8 mEq/L (ref 3.7–5.3)
Sodium: 140 mEq/L (ref 137–147)

## 2013-06-29 LAB — CBC
HCT: 31.2 % — ABNORMAL LOW (ref 39.0–52.0)
Hemoglobin: 10.9 g/dL — ABNORMAL LOW (ref 13.0–17.0)
MCH: 32.7 pg (ref 26.0–34.0)
MCHC: 34.9 g/dL (ref 30.0–36.0)
MCV: 93.7 fL (ref 78.0–100.0)
Platelets: 109 10*3/uL — ABNORMAL LOW (ref 150–400)
RBC: 3.33 MIL/uL — ABNORMAL LOW (ref 4.22–5.81)
RDW: 12.2 % (ref 11.5–15.5)
WBC: 5 10*3/uL (ref 4.0–10.5)

## 2013-06-29 NOTE — Patient Instructions (Signed)
Doing great.  Call the [facility] if you start gaining weight or having any shortness of breath [phone].  Follow up with Dr. Oneida Alar office  Continue all medications as prescribed.  Follow up 3 weeks.  Do the following things EVERYDAY: 1) Weigh yourself in the morning before breakfast. Write it down and keep it in a log. 2) Take your medicines as prescribed 3) Eat low salt foods-Limit salt (sodium) to 2000 mg per day.  4) Stay as active as you can everyday 5) Limit all fluids for the day to less than 2 liters 6)

## 2013-06-29 NOTE — Progress Notes (Signed)
Referring Physician:   Primary Care: Dr. Joanie Coddington Primary Cardiologist: N/A Vascular: Dr. Oneida Alar  HPI: This is a 80 y.o. male with a past medical history significant for extensive PAD with left femoropopliteal bypass in 1999 preceded by right internal iliac stenting in 1997. He then has had extensive aortic arch and great vessel repair for a mycotic aortic aneurysm following a back surgery with MRSA seeding. 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 He has diabetes on oral medications as well as hypertension and hyperlipidemia.  Admitted 3/16/-3/19 for increased weight gain and worsening LE edema. ECHO showed EF 55-60% with mild diastolic dysfx. Discharge weight 146 lbs. Hospitalized in Feb 2015 with the flu and NSTEMI EF at that time 45%.   Follow up: Doing well. Weight at home 150.2 lbs. Denies SOB, PND, orthopnea or CP. + LE edema. Was able to walk from [facility] entrance to clinic with no SOB. Able to get around the grocery store without stopping. Following a low salt diet and trying to drink less than 2L a day. Taking medications as prescribed.   ROS: All systems negative except as listed in HPI, PMH and Problem List.  Past Medical History  Diagnosis Date  . Diabetes mellitus, type 2   . Hypertension   . GERD (gastroesophageal reflux disease)   . Osteoporosis, senile   . High cholesterol   . Seasonal allergies   . MRSA infection     s/p '02-no problems now  . Anemia   . Stroke   . Transfusion history     '02- back surgery  . PAD (peripheral artery disease)     s/p L Fem-Pop Bypass; R Iliac PTA  . Left carotid artery occlusion     moderate R carotid disease; L Common Carotid Bypassed during Arch Repair.  . H/O thoracic aortic aneurysm repair      Stent Graft (penetrating ulcer)  . Myocardial infarction Feb. 9, 2015    Mild Heart attack  . Flu Feb. 9, 2015    ?  Mild Heart Attack    Current Outpatient Prescriptions  Medication Sig Dispense Refill  . aspirin EC 81 MG tablet Take 81 mg by mouth every morning.       Marland Kitchen atorvastatin (LIPITOR) 40 MG tablet Take 20 mg by mouth every evening.       . carvedilol (COREG) 25 MG tablet Take 1 tablet (25 mg total) by mouth 2 (two) times daily with a meal.  60 tablet  2  . Cholecalciferol (VITAMIN D) 2000 UNITS tablet Take 2,000 Units by mouth daily.      Marland Kitchen diltiazem (CARTIA XT) 240 MG 24 hr capsule Take 240 mg by mouth daily.      . diphenhydrAMINE (BENADRYL) 25 MG tablet Take 25 mg by mouth at bedtime.      Marland Kitchen doxazosin (CARDURA) 2 MG tablet Take 2 mg by mouth daily.       . Ferrous Gluconate (IRON) 246 (28 FE) MG TABS Take 2 tablets by mouth daily.      . fluticasone (FLONASE) 50 MCG/ACT nasal spray Place 2 sprays into the nose daily.      . furosemide (LASIX) 40 MG tablet Take 20 mg by mouth every other day.      Marland Kitchen  glimepiride (AMARYL) 2 MG tablet Take 2 mg by mouth daily before breakfast.      . HYDROcodone-acetaminophen (NORCO/VICODIN) 5-325 MG per tablet Take 2 tablets by mouth every 6 (six) hours as needed for pain.       . isosorbide dinitrate (ISORDIL) 10 MG tablet Take 1 tablet (10 mg total) by mouth 3 (three) times daily.  90 tablet  2  . loratadine (CLARITIN) 10 MG tablet Take 10 mg by mouth daily.      . Multiple Vitamin (MULTIVITAMIN) capsule Take 1 capsule by mouth daily.      . niacin (SLO-NIACIN) 500 MG tablet Take 500 mg by mouth daily.       Marland Kitchen omega-3 acid ethyl esters (LOVAZA) 1 G capsule Take 2 g by mouth daily.      Marland Kitchen omeprazole (PRILOSEC) 20 MG capsule Take 20 mg by mouth daily.      . temazepam (RESTORIL) 30 MG capsule Take 30 mg by mouth at bedtime as needed for sleep. For sleep       No current facility-administered medications for this encounter.    Filed Vitals:    06/29/13 1031  BP: 112/58  Pulse: 75  Weight: 152 lb 3.2 oz (69.037 kg)  SpO2: 99%   PHYSICAL EXAM: General:  Chronically ill appearing. No resp difficulty HEENT: normal Neck: supple. JVP flat. Carotids 2+ bilaterally; no bruits. No lymphadenopathy or thryomegaly appreciated. Cor: PMI normal. Regular rate & rhythm. No rubs, gallops or murmurs. Lungs: clear Abdomen: soft, nontender, nondistended. No hepatosplenomegaly. No bruits or masses. Good bowel sounds. Extremities: no cyanosis, clubbing, rash, edema Neuro: alert & orientedx3, cranial nerves grossly intact. Moves all 4 extremities w/o difficulty. Affect pleasant; speech impediment  ASSESSMENT & PLAN:  1) Chronic systolic HF: EF 52-84% (03/3242) - Recently admitted to the hospital for weight gain and LE edema. Reviewed DC summary and diuresed down ~12 lbs with IV lasix. Discharge weight 146 lbs. Repeat ECHO showed improvement in EF to 50-55%. Had recent AKI and ACE-I stopped. - NYHA II symptoms and volume status stable. Will continue lasix 20 mg QOD.  - On goal dose BB coreg 25 mg BID. - Will not start ACE-I currently, BP stable and will draw BMET to assess Cr. - Reinforced the need and importance of daily weights, a low sodium diet, and fluid restriction (less than 2 L a day). Instructed to call the HF clinic if weight increases more than 3 lbs overnight or 5 lbs in a week.  2) PAD - Before being admitted was scheduled for cerebral arteriogram, however missed d/t being in the hospital. Instructed to follow up. 3) HTN - Stable  Follow up with PCP. Patient would like to follow up with son's cardiologist Dr. Wynonia Lawman. Will see one more time in 3 weeks and then refer to [place].  Rande Brunt

## 2013-07-01 DIAGNOSIS — I5022 Chronic systolic (congestive) heart failure: Secondary | ICD-10-CM | POA: Insufficient documentation

## 2013-07-20 ENCOUNTER — Encounter (HOSPITAL_COMMUNITY): Payer: Medicare Other

## 2013-07-20 ENCOUNTER — Encounter (HOSPITAL_COMMUNITY): Payer: Self-pay

## 2013-07-20 ENCOUNTER — Ambulatory Visit (HOSPITAL_COMMUNITY)
Admission: RE | Admit: 2013-07-20 | Discharge: 2013-07-20 | Disposition: A | Discharge status: Home / Self Care | Payer: Medicare Other | Source: Ambulatory Visit | Attending: Internal Medicine | Admitting: Internal Medicine

## 2013-07-20 VITALS — BP 111/64 | HR 71 | Resp 18 | Wt 150.0 lb

## 2013-07-20 DIAGNOSIS — I5022 Chronic systolic (congestive) heart failure: Secondary | ICD-10-CM

## 2013-07-20 DIAGNOSIS — I10 Essential (primary) hypertension: Secondary | ICD-10-CM

## 2013-07-20 DIAGNOSIS — E7800 Pure hypercholesterolemia, unspecified: Secondary | ICD-10-CM

## 2013-07-20 DIAGNOSIS — I739 Peripheral vascular disease, unspecified: Secondary | ICD-10-CM

## 2013-07-20 NOTE — Patient Instructions (Signed)
Doing great.  Follow up in 2 months.  Call if you have any issues.  Do the following things EVERYDAY: 1) Weigh yourself in the morning before breakfast. Write it down and keep it in a log. 2) Take your medicines as prescribed 3) Eat low salt foods-Limit salt (sodium) to 2000 mg per day.  4) Stay as active as you can everyday 5) Limit all fluids for the day to less than 2 liters 6)

## 2013-07-20 NOTE — Progress Notes (Signed)
Patient ID: TRAJAN GROVE, male   DOB: 1930-08-07, 80 y.o.   MRN: [id number] Referring Physician:   Primary Care: Dr. Joanie Coddington Primary Cardiologist: N/A Vascular: Dr. Oneida Alar  HPI: This is a 80 y.o. male with a past medical history significant for extensive PAD with left femoropopliteal bypass in 1999 preceded by right internal iliac stenting in 1997. He then has had extensive aortic arch and great vessel repair for a mycotic aortic aneurysm following a back surgery with MRSA seeding. 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 He has diabetes on oral medications as well as hypertension and hyperlipidemia.  Admitted 3/16/-06/16/13 for increased weight gain and worsening LE edema. ECHO showed EF 55-60% with mild diastolic dysfx. Discharge weight 146 lbs. Hospitalized in Feb 2015 with the flu and NSTEMI EF at that time 45%.   Follow up: Feels good. Denies SOB, PND or CP. Sleeps on 2 pillows for comfort. Mild LE edema. Able to go around the grocery store without any issues. Weight at home 147-148 lbs. Taking all of his medicatios. Following a low salt diet and drinking less than 2L a day.  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   Labs:  (06/29/13) K 4.8, Cr 1.38  ROS: All systems negative except as listed in HPI, PMH and Problem List.  Past Medical History  Diagnosis Date  . Diabetes mellitus, type 2   . Hypertension   . GERD (gastroesophageal reflux disease)   . Osteoporosis, senile   . High cholesterol   . Seasonal allergies   . MRSA infection     s/p '02-no problems now  . Anemia   . Stroke   . Transfusion history     '02- back surgery  . PAD (peripheral artery disease)     s/p L Fem-Pop Bypass; R Iliac PTA  . Left carotid artery occlusion    moderate R carotid disease; L Common Carotid Bypassed during Arch Repair.  . H/O thoracic aortic aneurysm repair     Stent Graft (penetrating ulcer)  . Myocardial infarction Feb. 9, 2015    Mild Heart attack  . Flu Feb. 9, 2015    ?  Mild Heart Attack    Current Outpatient Prescriptions  Medication Sig Dispense Refill  . aspirin EC 81 MG tablet Take 81 mg by mouth every morning.       Marland Kitchen atorvastatin (LIPITOR) 40 MG tablet Take 20 mg by mouth every evening.       . carvedilol (COREG) 25 MG tablet Take 1 tablet (25 mg total) by mouth 2 (two) times daily with a meal.  60 tablet  2  . Cholecalciferol (VITAMIN D) 2000 UNITS tablet Take 2,000 Units by mouth daily.      Marland Kitchen diltiazem (CARTIA XT) 240 MG 24 hr capsule Take 240 mg by mouth daily.      . diphenhydrAMINE (BENADRYL) 25 MG tablet Take 25 mg by mouth at bedtime.      Marland Kitchen doxazosin (CARDURA) 2 MG tablet Take 2 mg by mouth daily.       . Ferrous Gluconate (IRON) 246 (28 FE) MG TABS Take 2 tablets by mouth daily.      . fluticasone (FLONASE) 50 MCG/ACT nasal spray Place 2 sprays into the nose daily.      Marland Kitchen  furosemide (LASIX) 40 MG tablet Take 20 mg by mouth every other day.      Marland Kitchen glimepiride (AMARYL) 2 MG tablet Take 2 mg by mouth daily before breakfast.      . HYDROcodone-acetaminophen (NORCO/VICODIN) 5-325 MG per tablet Take 2 tablets by mouth every 6 (six) hours as needed for pain.       . isosorbide dinitrate (ISORDIL) 10 MG tablet Take 1 tablet (10 mg total) by mouth 3 (three) times daily.  90 tablet  2  . loratadine (CLARITIN) 10 MG tablet Take 10 mg by mouth daily.      . Multiple Vitamin (MULTIVITAMIN) capsule Take 1 capsule by mouth daily.      . niacin (SLO-NIACIN) 500 MG tablet Take 500 mg by mouth daily.       . Omega-3 Fatty Acids (FISH OIL) 1200 MG CAPS Take 1 capsule by mouth every morning.      Marland Kitchen omeprazole (PRILOSEC) 20 MG capsule Take 20 mg by mouth daily.      . temazepam (RESTORIL) 30 MG capsule Take 30 mg by mouth at  bedtime as needed for sleep. For sleep       No current facility-administered medications for this encounter.    Filed Vitals:   07/20/13 1343  BP: 111/64  Pulse: 71  Resp: 18  Weight: 150 lb (68.04 kg)  SpO2: 99%   PHYSICAL EXAM: General:  Chronically ill appearing. No resp difficulty HEENT: normal Neck: supple. JVP flat. Carotids 2+ bilaterally; no bruits. No lymphadenopathy or thryomegaly appreciated. Cor: PMI normal. Regular rate & rhythm. No rubs, gallops or murmurs. Lungs: clear Abdomen: soft, nontender, nondistended. No hepatosplenomegaly. No bruits or masses. Good bowel sounds. Extremities: no cyanosis, clubbing, rash, edema Neuro: alert & orientedx3, cranial nerves grossly intact. Moves all 4 extremities w/o difficulty. Affect pleasant; speech impediment  ASSESSMENT & PLAN:  1) Chronic systolic HF: EF 48-18% (07/6312) - Recently admitted to the hospital for weight gain and LE edema. Discharge weight 146 lbs. Repeat ECHO showed improvement in EF to 50-55%. Had recent AKI and ACE-I stopped. - Continues with NYHA II symptoms and volume status stable. Will continue lasix 20 mg QOD. We discussed the use of sliding scale diuretics. - On goal dose BB coreg 25 mg BID. - Not currently on ACE-I was stopped in the hospital in 05/2013 with AKI. Will continue to hold off and consider starting next visit low dose.  - Reinforced the need and importance of daily weights, a low sodium diet, and fluid restriction (less than 2 L a day). Instructed to call the HF clinic if weight increases more than 3 lbs overnight or 5 lbs in a week.  2) PAD - Follows with Dr. Oneida Alar. Denies any claudication pain. - continue ASA and statin.  3) HTN - Stable. Continue current medications.  4) HLD - Managed by PCP.  F/U 2 months with MD Rande Brunt NP-C 2:17 PM

## 2013-08-16 ENCOUNTER — Encounter (INDEPENDENT_AMBULATORY_CARE_PROVIDER_SITE_OTHER): Payer: Self-pay | Admitting: Surgery

## 2013-08-16 ENCOUNTER — Ambulatory Visit (INDEPENDENT_AMBULATORY_CARE_PROVIDER_SITE_OTHER): Payer: Medicare Other | Admitting: Surgery

## 2013-08-16 VITALS — BP 170/80 | HR 84 | Temp 97.7°F | Resp 16 | Ht 65.0 in | Wt 149.4 lb

## 2013-08-16 DIAGNOSIS — N5089 Other specified disorders of the male genital organs: Secondary | ICD-10-CM

## 2013-08-16 NOTE — Progress Notes (Signed)
Subjective:     Patient ID: [name], male   DOB: Oct 14, 1930, 80 y.o.   MRN: [id number]  HPI He is here for a follow up visit.  He had and infected sebaceous cyst drained on the right side of his scrotum in January.  He was concerned about scrotal swelling that has persisted.  He has no pain  Review of Systems     Objective:   Physical Exam His testicle on the right is slightly enlarged compared to the left.  He may have a varicocele.  The sight of the sebaceous cyst is well healed without recurrence    Assessment:     Asymptomatic scrotal swelling     Plan:     I do not think there is anything pathologic going on.  We could always get an ultrasound but I don't think this is currently necessary.  Will defer to his primary care physician.  I will see him PRN

## 2013-08-17 ENCOUNTER — Encounter: Payer: Self-pay | Admitting: Vascular Surgery

## 2013-08-18 ENCOUNTER — Ambulatory Visit (INDEPENDENT_AMBULATORY_CARE_PROVIDER_SITE_OTHER): Payer: Medicare Other | Admitting: Vascular Surgery

## 2013-08-18 ENCOUNTER — Encounter: Payer: Self-pay | Admitting: Vascular Surgery

## 2013-08-18 ENCOUNTER — Ambulatory Visit: Payer: Medicare Other | Admitting: Vascular Surgery

## 2013-08-18 VITALS — BP 136/51 | HR 63 | Ht 65.0 in | Wt 150.0 lb

## 2013-08-18 DIAGNOSIS — I771 Stricture of artery: Secondary | ICD-10-CM

## 2013-08-18 DIAGNOSIS — I739 Peripheral vascular disease, unspecified: Secondary | ICD-10-CM | POA: Insufficient documentation

## 2013-08-18 NOTE — Addendum Note (Signed)
Addended by: Peter Minium K on: 08/18/2013 04:00 PM   Modules accepted: Orders

## 2013-08-18 NOTE — Progress Notes (Signed)
Patient is an 80 year old male who returns for followup today. He was recently scheduled for a carotid angiogram to evaluate multi-great vessel arterial occlusive disease. However, this was canceled due to congestive heart failure. The patient denies any symptoms of TIA amaurosis or stroke. He had a innominate and left common carotid artery bypass by Dr. Amedeo Plenty in 2002. The left common carotid artery has been occluded since 2012 documented by CT scan. The patient recently had a duplex scan suggesting bilateral subclavian stenosis. His congestive heart failure is currently compensated. However, the patient does not feel like he is up any other procedures currently. Of note he has previously also had a left femoropopliteal bypass in 1998 which revealed revised in 1999. He had a right external iliac artery angioplasty in 1997. He also had a thoracic stent graft placed for a penetrating thoracic aortic ulcer at [facility] in 2003.  Review of systems: He has shortness of breath with exertion. He denies claudication.  Current Outpatient Prescriptions on File Prior to Visit  Medication Sig Dispense Refill  . aspirin EC 81 MG tablet Take 81 mg by mouth every morning.       Marland Kitchen atorvastatin (LIPITOR) 40 MG tablet Take 20 mg by mouth every evening.       . carvedilol (COREG) 25 MG tablet Take 1 tablet (25 mg total) by mouth 2 (two) times daily with a meal.  60 tablet  2  . Cholecalciferol (VITAMIN D) 2000 UNITS tablet Take 2,000 Units by mouth daily.      Marland Kitchen diltiazem (CARTIA XT) 240 MG 24 hr capsule Take 240 mg by mouth daily.      . diphenhydrAMINE (BENADRYL) 25 MG tablet Take 25 mg by mouth at bedtime.      Marland Kitchen doxazosin (CARDURA) 2 MG tablet Take 2 mg by mouth daily.       . Ferrous Gluconate (IRON) 246 (28 FE) MG TABS Take 2 tablets by mouth daily.      . fluticasone (FLONASE) 50 MCG/ACT nasal spray Place 2 sprays into the nose daily.      . furosemide (LASIX) 40 MG tablet Take 20 mg by mouth every other day.      Marland Kitchen  glimepiride (AMARYL) 2 MG tablet Take 2 mg by mouth daily before breakfast.      . HYDROcodone-acetaminophen (NORCO/VICODIN) 5-325 MG per tablet Take 2 tablets by mouth every 6 (six) hours as needed for pain.       . isosorbide dinitrate (ISORDIL) 10 MG tablet Take 1 tablet (10 mg total) by mouth 3 (three) times daily.  90 tablet  2  . loratadine (CLARITIN) 10 MG tablet Take 10 mg by mouth daily.      . Multiple Vitamin (MULTIVITAMIN) capsule Take 1 capsule by mouth daily.      . niacin (SLO-NIACIN) 500 MG tablet Take 500 mg by mouth daily.       . Omega-3 Fatty Acids (FISH OIL) 1200 MG CAPS Take 1 capsule by mouth every morning.      Marland Kitchen omeprazole (PRILOSEC) 20 MG capsule Take 20 mg by mouth daily.      . temazepam (RESTORIL) 30 MG capsule Take 30 mg by mouth at bedtime as needed for sleep. For sleep       No current facility-administered medications on file prior to visit.    Physical exam:  Filed Vitals:   08/18/13 1401  BP: 136/51  Pulse: 63  Height: 5\' 5"  (1.651 m)  Weight: 150 lb (68.04  kg)  SpO2: 100%    Neck: Absent left carotid pulse, no carotid bruits  Chest: Clear to auscultation bilaterally  Cardiac: Regular rate and rhythm without murmur  Abdomen: Soft nontender  Extremities: Nonpalpable pedal pulses no ulceration feet pink and warm, 2+ femoral pulses  Assessment: Patient with known multivessel great vessel disease. Has evidence of bilateral subclavian stenosis. The left common carotid occlusion is chronic. This is all asymptomatic. The patient recently had an episode of congestive heart failure and currently does not feel up to any procedures for further evaluation.   Plan:  Followup with me in 6 months time with repeat CT Angio the neck. If his creatinine is too elevated we will consider whether or not the duplex scanner arteriogram at that time.  Ruta Hinds, MD [facility] [place] Office: [phone] Pager: [phone]

## 2013-08-25 ENCOUNTER — Encounter: Payer: Self-pay | Admitting: *Deleted

## 2013-08-29 ENCOUNTER — Encounter: Payer: Self-pay | Admitting: Internal Medicine

## 2013-08-30 ENCOUNTER — Ambulatory Visit: Payer: Medicare Other | Admitting: Internal Medicine

## 2013-09-19 ENCOUNTER — Ambulatory Visit (HOSPITAL_COMMUNITY)
Admission: RE | Admit: 2013-09-19 | Discharge: 2013-09-19 | Disposition: A | Discharge status: Home / Self Care | Payer: Medicare Other | Source: Ambulatory Visit | Attending: Internal Medicine | Admitting: Internal Medicine

## 2013-09-19 ENCOUNTER — Encounter (HOSPITAL_COMMUNITY): Payer: Self-pay

## 2013-09-19 VITALS — BP 134/76 | Wt 149.8 lb

## 2013-09-19 DIAGNOSIS — I5022 Chronic systolic (congestive) heart failure: Secondary | ICD-10-CM | POA: Diagnosis present

## 2013-09-19 NOTE — Patient Instructions (Signed)
Please take extra Lasix if weight is greater than 150 pounds

## 2013-09-19 NOTE — Addendum Note (Signed)
Encounter addended by: Micki Riley, RN on: 09/19/2013  2:51 PM<BR>     Documentation filed: Patient Instructions Section

## 2013-09-19 NOTE — Progress Notes (Signed)
Patient ID: [name], male   DOB: 1930-08-21, 80 y.o.   MRN: [id number] Referring Physician:   Primary Care: Dr. Joanie Coddington Primary Cardiologist: N/A Vascular: Dr. Oneida Alar  HPI: This is a 80 y.o. male with a past medical history significant for chronic diastolic HF (EF 00-86% in 3/15), presumed CAD, extensive PAD with left femoropopliteal bypass in 1999 preceded by right internal iliac stenting in 1997. He then has had extensive aortic arch and great vessel repair for a mycotic aortic aneurysm following a back surgery with MRSA seeding. 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 He has diabetes on oral medications as well as hypertension and hyperlipidemia.  Admitted 3/16/-06/16/13 for increased weight gain and worsening LE edema. ECHO showed EF 55-60% with mild diastolic dysfx. Discharge weight 146 lbs. Hospitalized in Feb 2015 with the flu and NSTEMI EF at that time 45%.   Follow up: Feels good. Denies SOB, PND or CP. Weight very stable at 147-148. Takes lasix every other day. Sleeps on 2 pillows for comfort. Mild LE edema. Able to go around the grocery store with walker or cane without any issues. Taking all of his medicatios. Following a low salt diet and drinking less than 2L a day. Saw Dr. Oneida Alar recently and PAD stable.   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   Labs:  (06/29/13) K 4.8, Cr 1.38  ROS: All systems negative except as listed in HPI, PMH and Problem List.  Past Medical History  Diagnosis Date  . Diabetes mellitus, type 2   . Hypertension   . GERD (gastroesophageal reflux disease)   . Osteoporosis, senile   . High cholesterol   . Seasonal allergies   . MRSA infection     s/p '02-no problems now  . Anemia   . Stroke   . Transfusion  history     '02- back surgery  . PAD (peripheral artery disease)     s/p L Fem-Pop Bypass; R Iliac PTA  . Left carotid artery occlusion     moderate R carotid disease; L Common Carotid Bypassed during Arch Repair.  . H/O thoracic aortic aneurysm repair     Stent Graft (penetrating ulcer) - r/t staphylococcal infection  . Myocardial infarction Feb. 9, 2015    Mild Heart attack  . Flu Feb. 9, 2015    ?  Mild Heart Attack  . CHF (congestive heart failure) 05/2013  . History of tobacco use     50 pack year history, quit in 2002    Current Outpatient Prescriptions  Medication Sig Dispense Refill  . aspirin EC 81 MG tablet Take 81 mg by mouth every morning.       Marland Kitchen atorvastatin (LIPITOR) 40 MG tablet Take 20 mg by mouth every evening.       . carvedilol (COREG) 25 MG tablet Take 1 tablet (25 mg total) by mouth 2 (two) times daily with a meal.  60 tablet  2  . Cholecalciferol (VITAMIN D) 2000 UNITS tablet Take 2,000 Units by mouth daily.      Marland Kitchen diltiazem (CARTIA XT) 240 MG 24 hr capsule Take 240 mg by mouth daily.      . diphenhydrAMINE (BENADRYL) 25 MG tablet Take 25 mg by mouth at bedtime.      Marland Kitchen  doxazosin (CARDURA) 2 MG tablet Take 2 mg by mouth daily.       . Ferrous Gluconate (IRON) 246 (28 FE) MG TABS Take 2 tablets by mouth daily.      . fluticasone (FLONASE) 50 MCG/ACT nasal spray Place 2 sprays into the nose daily.      . furosemide (LASIX) 40 MG tablet Take 20 mg by mouth daily.       Marland Kitchen glimepiride (AMARYL) 2 MG tablet Take 2 mg by mouth daily before breakfast.      . HYDROcodone-acetaminophen (NORCO/VICODIN) 5-325 MG per tablet Take 2 tablets by mouth every 6 (six) hours as needed for pain.       . isosorbide dinitrate (ISORDIL) 10 MG tablet Take 1 tablet (10 mg total) by mouth 3 (three) times daily.  90 tablet  2  . loratadine (CLARITIN) 10 MG tablet Take 10 mg by mouth daily.      . Multiple Vitamin (MULTIVITAMIN) capsule Take 1 capsule by mouth daily.      . niacin  (SLO-NIACIN) 500 MG tablet Take 500 mg by mouth daily.       . Omega-3 Fatty Acids (FISH OIL) 1200 MG CAPS Take 1 capsule by mouth every morning.      Marland Kitchen omeprazole (PRILOSEC) 20 MG capsule Take 20 mg by mouth daily.      . temazepam (RESTORIL) 30 MG capsule Take 30 mg by mouth at bedtime as needed for sleep. For sleep       No current facility-administered medications for this encounter.    Filed Vitals:   09/19/13 1357  BP: 134/76  Weight: 149 lb 12.8 oz (67.949 kg)   PHYSICAL EXAM: General:  Elderly. No resp difficulty HEENT: normal Neck: supple. JVP flat. Carotids 2+ bilaterally; no bruits. No lymphadenopathy or thryomegaly appreciated.  Cor: PMI normal. Regular rate & rhythm. No rubs, gallops or 2/6 SEM RUSB s2 normal  Lungs: clear but markedly decreased throughout Abdomen: soft, nontender, nondistended. No hepatosplenomegaly. No bruits or masses. Good bowel sounds. Extremities: no cyanosis, clubbing, rash, edema Neuro: alert & orientedx3, cranial nerves grossly intact. Moves all 4 extremities w/o difficulty. Affect pleasant; speech impediment  ASSESSMENT & PLAN:  1) Chronic systolic HF: EF 84-16% (08/628) - Recently admitted to the hospital for weight gain and LE edema. Discharge weight 146 lbs. Repeat ECHO showed improvement in EF to 55-60%. Had recent AKI and ACE-I stopped. - Continues with NYHA II symptoms and volume status stable. Will continue lasix 20 mg QOD. We discussed the use of sliding scale diuretics - take extra lasix for weight greater than 150.  - On goal dose BB coreg 25 mg BID. - Not currently on ACE-I was stopped in the hospital in 05/2013 with AKI. I will not restart at this time as BP and EF are ok.  - Reinforced the need and importance of daily weights, a low sodium diet, and fluid restriction (less than 2 L a day). Instructed to call the HF clinic if weight increases more than 3 lbs overnight or 5 lbs in a week.  2) PAD - Follows with Dr. Oneida Alar. Denies any  claudication pain. - continue ASA and statin.  3) HTN - Stable. Continue current medications.  4) HLD - Managed by PCP. 5) Probable CAD  - Asymptomatic. Continue RF management. He has f/u with Dr. Debara Pickett 6) Probable COPD on exam  - Refer to Pulmonary as needed.   RTC in 4 months.  Glori Bickers MD  2:35  PM   

## 2013-09-27 ENCOUNTER — Ambulatory Visit (INDEPENDENT_AMBULATORY_CARE_PROVIDER_SITE_OTHER): Payer: Medicare Other | Admitting: Internal Medicine

## 2013-09-27 ENCOUNTER — Encounter: Payer: Self-pay | Admitting: Internal Medicine

## 2013-09-27 ENCOUNTER — Telehealth: Payer: Self-pay | Admitting: Internal Medicine

## 2013-09-27 VITALS — BP 170/70 | HR 82 | Ht 64.0 in | Wt 147.9 lb

## 2013-09-27 DIAGNOSIS — I214 Non-ST elevation (NSTEMI) myocardial infarction: Secondary | ICD-10-CM

## 2013-09-27 DIAGNOSIS — I6529 Occlusion and stenosis of unspecified carotid artery: Secondary | ICD-10-CM

## 2013-09-27 DIAGNOSIS — Z8673 Personal history of transient ischemic attack (TIA), and cerebral infarction without residual deficits: Secondary | ICD-10-CM

## 2013-09-27 DIAGNOSIS — E7800 Pure hypercholesterolemia, unspecified: Secondary | ICD-10-CM

## 2013-09-27 DIAGNOSIS — I739 Peripheral vascular disease, unspecified: Secondary | ICD-10-CM

## 2013-09-27 DIAGNOSIS — I10 Essential (primary) hypertension: Secondary | ICD-10-CM

## 2013-09-27 DIAGNOSIS — I5022 Chronic systolic (congestive) heart failure: Secondary | ICD-10-CM

## 2013-09-27 NOTE — Telephone Encounter (Signed)
Jenna notified of patients carvedilol dose.  She changed it in his chart and will notify Dr. Debara Pickett.   Patient told we will call him back if Dr. Debara Pickett wants to change his current dose of 6.25mg  bid.  Patient voiced understanding.

## 2013-09-27 NOTE — Progress Notes (Signed)
OFFICE NOTE  Chief Complaint:  Re-establish care  Primary Care Physician: [name], MD  HPI:  [name] is an 80 year old male that I had previously seen in 2011. 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 He underwent stent grafting and did well from that. He has as history of hypertension and aortic sclerosis. Since I saw him last, he has had a number of events including chronic diastolic HF (EF 08-65% in 3/15), presumed CAD, extensive PAD with left femoropopliteal bypass in 1999 preceded by right internal iliac stenting in 1997. 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 He said he also had stent grafting of a penetrating ulcer in the descending and thoracic aorta (done at [facility], 2003). He has diabetes on oral medications as well as hypertension and hyperlipidemia.   He was recently admitted 3/16/-06/16/13 for increased weight gain and worsening LE edema. ECHO showed EF 55-60% with mild diastolic dysfx. Discharge weight 146 lbs. Hospitalized in Feb 2015 with the flu and NSTEMI EF at that time 45%. He was seen last week by Dr. Haroldine Laws in the heart failure clinic for followup. It was noted that he was euvolemic and was maintaining his dry weight without worsening shortness of breath. He was subsequently referred back to me for followup.  He has no complaints today.  PMHx:  Past Medical History  Diagnosis Date  . Diabetes mellitus, type 2   . Hypertension   . GERD (gastroesophageal reflux disease)   . Osteoporosis, senile   . High cholesterol   . Seasonal allergies   . MRSA infection     s/p '02-no problems now  . Anemia   . Stroke   . Transfusion history     '02- back surgery  . PAD (peripheral artery disease)     s/p L Fem-Pop Bypass; R Iliac PTA  . Left carotid artery occlusion     moderate R  carotid disease; L Common Carotid Bypassed during Arch Repair.  . H/O thoracic aortic aneurysm repair     Stent Graft (penetrating ulcer) - r/t staphylococcal infection  . Myocardial infarction Feb. 9, 2015    Mild Heart attack  . Flu Feb. 9, 2015    ?  Mild Heart Attack  . CHF (congestive heart failure) 05/2013  . History of tobacco use     50 pack year history, quit in 2002    Past Surgical History  Procedure Laterality Date  . Back surgery  2001  . Cleft lip repair    . Pr vein bypass graft,aorto-fem-pop  1999  . Joint replacement Right 2003    right hip   . Colonoscopy with propofol N/A 08/10/2012    Procedure: COLONOSCOPY WITH PROPOFOL;  Surgeon: Garlan Fair, MD;  Location: [facility];  Service: Endoscopy;  Laterality: N/A;  . Esophagogastroduodenoscopy (egd) with propofol N/A 08/10/2012    Procedure: ESOPHAGOGASTRODUODENOSCOPY (EGD) WITH PROPOFOL;  Surgeon: Garlan Fair, MD;  Location: [facility];  Service: Endoscopy;  Laterality: N/A;  . Iliac artery stent Right     PTA -STENT  . 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 Hemiarthroplasty hip Right   . Thoracic  aortic endovascular stent graft  2003    @ [facility]  - for Penetrating Ulcer/Aneurysm (cardiac cath done 01/08/2001 by Dr. Antionette Poles)  . Vascular surgery    . Transthoracic echocardiogram  10/2009    EF=>55%, LA mildly dilated, mild mitral annular calfication, mild MR, mild TR, RVSP 30-48mmHg, AV mildly sclerotic, trace pulm valve regurg    FAMHx:  Family History  Problem Relation Age of Onset  . Diabetes Mother   . Hypertension Mother     heart disese, died of MI at 47  . Hyperlipidemia Mother   . Diabetes Sister     also Alzheimers  . Heart disease Son     Heart Disease before age 100  . Heart disease Father     CHF  . Heart attack Father   .  Arthritis Father   . Marfan syndrome Daughter   . AAA (abdominal aortic aneurysm) Son     repair in 65s    SOCHx:   reports that he quit smoking about 13 years ago. He has never used smokeless tobacco. He reports that he does not drink alcohol or use illicit drugs.  ALLERGIES:  Allergies  Allergen Reactions  . Codeine Nausea And Vomiting    ROS: A comprehensive review of systems was negative.  HOME MEDS: Current Outpatient Prescriptions  Medication Sig Dispense Refill  . aspirin EC 81 MG tablet Take 81 mg by mouth every morning.       Marland Kitchen atorvastatin (LIPITOR) 40 MG tablet Take 20 mg by mouth every evening.       . carvedilol (COREG) 6.25 MG tablet Take 6.25 mg by mouth 2 (two) times daily with a meal.      . Cholecalciferol (VITAMIN D) 2000 UNITS tablet Take 2,000 Units by mouth daily.      Marland Kitchen diltiazem (CARTIA XT) 240 MG 24 hr capsule Take 240 mg by mouth daily.      . diphenhydrAMINE (BENADRYL) 25 MG tablet Take 25 mg by mouth at bedtime.      Marland Kitchen doxazosin (CARDURA) 2 MG tablet Take 2 mg by mouth daily.       . Ferrous Gluconate (IRON) 246 (28 FE) MG TABS Take 2 tablets by mouth daily.      . fluticasone (FLONASE) 50 MCG/ACT nasal spray Place 2 sprays into the nose daily.      . furosemide (LASIX) 40 MG tablet Take 20 mg by mouth daily.       Marland Kitchen glimepiride (AMARYL) 2 MG tablet Take 2 mg by mouth daily before breakfast.      . HYDROcodone-acetaminophen (NORCO/VICODIN) 5-325 MG per tablet Take 2 tablets by mouth every 6 (six) hours as needed for pain.       . isosorbide dinitrate (ISORDIL) 10 MG tablet Take 1 tablet (10 mg total) by mouth 3 (three) times daily.  90 tablet  2  . loratadine (CLARITIN) 10 MG tablet Take 10 mg by mouth daily.      . Multiple Vitamin (MULTIVITAMIN) capsule Take 1 capsule by mouth daily.      . niacin (SLO-NIACIN) 500 MG tablet Take 500 mg by mouth daily.       . Omega-3 Fatty Acids (FISH OIL) 1200 MG CAPS Take 1 capsule by mouth every morning.      Marland Kitchen  omeprazole (PRILOSEC) 20 MG capsule Take 20 mg by mouth daily.      . polyethylene glycol (MIRALAX / GLYCOLAX) packet Take 17 g by mouth daily.      Marland Kitchen  temazepam (RESTORIL) 30 MG capsule Take 30 mg by mouth at bedtime as needed for sleep. For sleep       No current facility-administered medications for this visit.    LABS/IMAGING: No results found for this or any previous visit (from the past 48 hour(s)). No results found.  VITALS: BP 170/70  Pulse 82  Ht 5\' 4"  (1.626 m)  Wt 147 lb 14.4 oz (67.087 kg)  BMI 25.37 kg/m2  EXAM: General appearance: alert and no distress Neck: no JVD and bilateral carotid bruits, worse on right than left Lungs: clear to auscultation bilaterally Heart: regular rate and rhythm, S1, S2 normal and systolic murmur: early systolic 3/6, crescendo at 2nd right intercostal space Abdomen: soft, non-tender; bowel sounds normal; no masses,  no organomegaly Extremities: extremities normal, atraumatic, no cyanosis or edema Pulses: 2+ and symmetric Skin: Skin color, texture, turgor normal. No rashes or lesions Neurologic: Mental status: Alert, oriented, thought content appropriate, Speech impairment Psych: Normal mood, affect  EKG: Sinus rhythm with PACs at 82  ASSESSMENT: 1. Chronic diastolic heart failure, EF 55-60% 2. Peripheral arterial disease with bilateral carotid artery disease 3. History of L fem-pop, right femoral PTA 4. History of mycotic thoracic aortic aneurysm s/p stent graft repair 5. Stroke 6. DM2 7. HTN 8. Dyslipidemia 9. History of NSTEMI  PLAN: 1.   [name] appears to be euvolemic on his current regimen of diuretics which his Lasix every other day. He understands the importance of salt restriction and good blood pressure control. He appears stable and unchanged from when he was seen by Dr. Haroldine Laws last week. His dry weight is stable. I would recommend continuing his current medications and I'll see him back in 6 months.  Pixie Casino, MD, [facility] Attending Cardiologist [facility]  HILTY,Kenneth C 09/27/2013, 5:00 PM

## 2013-09-27 NOTE — Telephone Encounter (Signed)
Pt called and said he need to tell Dr [facility] nurse that  he is taking 6.25 mg of Carveolol.

## 2013-09-27 NOTE — Patient Instructions (Signed)
Your physician wants you to follow-up in:  6 months. You will receive a reminder letter in the mail two months in advance. If you don't receive a letter, please call our office to schedule the follow-up appointment.   

## 2014-02-14 ENCOUNTER — Other Ambulatory Visit: Payer: Self-pay | Admitting: Vascular Surgery

## 2014-02-14 LAB — CREATININE, SERUM: Creat: 1.4 mg/dL — ABNORMAL HIGH (ref 0.50–1.35)

## 2014-02-14 LAB — BUN: BUN: 30 mg/dL — AB (ref 6–23)

## 2014-02-15 ENCOUNTER — Encounter: Payer: Self-pay | Admitting: Vascular Surgery

## 2014-02-16 ENCOUNTER — Encounter: Payer: Self-pay | Admitting: Vascular Surgery

## 2014-02-16 ENCOUNTER — Ambulatory Visit (INDEPENDENT_AMBULATORY_CARE_PROVIDER_SITE_OTHER): Payer: Medicare Other | Admitting: Vascular Surgery

## 2014-02-16 ENCOUNTER — Ambulatory Visit
Admission: RE | Admit: 2014-02-16 | Discharge: 2014-02-16 | Disposition: A | Discharge status: Home / Self Care | Payer: Medicare Other | Source: Ambulatory Visit | Attending: Vascular Surgery | Admitting: Vascular Surgery

## 2014-02-16 VITALS — BP 126/67 | HR 89 | Resp 14 | Ht 65.0 in | Wt 146.0 lb

## 2014-02-16 DIAGNOSIS — I771 Stricture of artery: Secondary | ICD-10-CM

## 2014-02-16 DIAGNOSIS — I739 Peripheral vascular disease, unspecified: Secondary | ICD-10-CM

## 2014-02-16 DIAGNOSIS — I6529 Occlusion and stenosis of unspecified carotid artery: Secondary | ICD-10-CM

## 2014-02-16 DIAGNOSIS — I6523 Occlusion and stenosis of bilateral carotid arteries: Secondary | ICD-10-CM

## 2014-02-16 MED ORDER — IOHEXOL 350 MG/ML SOLN
80.0000 mL | Freq: Once | INTRAVENOUS | Status: AC | PRN
  Administered 2014-02-16: 80 mL via INTRAVENOUS

## 2014-02-16 NOTE — Progress Notes (Addendum)
[facility] [place] Office Progress Note   [name], [name], MD  HPI: This is a 80 y.o. male who is here for 6 month follow up. He was last seen in the office on 08/18/13 where he was scheduled for a carotid arteriogram but canceled due to congestive heart failure. He denied any TIA or stroke symptoms at that time. He also had a previous duplex that suggested bilateral subclavian stenosis.  He was asked to return in 6 months with a CTA of the neck and chest.   Today, he denies any changes in his medical history since his last visit. He denies any amaurosis fugax, hemiplegia, expressive or receptive aphasia or facial drooping. He denies any issues with hand pain, weakness, or numbness.   On ROS, he has shortness of breath with exertion, swelling of his legs and intermittent numbness in his left thigh he attributes to back pain. He denies claudication and non-healing wounds. He denies any chest pain.   Past Medical History  Diagnosis Date  . Diabetes mellitus, type 2   . Hypertension   . GERD (gastroesophageal reflux disease)   . Osteoporosis, senile   . High cholesterol   . Seasonal allergies   . MRSA infection     s/p '02-no problems now  . Anemia   . Stroke   . Transfusion history     '02- back surgery  . PAD (peripheral artery disease)     s/p L Fem-Pop Bypass; R Iliac PTA  . Left carotid artery occlusion     moderate R carotid disease; L Common Carotid Bypassed during Arch Repair.  . H/O thoracic aortic aneurysm repair     Stent Graft (penetrating ulcer) - r/t staphylococcal infection  . Myocardial infarction Feb. 9, 2015    Mild Heart attack  . Flu Feb. 9, 2015    ?  Mild Heart Attack  . CHF (congestive heart failure) 05/2013  . History of tobacco use     50 pack year history, quit in 2002   Past Surgical History  Procedure Laterality Date  . Back surgery  2001  . Cleft lip repair    . Pr vein bypass graft,aorto-fem-pop  1999  . Joint replacement  Right 2003    right hip   . Colonoscopy with propofol N/A 08/10/2012    Procedure: COLONOSCOPY WITH PROPOFOL;  Surgeon: Christopher Fair, MD;  Location: [facility];  Service: Endoscopy;  Laterality: N/A;  . Esophagogastroduodenoscopy (egd) with propofol N/A 08/10/2012    Procedure: ESOPHAGOGASTRODUODENOSCOPY (EGD) WITH PROPOFOL;  Surgeon: Christopher Fair, MD;  Location: [facility];  Service: Endoscopy;  Laterality: N/A;  . Iliac artery stent Right     PTA -STENT  . 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 Hemiarthroplasty hip Right   . Thoracic aortic endovascular stent graft  2003    @ [facility]  - for Penetrating Ulcer/Aneurysm (cardiac cath done 01/08/2001 by Dr. Antionette Poles)  . Vascular surgery    . Transthoracic echocardiogram  10/2009    EF=>55%, LA mildly dilated, mild mitral annular calfication, mild MR, mild TR, RVSP 30-67mmHg, AV mildly sclerotic, trace pulm valve regurg    Allergies  Allergen Reactions  . Codeine Nausea And Vomiting    Current Outpatient Prescriptions  Medication Sig Dispense Refill  .  aspirin EC 81 MG tablet Take 81 mg by mouth every morning.     Marland Kitchen atorvastatin (LIPITOR) 40 MG tablet Take 20 mg by mouth every evening.     . carvedilol (COREG) 6.25 MG tablet Take 6.25 mg by mouth 2 (two) times daily with a meal.    . Cholecalciferol (VITAMIN D) 2000 UNITS tablet Take 2,000 Units by mouth daily.    Marland Kitchen diltiazem (CARTIA XT) 240 MG 24 hr capsule Take 240 mg by mouth daily.    Marland Kitchen doxazosin (CARDURA) 2 MG tablet Take 2 mg by mouth daily.     . Ferrous Gluconate (IRON) 246 (28 FE) MG TABS Take 2 tablets by mouth daily.    . fluticasone (FLONASE) 50 MCG/ACT nasal spray Place 2 sprays into the nose daily.    . furosemide (LASIX) 40 MG tablet Take 20 mg by mouth daily.     Marland Kitchen glimepiride (AMARYL) 2 MG tablet Take 2  mg by mouth daily before breakfast.    . HYDROcodone-acetaminophen (NORCO/VICODIN) 5-325 MG per tablet Take 2 tablets by mouth every 6 (six) hours as needed for pain.     . isosorbide dinitrate (ISORDIL) 10 MG tablet Take 1 tablet (10 mg total) by mouth 3 (three) times daily. 90 tablet 2  . loratadine (CLARITIN) 10 MG tablet Take 10 mg by mouth daily.    . Multiple Vitamin (MULTIVITAMIN) capsule Take 1 capsule by mouth daily.    . niacin (SLO-NIACIN) 500 MG tablet Take 500 mg by mouth daily.     . Omega-3 Fatty Acids (FISH OIL) 1200 MG CAPS Take 1 capsule by mouth every morning.    Marland Kitchen omeprazole (PRILOSEC) 20 MG capsule Take 20 mg by mouth daily.    . polyethylene glycol (MIRALAX / GLYCOLAX) packet Take 17 g by mouth daily.    . temazepam (RESTORIL) 30 MG capsule Take 30 mg by mouth at bedtime as needed for sleep. For sleep    . diphenhydrAMINE (BENADRYL) 25 MG tablet Take 25 mg by mouth at bedtime.     No current facility-administered medications for this visit.    Family History  Problem Relation Age of Onset  . Diabetes Mother   . Hypertension Mother     heart disese, died of MI at 89  . Hyperlipidemia Mother   . Diabetes Sister     also Alzheimers  . Heart disease Son     Heart Disease before age 45  . Heart disease Father     CHF  . Heart attack Father   . Arthritis Father   . Marfan syndrome Daughter   . AAA (abdominal aortic aneurysm) Son     repair in 8s    History   Social History  . Marital Status: Widowed    Spouse Name: N/A    Number of Children: 2  . Years of Education: N/A   Occupational History  . Not on file.   Social History Main Topics  . Smoking status: Former Smoker    Quit date: 09/24/2000  . Smokeless tobacco: Never Used  . Alcohol Use: No  . Drug Use: No  . Sexual Activity: Not Currently   Other Topics Concern  . Not on file   Social History Narrative     ROS: [x]  Positive   [ ]  Negative   [ ]  All sytems reviewed and are  negative  Cardiovascular: []  chest pain/pressure []  palpitations []  SOB lying flat []  DOE []  pain in legs while walking []   pain in feet when lying flat []  hx of DVT []  hx of phlebitis [x]  swelling in legs []  varicose veins  Pulmonary: []  productive cough []  asthma []  wheezing  Neurologic: []  weakness in []  arms []  legs [x]  numbness in []  arms []  legs [] difficulty speaking or slurred speech []  temporary loss of vision in one eye [x]  dizziness  Hematologic: []  bleeding problems []  problems with blood clotting easily  GI []  vomiting blood []  blood in stool  GU: []  burning with urination []  blood in urine  Psychiatric: []  hx of major depression  Integumentary: []  rashes []  ulcers  Constitutional: []  fever []  chills   PHYSICAL EXAMINATION:  Filed Vitals:   02/16/14 1252  BP: 126/67  Pulse: 89  Resp:    Body mass index is 24.3 kg/(m^2).  General:  WDWN elderly male in NAD Gait: Not observed HENT: WNL, normocephalic Eyes: Pupils equal Pulmonary: normal non-labored breathing , without Rales, rhonchi,  wheezing Cardiac: RRR, with 3/6 systolic murmur heard best in the left second interspace, bilateral carotid bruit, left more pronounced.  Skin: without rashes, without ulcers  Vascular Exam/Pulses: 2+ radial pulses bilaterally. Non palpable pedal pulses. Feet are warm and well perfused.  Extremities: without ischemic changes, no lower extremity swelling.  Musculoskeletal: no muscle wasting or atrophy  Neurologic: A&O X 3; Appropriate Affect ; SENSATION: normal; MOTOR FUNCTION:  moving all extremities equally. Speech is difficult to understand.    ASSESSMENT/PLAN: 80 y.o. male with chronically occluded left CCA. S/p innominate and left common carotid artery bypass in 2002,  left femoral popliteal bypass 1998, revision 1999, thoracic stent graft for penetrating aortic ulcer in 2003 (at [facility]).   CTA of the neck and chest performed today revealed  chronic occlusion of the left CCA with no significant stenosis of the right ICA. His subclavian arteries are patent bilaterally with chronic mild to moderate stenosis of the left subclavian. His CTA neck findings are consistent with findings since 2012.   He remains asymptomatic with no TIA/stroke symptoms. His congestive heart failure is well compensated and his following up with his cardiologist Dr. Debara Pickett regularly. He will follow up in one year with carotid duplex studies.    Virgina Jock, PA-C [facility] [phone]  Clinic MD:  Pt seen and examined in conjunction with Dr. Oneida Alar.   History and exam details as above. Patient remains asymptomatic. I reviewed his CT Angio of the neck and chest. Findings of the CT Angio the neck as above. CT Angio the chest shows the stent graft is in good position with no aneurysmal dilatation above or below this  Follow-up 1 year with carotid duplex scan. We will intermittently get CT angiogram of the chest to evaluate his stent graft.  Ruta Hinds, MD [facility] [place] Office: [phone] Pager: [phone]  Addendum: final read of chest CT shows lung nodule that needs follow up Ct in 3 months.  Will send a note to Dr Joylene Draft regarding this.  Ruta Hinds, MD [facility] [place] Office: [phone] Pager: [phone]

## 2014-02-16 NOTE — Addendum Note (Signed)
Addended by: Mena Goes on: 02/16/2014 04:41 PM   Modules accepted: Orders

## 2014-02-17 ENCOUNTER — Other Ambulatory Visit: Payer: Self-pay | Admitting: Internal Medicine

## 2014-02-17 DIAGNOSIS — R911 Solitary pulmonary nodule: Secondary | ICD-10-CM

## 2014-03-16 ENCOUNTER — Telehealth: Payer: Self-pay | Admitting: Internal Medicine

## 2014-03-17 NOTE — Telephone Encounter (Signed)
Close encounter 

## 2014-03-21 ENCOUNTER — Ambulatory Visit: Payer: Medicare Other | Admitting: Internal Medicine

## 2014-04-25 ENCOUNTER — Ambulatory Visit (INDEPENDENT_AMBULATORY_CARE_PROVIDER_SITE_OTHER): Payer: Medicare Other | Admitting: Internal Medicine

## 2014-04-25 ENCOUNTER — Encounter: Payer: Self-pay | Admitting: Internal Medicine

## 2014-04-25 VITALS — BP 140/56 | HR 66 | Ht 64.0 in | Wt 149.9 lb

## 2014-04-25 DIAGNOSIS — I739 Peripheral vascular disease, unspecified: Secondary | ICD-10-CM

## 2014-04-25 DIAGNOSIS — E7800 Pure hypercholesterolemia, unspecified: Secondary | ICD-10-CM

## 2014-04-25 DIAGNOSIS — Z8673 Personal history of transient ischemic attack (TIA), and cerebral infarction without residual deficits: Secondary | ICD-10-CM

## 2014-04-25 DIAGNOSIS — I5022 Chronic systolic (congestive) heart failure: Secondary | ICD-10-CM

## 2014-04-25 DIAGNOSIS — I10 Essential (primary) hypertension: Secondary | ICD-10-CM

## 2014-04-25 NOTE — Progress Notes (Signed)
OFFICE NOTE  Chief Complaint:  Routine follow-up  Primary Care Physician: [name], MD  HPI:  [name] is an 81 year old male that I had previously seen in 2011. 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 He underwent stent grafting and did well from that. He has as history of hypertension and aortic sclerosis. Since I saw him last, he has had a number of events including chronic diastolic HF (EF 16-60% in 3/15), presumed CAD, extensive PAD with left femoropopliteal bypass in 1999 preceded by right internal iliac stenting in 1997. 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 He said he also had stent grafting of a penetrating ulcer in the descending and thoracic aorta (done at [facility], 2003). He has diabetes on oral medications as well as hypertension and hyperlipidemia.   He was recently admitted 3/16/-06/16/13 for increased weight gain and worsening LE edema. ECHO showed EF 55-60% with mild diastolic dysfx. Discharge weight 146 lbs. Hospitalized in Feb 2015 with the flu and NSTEMI EF at that time 45%. He was seen last week by Dr. Haroldine Laws in the heart failure clinic for followup. It was noted that he was euvolemic and was maintaining his dry weight without worsening shortness of breath. He was subsequently referred back to me for followup.  He has no complaints today.  I saw Mr. Woodhams back in the office today. He denies any worsening shortness of breath or chest discomfort. He continues to be as active as he can be but walks with a walker. He's had no worsening swelling or weight gain since I last saw him in the office. His weight has generally stayed within 1 or 2 pounds of his dry weight. There has been no further TIAs or strokes that I'm aware of. He was recently seen by Dr. Oneida Alar who had performed a CTA which shows  stable aortic graft architecture.  PMHx:  Past Medical History  Diagnosis Date  . Diabetes mellitus, type 2   . Hypertension   . GERD (gastroesophageal reflux disease)   . Osteoporosis, senile   . High cholesterol   . Seasonal allergies   . MRSA infection     s/p '02-no problems now  . Anemia   . Stroke   . Transfusion history     '02- back surgery  . PAD (peripheral artery disease)     s/p L Fem-Pop Bypass; R Iliac PTA  . Left carotid artery occlusion     moderate R carotid disease; L Common Carotid Bypassed during Arch Repair.  . H/O thoracic aortic aneurysm repair     Stent Graft (penetrating ulcer) - r/t staphylococcal infection  . Myocardial infarction Feb. 9, 2015    Mild Heart attack  . Flu Feb. 9, 2015    ?  Mild Heart Attack  . CHF (congestive heart failure) 05/2013  . History of tobacco use     50 pack year history, quit in 2002    Past Surgical History  Procedure Laterality Date  . Back surgery  2001  . Cleft lip repair    . Pr vein bypass graft,aorto-fem-pop  1999  . Joint replacement Right 2003    right hip   . Colonoscopy with propofol N/A 08/10/2012    Procedure: COLONOSCOPY WITH PROPOFOL;  Surgeon: Garlan Fair, MD;  Location: [facility];  Service: Endoscopy;  Laterality: N/A;  . Esophagogastroduodenoscopy (egd) with propofol N/A 08/10/2012    Procedure: ESOPHAGOGASTRODUODENOSCOPY (EGD) WITH PROPOFOL;  Surgeon: Garlan Fair, MD;  Location: [facility];  Service: Endoscopy;  Laterality: N/A;  . Iliac artery stent Right     PTA -STENT  . 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 Hemiarthroplasty hip Right   . Thoracic aortic endovascular stent graft  2003    @ [facility]  - for Penetrating Ulcer/Aneurysm (cardiac cath done 01/08/2001 by Dr. Antionette Poles)  . Vascular surgery    . Transthoracic  echocardiogram  10/2009    EF=>55%, LA mildly dilated, mild mitral annular calfication, mild MR, mild TR, RVSP 30-52mmHg, AV mildly sclerotic, trace pulm valve regurg    FAMHx:  Family History  Problem Relation Age of Onset  . Diabetes Mother   . Hypertension Mother     heart disese, died of MI at 10  . Hyperlipidemia Mother   . Diabetes Sister     also Alzheimers  . Heart disease Son     Heart Disease before age 80  . Heart disease Father     CHF  . Heart attack Father   . Arthritis Father   . Marfan syndrome Daughter   . AAA (abdominal aortic aneurysm) Son     repair in 34s    SOCHx:   reports that he quit smoking about 13 years ago. He has never used smokeless tobacco. He reports that he does not drink alcohol or use illicit drugs.  ALLERGIES:  Allergies  Allergen Reactions  . Codeine Nausea And Vomiting    ROS: A comprehensive review of systems was negative.  HOME MEDS: Current Outpatient Prescriptions  Medication Sig Dispense Refill  . aspirin EC 81 MG tablet Take 81 mg by mouth every morning.     Marland Kitchen atorvastatin (LIPITOR) 40 MG tablet Take 20 mg by mouth every evening.     . carvedilol (COREG) 6.25 MG tablet Take 6.25 mg by mouth 2 (two) times daily with a meal.    . Cholecalciferol (VITAMIN D) 2000 UNITS tablet Take 2,000 Units by mouth daily.    Marland Kitchen diltiazem (CARTIA XT) 240 MG 24 hr capsule Take 240 mg by mouth daily.    . diphenhydrAMINE (BENADRYL) 25 MG tablet Take 25 mg by mouth at bedtime.    Marland Kitchen doxazosin (CARDURA) 2 MG tablet Take 2 mg by mouth daily.     . Ferrous Gluconate (IRON) 246 (28 FE) MG TABS Take 2 tablets by mouth daily.    . fluticasone (FLONASE) 50 MCG/ACT nasal spray Place 2 sprays into the nose daily.    . furosemide (LASIX) 40 MG tablet Take 20 mg by mouth every other day.     Marland Kitchen glimepiride (AMARYL) 2 MG tablet Take 2 mg by mouth daily as needed.     Marland Kitchen HYDROcodone-acetaminophen (NORCO/VICODIN) 5-325 MG per tablet Take 2 tablets by mouth every  6 (six) hours as needed for pain.     . isosorbide dinitrate (ISORDIL) 10 MG tablet Take 1 tablet (10 mg total) by mouth 3 (three) times daily. 90 tablet 2  . loratadine (CLARITIN) 10 MG tablet Take 10 mg by mouth daily.    . Multiple Vitamin (MULTIVITAMIN) capsule Take 1 capsule by mouth daily.    . niacin (SLO-NIACIN) 500  MG tablet Take 500 mg by mouth daily.     . Omega-3 Fatty Acids (FISH OIL) 1200 MG CAPS Take 4 capsules by mouth every morning.     Marland Kitchen omeprazole (PRILOSEC) 20 MG capsule Take 20 mg by mouth daily.    . polyethylene glycol (MIRALAX / GLYCOLAX) packet Take 17 g by mouth daily.    . temazepam (RESTORIL) 30 MG capsule Take 30 mg by mouth at bedtime as needed for sleep. For sleep     No current facility-administered medications for this visit.    LABS/IMAGING: No results found for this or any previous visit (from the past 48 hour(s)). No results found.  VITALS: BP 140/56 mmHg  Pulse 66  Ht 5\' 4"  (1.626 m)  Wt 149 lb 14.4 oz (67.994 kg)  BMI 25.72 kg/m2  EXAM: General appearance: alert and no distress Neck: no JVD and bilateral carotid bruits, worse on right than left Lungs: clear to auscultation bilaterally Heart: regular rate and rhythm, S1, S2 normal and systolic murmur: early systolic 3/6, crescendo at 2nd right intercostal space Abdomen: soft, non-tender; bowel sounds normal; no masses,  no organomegaly Extremities: extremities normal, atraumatic, no cyanosis or edema Pulses: 2+ and symmetric Skin: Skin color, texture, turgor normal. No rashes or lesions Neurologic: Mental status: Alert, oriented, thought content appropriate, Speech impairment Psych: Normal mood, affect  EKG: Sinus rhythm with PACs at 82  ASSESSMENT: 1. Chronic diastolic heart failure, EF 55-60% 2. Peripheral arterial disease with bilateral carotid artery disease 3. History of L fem-pop, right femoral PTA 4. History of mycotic thoracic aortic aneurysm s/p stent graft  repair 5. Stroke 6. DM2 7. HTN 8. Dyslipidemia 9. History of NSTEMI  PLAN: 1.   Mr. Dorris appears close to his baseline weight. He is euvolemic on exam and continues to do well with a very other day dosing of Lasix. He's being followed for his thoracic aneurysm repair by Dr. Oneida Alar who recently felt that it was very stable on CT. He also has a history of left femoropopliteal and right femoral PTA which is stable. He's had no further strokes. Blood pressure is high normal today. He denies any chest pain and his EKG does show infrequent PVCs. He is unaware of these. There is no suggestion of ischemia and no further ischemic workup is recommended this time.  Plan to see him back in 6 months. He is also followed by the advanced heart failure clinic.  Pixie Casino, MD, [facility] Attending Cardiologist [facility]  Shunte Senseney C 04/25/2014, 3:22 PM

## 2014-04-25 NOTE — Patient Instructions (Signed)
Your physician wants you to follow-up in:  6 months. You will receive a reminder letter in the mail two months in advance. If you don't receive a letter, please call our office to schedule the follow-up appointment.   

## 2014-04-26 ENCOUNTER — Telehealth: Payer: Self-pay | Admitting: Internal Medicine

## 2014-04-26 NOTE — Telephone Encounter (Signed)
Spoke with patient. He was wondering was the appointment for March @ [place] was for. Informed him that Dr. Oneida Alar had ordered repeat CT Chest after appointment in Nov 2015. He voiced understanding of this.

## 2014-04-26 NOTE — Telephone Encounter (Signed)
Was in o yesterday to see Dr. Debara Pickett and saw something  On the print out that he is not understanding please call .Marland Kitchen   Thanks

## 2014-05-02 ENCOUNTER — Encounter: Payer: Self-pay | Admitting: Internal Medicine

## 2014-06-14 ENCOUNTER — Inpatient Hospital Stay
Admission: RE | Admit: 2014-06-14 | Discharge: 2014-06-14 | Disposition: A | Discharge status: Home / Self Care | Payer: Medicare Other | Source: Ambulatory Visit | Attending: Internal Medicine | Admitting: Internal Medicine

## 2014-06-14 ENCOUNTER — Ambulatory Visit
Admission: RE | Admit: 2014-06-14 | Discharge: 2014-06-14 | Disposition: A | Discharge status: Home / Self Care | Payer: Medicare Other | Source: Ambulatory Visit | Attending: Internal Medicine | Admitting: Internal Medicine

## 2014-06-14 DIAGNOSIS — R911 Solitary pulmonary nodule: Secondary | ICD-10-CM

## 2014-10-03 NOTE — Patient Outreach (Signed)
[facility]) Care Management  10/03/2014  EUEL CASTILE 1930/09/21 [id number]   Referral from [place], Sherrin Daisy, RN assigned to outreach.  Ronnell Freshwater. [place], [place] Management [place] Assistant Phone: [phone] Fax: [phone]

## 2014-10-23 ENCOUNTER — Ambulatory Visit (INDEPENDENT_AMBULATORY_CARE_PROVIDER_SITE_OTHER): Payer: Medicare Other | Admitting: Internal Medicine

## 2014-10-23 ENCOUNTER — Encounter: Payer: Self-pay | Admitting: Internal Medicine

## 2014-10-23 VITALS — BP 96/64 | HR 67 | Ht 65.0 in | Wt 141.6 lb

## 2014-10-23 DIAGNOSIS — Z8673 Personal history of transient ischemic attack (TIA), and cerebral infarction without residual deficits: Secondary | ICD-10-CM | POA: Diagnosis not present

## 2014-10-23 DIAGNOSIS — I739 Peripheral vascular disease, unspecified: Secondary | ICD-10-CM | POA: Diagnosis not present

## 2014-10-23 DIAGNOSIS — E7800 Pure hypercholesterolemia, unspecified: Secondary | ICD-10-CM

## 2014-10-23 DIAGNOSIS — I509 Heart failure, unspecified: Secondary | ICD-10-CM | POA: Diagnosis not present

## 2014-10-23 DIAGNOSIS — I10 Essential (primary) hypertension: Secondary | ICD-10-CM | POA: Diagnosis not present

## 2014-10-23 DIAGNOSIS — I214 Non-ST elevation (NSTEMI) myocardial infarction: Secondary | ICD-10-CM | POA: Diagnosis not present

## 2014-10-23 NOTE — Progress Notes (Signed)
OFFICE NOTE  Chief Complaint:  Routine follow-up  Primary Care Physician: [name], MD  HPI:  [name] is an 81 year old male that I had previously seen in 2011. 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 He underwent stent grafting and did well from that. He has as history of hypertension and aortic sclerosis. Since I saw him last, he has had a number of events including chronic diastolic HF (EF 97-41% in 3/15), presumed CAD, extensive PAD with left femoropopliteal bypass in 1999 preceded by right internal iliac stenting in 1997. 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 He has diabetes on oral medications as well as hypertension and hyperlipidemia.   He was recently admitted 3/16/-06/16/13 for increased weight gain and worsening LE edema. ECHO showed EF 55-60% with mild diastolic dysfx. Discharge weight 146 lbs. Hospitalized in Feb 2015 with the flu and NSTEMI EF at that time 45%. He was seen last week by Dr. Haroldine Laws in the heart failure clinic for followup. It was noted that he was euvolemic and was maintaining his dry weight without worsening shortness of breath. He was subsequently referred back to me for followup.  He has no complaints today.  I saw [name] back in the office today. He denies any worsening shortness of breath or chest discomfort. He continues to be as active as he can be but walks with a walker. He's had no worsening swelling or weight gain since I last saw him in the office. His weight has generally stayed within 1 or 2 pounds of his dry weight. There has been no further TIAs or strokes that I'm aware of. He was recently seen by Dr. Oneida Alar who had performed a CTA which shows  stable aortic graft architecture.  Mr. Corkins returns today for follow-up. Of note his blood pressure is slightly low today with systolic below 638. He says it usually ranges between 453 and 646 systolic. He denies any chest pain or shortness of breath. He has a stable aortic murmur and echo last year showed a roughly well preserved EF without any significant aortic valvular disease although it was of decreased quality.  PMHx:  Past Medical History  Diagnosis Date  . Diabetes mellitus, type 2   . Hypertension   . GERD (gastroesophageal reflux disease)   . Osteoporosis, senile   . High cholesterol   . Seasonal allergies   . MRSA infection     s/p '02-no problems now  . Anemia   . Stroke   . Transfusion history     '02- back surgery  . PAD (peripheral artery disease)     s/p L Fem-Pop Bypass; R Iliac PTA  . Left carotid artery occlusion     moderate R carotid disease; L Common Carotid Bypassed during Arch Repair.  . H/O thoracic aortic aneurysm repair     Stent Graft (penetrating ulcer) - r/t staphylococcal infection  . Myocardial infarction Feb. 9, 2015    Mild Heart attack  . Flu Feb. 9, 2015    ?  Mild Heart Attack  . CHF (congestive heart failure) 05/2013  . History of tobacco use     50 pack year history, quit in 2002  Past Surgical History  Procedure Laterality Date  . Back surgery  2001  . Cleft lip repair    . Pr vein bypass graft,aorto-fem-pop  1999  . Joint replacement Right 2003    right hip   . Colonoscopy with propofol N/A 08/10/2012    Procedure: COLONOSCOPY WITH PROPOFOL;  Surgeon: Garlan Fair, MD;  Location: [facility];  Service: Endoscopy;  Laterality: N/A;  . Esophagogastroduodenoscopy (egd) with propofol N/A 08/10/2012    Procedure: ESOPHAGOGASTRODUODENOSCOPY (EGD) WITH PROPOFOL;  Surgeon: Garlan Fair, MD;  Location: [facility];  Service: Endoscopy;  Laterality: N/A;  . Iliac artery stent Right     PTA -STENT  . Aortic arch repair - with r  innominate artery resection; bypass to distal r innominate & l common carotid  07/2000    For Mycotic Aneurysm of R Innominate A with occluded Innominate & L Common Carotid  . Hemiarthroplasty hip Right   . Thoracic aortic endovascular stent graft  2003    @ [facility]  - for Penetrating Ulcer/Aneurysm (cardiac cath done 01/08/2001 by Dr. Antionette Poles)  . Vascular surgery    . Transthoracic echocardiogram  10/2009    EF=>55%, LA mildly dilated, mild mitral annular calfication, mild MR, mild TR, RVSP 30-76mmHg, AV mildly sclerotic, trace pulm valve regurg    FAMHx:  Family History  Problem Relation Age of Onset  . Diabetes Mother   . Hypertension Mother     heart disese, died of MI at 19  . Hyperlipidemia Mother   . Diabetes Sister     also Alzheimers  . Heart disease Son     Heart Disease before age 28  . Heart disease Father     CHF  . Heart attack Father   . Arthritis Father   . Marfan syndrome Daughter   . AAA (abdominal aortic aneurysm) Son     repair in 75s    SOCHx:   reports that he quit smoking about 14 years ago. He has never used smokeless tobacco. He reports that he does not drink alcohol or use illicit drugs.  ALLERGIES:  Allergies  Allergen Reactions  . Codeine Nausea And Vomiting    ROS: A comprehensive review of systems was negative.  HOME MEDS: Current Outpatient Prescriptions  Medication Sig Dispense Refill  . aspirin EC 81 MG tablet Take 81 mg by mouth every morning.     Marland Kitchen atorvastatin (LIPITOR) 40 MG tablet Take 20 mg by mouth every evening.     . carvedilol (COREG) 6.25 MG tablet Take 6.25 mg by mouth 2 (two) times daily with a meal.    . Cholecalciferol (VITAMIN D) 2000 UNITS tablet Take 2,000 Units by mouth daily.    Marland Kitchen diltiazem (CARTIA XT) 240 MG 24 hr capsule Take 240 mg by mouth daily.    . diphenhydrAMINE (BENADRYL) 25 MG tablet Take 25 mg by mouth at bedtime.    Marland Kitchen doxazosin (CARDURA) 2 MG tablet Take 2 mg by mouth daily.     . Ferrous Gluconate (IRON)  246 (28 FE) MG TABS Take 2 tablets by mouth daily.    . fluticasone (FLONASE) 50 MCG/ACT nasal spray Place 2 sprays into the nose daily.    . furosemide (LASIX) 40 MG tablet Take 20 mg by mouth every other day.     Marland Kitchen glimepiride (AMARYL) 2 MG tablet Take 2 mg by mouth daily as needed.     Marland Kitchen HYDROcodone-acetaminophen (NORCO/VICODIN) 5-325 MG per tablet Take 2 tablets by mouth  every 6 (six) hours as needed for pain.     . isosorbide dinitrate (ISORDIL) 10 MG tablet Take 1 tablet (10 mg total) by mouth 3 (three) times daily. 90 tablet 2  . loratadine (CLARITIN) 10 MG tablet Take 10 mg by mouth daily.    . Multiple Vitamin (MULTIVITAMIN) capsule Take 1 capsule by mouth daily.    . niacin (SLO-NIACIN) 500 MG tablet Take 500 mg by mouth daily.     . Omega-3 Fatty Acids (FISH OIL) 1200 MG CAPS Take 4 capsules by mouth every morning.     Marland Kitchen omeprazole (PRILOSEC) 20 MG capsule Take 20 mg by mouth daily.    . polyethylene glycol (MIRALAX / GLYCOLAX) packet Take 17 g by mouth daily.    . temazepam (RESTORIL) 30 MG capsule Take 30 mg by mouth at bedtime as needed for sleep. For sleep     No current facility-administered medications for this visit.    LABS/IMAGING: No results found for this or any previous visit (from the past 48 hour(s)). No results found.  VITALS: BP 96/64 mmHg  Pulse 67  Ht 5\' 5"  (1.651 m)  Wt 141 lb 9.6 oz (64.229 kg)  BMI 23.56 kg/m2  EXAM: General appearance: alert and no distress Neck: no JVD and bilateral carotid bruits, worse on right than left Lungs: clear to auscultation bilaterally Heart: regular rate and rhythm, S1, S2 normal and systolic murmur: early systolic 3/6, crescendo at 2nd right intercostal space Abdomen: soft, non-tender; bowel sounds normal; no masses,  no organomegaly Extremities: extremities normal, atraumatic, no cyanosis or edema Pulses: 2+ and symmetric Skin: Skin color, texture, turgor normal. No rashes or lesions Neurologic: Mental status:  Alert, oriented, thought content appropriate, Speech impairment Psych: Normal mood, affect  EKG: Sinus rhythm at 67, nonspecific ST changes  ASSESSMENT: 1. Chronic diastolic heart failure, EF 55-60% 2. Peripheral arterial disease with bilateral carotid artery disease 3. History of L fem-pop, right femoral PTA 4. History of mycotic thoracic aortic aneurysm s/p stent graft repair 5. Stroke 6. DM2 7. HTN 8. Dyslipidemia 9. History of NSTEMI 10. Systolic murmur of aortic sclerosis  PLAN: 1.   Mr. Cafaro has actually lost some weight recently and this may explain why his blood pressure is low normal today. He says it normally runs higher than it is today and I've encouraged him to continue to watch it at home as we may need to adjust medicines if he loses further weight. He reports the weight loss is due to healthy dietary changes. He is systolic murmur is unchanged. There is no worsening edema. He reports his thoracic aneurysm repair is stable by CT. This is followed by Dr. Oneida Alar. Plan to see him back in 6 months or sooner as necessary.  Pixie Casino, MD, [facility] Attending Cardiologist [place] 10/23/2014, 1:51 PM

## 2014-10-23 NOTE — Patient Instructions (Signed)
Your physician wants you to follow-up in: 6 months with Dr. Hilty. You will receive a reminder letter in the mail two months in advance. If you don't receive a letter, please call our office to schedule the follow-up appointment.    

## 2014-10-31 ENCOUNTER — Other Ambulatory Visit: Payer: Self-pay | Admitting: *Deleted

## 2014-10-31 NOTE — Patient Outreach (Signed)
[place] [facility]) Care Management  10/31/2014  [name] October 22, 1930 [id number]  High Risk referral:  Telephone call to patient; left message on voice mail requesting return call.   Plan: will follow up.  Sherrin Daisy, RN BSN [place] Management Coordinator [facility] Care Management  [phone]

## 2014-11-02 ENCOUNTER — Other Ambulatory Visit: Payer: Medicare Other | Admitting: *Deleted

## 2014-11-02 NOTE — Patient Outreach (Signed)
[place] [facility]) Care Management  11/02/2014  [name] 02/07/1931 [id number]   Telephone call to patient; left voice message requesting call back.  Plan: will follow up. Sherrin Daisy, RN BSN [place] Management Coordinator [facility] Care Management  [phone]

## 2014-11-03 ENCOUNTER — Other Ambulatory Visit: Payer: Self-pay | Admitting: *Deleted

## 2014-11-03 ENCOUNTER — Encounter: Payer: Self-pay | Admitting: *Deleted

## 2014-11-03 NOTE — Patient Outreach (Signed)
[place] [facility]) Care Management  11/03/2014  [name] June 12, 1930 [id number]  High Risk referral:  Received incoming call from patient who is responding to outreach call.  Received HIPPA verification from patient. Patient advised of reason for call and of [facility] care management services.  Patient states no major health concerns at this time. States currently seeing primary care provider every 4-6 months. Seeing cardiologist for heart failure every 6 months. States no admissions since 2015. States he weighs self daily and knows yellow and red zone symptoms and when to call MD office and when to call 911.  Patient states he will use Medic Alert if needed. States his son is aware of when to call 911 also.  Patient states he uses mail order pharmacy and local pharmacy for medications. States no problems with co pays or obtaining prescriptions. Patient reports that he knows why he is taking each medications and is adherent to taking as prescribed by MD.  Patient does not feel the need of [facility] care management services at this time for case or disease management. Patient was advised of contact numbers for [facility] care management services and 24 hour Nurse Advice Line to use if needed.  Plan: will close case and send MD closure letter.  Sherrin Daisy, RN BSN [place] Management Coordinator [facility] Care Management  [phone]

## 2014-11-10 NOTE — Patient Outreach (Signed)
[place] [facility]) Care Management  11/03/2014   CID AGENA Mar 22, 1931 [id number]   Notification from Sherrin Daisy, RN to close case as patient assessed and not further interventions needed from [place] Management.  Thanks, Ronnell Freshwater. [place], [place] Assistant Phone: [phone] Fax: [phone]

## 2015-02-27 ENCOUNTER — Encounter: Payer: Self-pay | Admitting: Vascular Surgery

## 2015-03-01 ENCOUNTER — Encounter: Payer: Self-pay | Admitting: Vascular Surgery

## 2015-03-01 ENCOUNTER — Ambulatory Visit (INDEPENDENT_AMBULATORY_CARE_PROVIDER_SITE_OTHER): Payer: Medicare Other | Admitting: Vascular Surgery

## 2015-03-01 ENCOUNTER — Ambulatory Visit (HOSPITAL_COMMUNITY)
Admission: RE | Admit: 2015-03-01 | Discharge: 2015-03-01 | Disposition: A | Discharge status: Home / Self Care | Payer: Medicare Other | Source: Ambulatory Visit | Attending: Vascular Surgery | Admitting: Vascular Surgery

## 2015-03-01 ENCOUNTER — Other Ambulatory Visit: Payer: Self-pay | Admitting: Vascular Surgery

## 2015-03-01 VITALS — BP 155/67 | HR 60 | Ht 65.0 in | Wt 148.7 lb

## 2015-03-01 DIAGNOSIS — I6523 Occlusion and stenosis of bilateral carotid arteries: Secondary | ICD-10-CM | POA: Diagnosis not present

## 2015-03-01 DIAGNOSIS — E7800 Pure hypercholesterolemia, unspecified: Secondary | ICD-10-CM | POA: Insufficient documentation

## 2015-03-01 DIAGNOSIS — E119 Type 2 diabetes mellitus without complications: Secondary | ICD-10-CM | POA: Insufficient documentation

## 2015-03-01 DIAGNOSIS — Z48812 Encounter for surgical aftercare following surgery on the circulatory system: Secondary | ICD-10-CM

## 2015-03-01 DIAGNOSIS — Z87891 Personal history of nicotine dependence: Secondary | ICD-10-CM | POA: Insufficient documentation

## 2015-03-01 DIAGNOSIS — I739 Peripheral vascular disease, unspecified: Secondary | ICD-10-CM

## 2015-03-01 NOTE — Progress Notes (Signed)
[facility] Office Progress Note   [name], [name], MD  HPI: This is a 81 y.o. male who is here for yearly follow up. He was last seen in the office on November 2015. Previously he has undergone resection of the innominate artery aneurysm in 2002 with innominate and left common carotid artery bypass and has a chronic left common carotid artery occlusion. He also previously had a left femoral popliteal bypass in 1998 with revision in 1999 and a right external iliac artery angioplasty in 1997. Today, he denies any changes in his medical history since his last visit. He denies any amaurosis fugax, hemiplegia, expressive or receptive aphasia or facial drooping. He denies any issues with hand pain, weakness, or numbness. He does have some difficulty walking around due to chronic back pain.  On ROS, he has shortness of breath with exertion, swelling of his legs and intermittent numbness in his left thigh he attributes to back pain. He denies claudication and non-healing wounds. He denies any chest pain.     Past Medical History   Diagnosis  Date   .  Diabetes mellitus, type 2     .  Hypertension     .  GERD (gastroesophageal reflux disease)     .  Osteoporosis, senile     .  High cholesterol     .  Seasonal allergies     .  MRSA infection         s/p '02-no problems now   .  Anemia     .  Stroke     .  Transfusion history         '02- back surgery   .  PAD (peripheral artery disease)         s/p L Fem-Pop Bypass; R Iliac PTA   .  Left carotid artery occlusion         moderate R carotid disease; L Common Carotid Bypassed during Arch Repair.   .  H/O thoracic aortic aneurysm repair         Stent Graft (penetrating ulcer) - r/t staphylococcal infection   .  Myocardial infarction  Feb. 9, 2015       Mild Heart attack   .  Flu  Feb. 9, 2015       ?  Mild Heart Attack   .  CHF (congestive heart failure)  05/2013   .  History of tobacco use         50 pack year  history, quit in 2002    Past Surgical History   Procedure  Laterality  Date   .  Back surgery    2001   .  Cleft lip repair       .  Pr vein bypass graft,aorto-fem-pop    1999   .  Joint replacement  Right  2003       right hip    .  Colonoscopy with propofol  N/A  08/10/2012       Procedure: COLONOSCOPY WITH PROPOFOL;  Surgeon: Garlan Fair, MD;  Location: [facility];  Service: Endoscopy;  Laterality: N/A;   .  Esophagogastroduodenoscopy (egd) with propofol  N/A  08/10/2012       Procedure: ESOPHAGOGASTRODUODENOSCOPY (EGD) WITH PROPOFOL;  Surgeon: Garlan Fair, MD;  Location: [facility];  Service: Endoscopy;  Laterality: N/A;   .  Iliac artery stent  Right         PTA -STENT   .  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  Hemiarthroplasty hip  Right     .  Thoracic aortic endovascular stent graft    2003       @ [facility]  - for Penetrating Ulcer/Aneurysm (cardiac cath done 01/08/2001 by Dr. Antionette Poles)   .  Vascular surgery       .  Transthoracic echocardiogram    10/2009       EF=>55%, LA mildly dilated, mild mitral annular calfication, mild MR, mild TR, RVSP 30-55mmHg, AV mildly sclerotic, trace pulm valve regurg       Allergies   Allergen  Reactions   .  Codeine  Nausea And Vomiting       Current Outpatient Prescriptions   Medication  Sig  Dispense  Refill   .  aspirin EC 81 MG tablet  Take 81 mg by mouth every morning.        Marland Kitchen  atorvastatin (LIPITOR) 40 MG tablet  Take 20 mg by mouth every evening.        .  carvedilol (COREG) 6.25 MG tablet  Take 6.25 mg by mouth 2 (two) times daily with a meal.       .  Cholecalciferol (VITAMIN D) 2000 UNITS tablet  Take 2,000 Units by mouth daily.       Marland Kitchen  diltiazem (CARTIA XT) 240 MG 24 hr capsule  Take 240 mg by mouth daily.       Marland Kitchen  doxazosin (CARDURA) 2 MG tablet   Take 2 mg by mouth daily.        .  Ferrous Gluconate (IRON) 246 (28 FE) MG TABS  Take 2 tablets by mouth daily.       .  fluticasone (FLONASE) 50 MCG/ACT nasal spray  Place 2 sprays into the nose daily.       .  furosemide (LASIX) 40 MG tablet  Take 20 mg by mouth daily.        Marland Kitchen  glimepiride (AMARYL) 2 MG tablet  Take 2 mg by mouth daily before breakfast.       .  HYDROcodone-acetaminophen (NORCO/VICODIN) 5-325 MG per tablet  Take 2 tablets by mouth every 6 (six) hours as needed for pain.        .  isosorbide dinitrate (ISORDIL) 10 MG tablet  Take 1 tablet (10 mg total) by mouth 3 (three) times daily.  90 tablet  2   .  loratadine (CLARITIN) 10 MG tablet  Take 10 mg by mouth daily.       .  Multiple Vitamin (MULTIVITAMIN) capsule  Take 1 capsule by mouth daily.       .  niacin (SLO-NIACIN) 500 MG tablet  Take 500 mg by mouth daily.        .  Omega-3 Fatty Acids (FISH OIL) 1200 MG CAPS  Take 1 capsule by mouth every morning.       Marland Kitchen  omeprazole (PRILOSEC) 20 MG capsule  Take 20 mg by mouth daily.       .  polyethylene glycol (MIRALAX / GLYCOLAX) packet  Take 17 g by mouth daily.       .  temazepam (RESTORIL) 30 MG capsule  Take 30 mg by mouth at bedtime as needed for sleep. For  sleep       .  diphenhydrAMINE (BENADRYL) 25 MG tablet  Take 25 mg by mouth at bedtime.          No current facility-administered medications for this visit.       Family History   Problem  Relation  Age of Onset   .  Diabetes  Mother     .  Hypertension  Mother         heart disese, died of MI at 47   .  Hyperlipidemia  Mother     .  Diabetes  Sister         also Alzheimers   .  Heart disease  Son         Heart Disease before age 53   .  Heart disease  Father         CHF   .  Heart attack  Father     .  Arthritis  Father     .  Marfan syndrome  Daughter     .  AAA (abdominal aortic aneurysm)  Son         repair in 52s       History      Social History   .  Marital Status:  Widowed       Spouse Name:   N/A       Number of Children:  2   .  Years of Education:  N/A      Occupational History   .  Not on file.      Social History Main Topics   .  Smoking status:  Former Smoker       Quit date:  09/24/2000   .  Smokeless tobacco:  Never Used   .  Alcohol Use:  No   .  Drug Use:  No   .  Sexual Activity:  Not Currently      Other Topics  Concern   .  Not on file      Social History Narrative      ROS: [x]  Positive   [ ]  Negative   [ ]  All sytems reviewed and are negative  Cardiovascular: []  chest pain/pressure []  palpitations []  SOB lying flat []  DOE []  pain in legs while walking []  pain in feet when lying flat []  hx of DVT []  hx of phlebitis [x]  swelling in legs []  varicose veins  Pulmonary: []  productive cough []  asthma []  wheezing  Neurologic: []  weakness in []  arms []  legs [x]  numbness in []  arms []  legs [] difficulty speaking or slurred speech []  temporary loss of vision in one eye [x]  dizziness  Hematologic: []  bleeding problems []  problems with blood clotting easily  GI []  vomiting blood []  blood in stool  GU: []  burning with urination []  blood in urine  Psychiatric: []  hx of major depression  Integumentary: []  rashes []  ulcers  Constitutional: []  fever []  chills   PHYSICAL EXAMINATION:    Filed Vitals:   03/01/15 1345 03/01/15 1346  BP: 134/68 155/67  Pulse: 60   Height: 5\' 5"  (1.651 m)   Weight: 148 lb 11.2 oz (67.45 kg)   SpO2: 99%     Pulmonary: Clear to auscultation bilaterally Cardiac: RRR, with 3/6 systolic murmur heard best in the left second interspace, bilateral carotid bruit, left more pronounced.   Skin: without rashes, without ulcers  Vascular Exam/Pulses: 2+ radial pulses bilaterally. Non palpable pedal pulses. Feet are warm and well perfused.  Extremities: without ischemic changes, no lower extremity swelling.   Musculoskeletal: no muscle wasting or atrophy       Neurologic: A&O X 3; Appropriate Affect ;  SENSATION: normal; MOTOR FUNCTION:  moving all extremities equally. Speech is difficult to understand.   Data: Patient bilateral ABIs performed today which were 1.03 on the left with triphasic waveforms 0.82 on the right with monophasic waveforms. He also had a carotid duplex scan which showed 40-60% right internal carotid artery stenosis he did have a 20 mm gradient with the left arm pressure being higher than the right foot normal brachial artery waveforms. He is asymptomatic from this.   ASSESSMENT/PLAN: 81 y.o. male with chronically occluded left CCA. S/p innominate and left common carotid artery bypass in 2002,  left femoral popliteal bypass 1998, revision 1999, thoracic stent graft for penetrating aortic ulcer in 2003 (at [facility]).   CTA of the neck and chest performed today revealed chronic occlusion of the left CCA with no significant stenosis of the right ICA. His subclavian arteries are patent bilaterally with chronic mild to moderate stenosis of the left subclavian. His CTA neck findings are consistent with findings since 2012.   He remains asymptomatic with no TIA/stroke symptoms. His congestive heart failure is well compensated and his following up with his cardiologist Dr. Debara Pickett regularly. He will follow up in one year with carotid duplex studies and ABIs  We will intermittently get CT angiogram of the chest to evaluate his stent graft but he has had a scan recently saw no need for this within the next year   Ruta Hinds, MD [facility] [place]: [phone] Pager: [phone]

## 2015-03-01 NOTE — Addendum Note (Signed)
Addended by: Dorthula Rue L on: 03/01/2015 04:58 PM   Modules accepted: Orders

## 2015-04-26 ENCOUNTER — Ambulatory Visit (INDEPENDENT_AMBULATORY_CARE_PROVIDER_SITE_OTHER): Payer: Medicare Other | Admitting: Internal Medicine

## 2015-04-26 ENCOUNTER — Encounter: Payer: Self-pay | Admitting: Internal Medicine

## 2015-04-26 VITALS — BP 122/60 | HR 77 | Ht 64.0 in | Wt 147.4 lb

## 2015-04-26 DIAGNOSIS — I5022 Chronic systolic (congestive) heart failure: Secondary | ICD-10-CM

## 2015-04-26 DIAGNOSIS — I5032 Chronic diastolic (congestive) heart failure: Secondary | ICD-10-CM | POA: Diagnosis not present

## 2015-04-26 DIAGNOSIS — I739 Peripheral vascular disease, unspecified: Secondary | ICD-10-CM | POA: Diagnosis not present

## 2015-04-26 DIAGNOSIS — I10 Essential (primary) hypertension: Secondary | ICD-10-CM

## 2015-04-26 DIAGNOSIS — E7800 Pure hypercholesterolemia, unspecified: Secondary | ICD-10-CM

## 2015-04-26 NOTE — Patient Instructions (Signed)
Your physician has requested that you have an echocardiogram in 6 months (prior to office visit). Echocardiography is a painless test that uses sound waves to create images of your heart. It provides your doctor with information about the size and shape of your heart and how well your heart's chambers and valves are working. This procedure takes approximately one hour. There are no restrictions for this procedure.  Dr Debara Pickett recommends that you schedule a follow-up appointment in 6 months. You will receive a reminder letter in the mail two months in advance. If you don't receive a letter, please call our office to schedule the follow-up appointment.  If you need a refill on your cardiac medications before your next appointment, please call your pharmacy.

## 2015-04-29 NOTE — Progress Notes (Signed)
OFFICE NOTE  Chief Complaint:  Routine follow-up  Primary Care Physician: [name], MD  HPI:  [name] is an 82 year old male that I had previously seen in 2011. 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 He underwent stent grafting and did well from that. He has as history of hypertension and aortic sclerosis. Since I saw him last, he has had a number of events including chronic diastolic HF (EF 0000000 in 3/15), presumed CAD, extensive PAD with left femoropopliteal bypass in 1999 preceded by right internal iliac stenting in 1997. 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 He said he also had stent grafting of a penetrating ulcer in the descending and thoracic aorta (done at [facility], 2003). He has diabetes on oral medications as well as hypertension and hyperlipidemia.   He was recently admitted 3/16/-06/16/13 for increased weight gain and worsening LE edema. ECHO showed EF 55-60% with mild diastolic dysfx. Discharge weight 146 lbs. Hospitalized in Feb 2015 with the flu and NSTEMI EF at that time 45%. He was seen last week by Dr. Haroldine Laws in the heart failure clinic for followup. It was noted that he was euvolemic and was maintaining his dry weight without worsening shortness of breath. He was subsequently referred back to me for followup.  He has no complaints today.  I saw [name] back in the office today. He denies any worsening shortness of breath or chest discomfort. He continues to be as active as he can be but walks with a walker. He's had no worsening swelling or weight gain since I last saw him in the office. His weight has generally stayed within 1 or 2 pounds of his dry weight. There has been no further TIAs or strokes that I'm aware of. He was recently seen by Dr. Oneida Alar who had performed a CTA which shows  stable aortic graft architecture.  [name] returns today for follow-up. Of note his blood pressure is slightly low today with systolic below [id number]. He says it usually ranges between [id number] and [id number] systolic. He denies any chest pain or shortness of breath. He has a stable aortic murmur and echo last year showed a roughly well preserved EF without any significant aortic valvular disease although it was of decreased quality.  [name] returns today for follow-up. He denies any chest pain or worsening shortness of breath. Blood pressures been well controlled. He has had a stable aortic sclerosis without stenosis by echocardiography. He denies any lower extremity claudication. He is followed by Dr. Oneida Alar for his vascular disease.  PMHx:  Past Medical History  Diagnosis Date  . Diabetes mellitus, type 2 ([place])   . Hypertension   . GERD (gastroesophageal reflux disease)   . Osteoporosis, senile   . High cholesterol   . Seasonal allergies   . MRSA infection     s/p '02-no problems now  . Anemia   . Stroke ([place])   . Transfusion history     '02- back surgery  . PAD (peripheral artery disease) (H[facility])     s/p L Fem-Pop Bypass; R Iliac PTA  . Left carotid artery occlusion     moderate R carotid disease; L Common Carotid Bypassed during Arch Repair.  . H/O thoracic aortic aneurysm repair     Stent Graft (penetrating ulcer) - r/t staphylococcal infection  .  Myocardial infarction [facility]) Feb. 9, 2015    Mild Heart attack  . Flu Feb. 9, 2015    ?  Mild Heart Attack  . CHF (congestive heart failure) ([place]) 05/2013  . History of tobacco use     50 pack year history, quit in 2002    Past Surgical History  Procedure Laterality Date  . Back surgery  2001  . Cleft lip repair    . Pr vein bypass graft,aorto-fem-pop  1999  . Joint replacement Right 2003    right hip   . Colonoscopy with propofol N/A 08/10/2012    Procedure: COLONOSCOPY WITH PROPOFOL;  Surgeon: Garlan Fair, MD;  Location: [facility];   Service: Endoscopy;  Laterality: N/A;  . Esophagogastroduodenoscopy (egd) with propofol N/A 08/10/2012    Procedure: ESOPHAGOGASTRODUODENOSCOPY (EGD) WITH PROPOFOL;  Surgeon: Garlan Fair, MD;  Location: [facility];  Service: Endoscopy;  Laterality: N/A;  . Iliac artery stent Right     PTA -STENT  . 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 Hemiarthroplasty hip Right   . Thoracic aortic endovascular stent graft  2003    @ [facility]  - for Penetrating Ulcer/Aneurysm (cardiac cath done 01/08/2001 by Dr. Antionette Poles)  . Vascular surgery    . Transthoracic echocardiogram  10/2009    EF=>55%, LA mildly dilated, mild mitral annular calfication, mild MR, mild TR, RVSP 30-51mmHg, AV mildly sclerotic, trace pulm valve regurg    FAMHx:  Family History  Problem Relation Age of Onset  . Diabetes Mother   . Hypertension Mother     heart disese, died of MI at 44  . Hyperlipidemia Mother   . Diabetes Sister     also Alzheimers  . Heart disease Son     Heart Disease before age 35  . Heart disease Father     CHF  . Heart attack Father   . Arthritis Father   . Marfan syndrome Daughter   . AAA (abdominal aortic aneurysm) Son     repair in 82s    SOCHx:   reports that he quit smoking about 14 years ago. He has never used smokeless tobacco. He reports that he does not drink alcohol or use illicit drugs.  ALLERGIES:  Allergies  Allergen Reactions  . Codeine Nausea And Vomiting    ROS: A comprehensive review of systems was negative.  HOME MEDS: Current Outpatient Prescriptions  Medication Sig Dispense Refill  . amoxicillin (AMOXIL) 500 MG capsule Only used for dental appointments  1  . aspirin EC 81 MG tablet Take 81 mg by mouth every morning.     Marland Kitchen atorvastatin (LIPITOR) 40 MG tablet Take 20 mg by mouth every evening.     .  carvedilol (COREG) 6.25 MG tablet Take 6.25 mg by mouth 2 (two) times daily with a meal.    . Cholecalciferol (VITAMIN D) 2000 UNITS tablet Take 2,000 Units by mouth daily.    Marland Kitchen diltiazem (CARTIA XT) 240 MG 24 hr capsule Take 240 mg by mouth daily.    . diphenhydrAMINE (BENADRYL) 25 MG tablet Take 25 mg by mouth at bedtime.    Marland Kitchen doxazosin (CARDURA) 2 MG tablet Take 2 mg by mouth daily.     . Ferrous Gluconate (IRON) 246 (28 FE) MG TABS Take 2 tablets by mouth daily.    Marland Kitchen  fluticasone (FLONASE) 50 MCG/ACT nasal spray Place 2 sprays into the nose daily.    . furosemide (LASIX) 20 MG tablet Take 1 tablet by mouth every other day. Take 1 tab every other day depending on weight gain    . furosemide (LASIX) 40 MG tablet Take 20 mg by mouth every other day.     Marland Kitchen glimepiride (AMARYL) 2 MG tablet Take 2 mg by mouth daily as needed.     Marland Kitchen HYDROcodone-acetaminophen (NORCO/VICODIN) 5-325 MG per tablet Take 1 tablet by mouth every 6 (six) hours as needed for pain.     . isosorbide dinitrate (ISORDIL) 10 MG tablet Take 1 tablet (10 mg total) by mouth 3 (three) times daily. 90 tablet 2  . loratadine (CLARITIN) 10 MG tablet Take 10 mg by mouth daily.    . Multiple Vitamin (MULTIVITAMIN) capsule Take 1 capsule by mouth daily.    . niacin (SLO-NIACIN) 500 MG tablet Take 500 mg by mouth daily.     . Omega-3 Fatty Acids (FISH OIL) 1200 MG CAPS Take 4 capsules by mouth every morning.     Marland Kitchen omeprazole (PRILOSEC) 20 MG capsule Take 20 mg by mouth daily.    . ONE TOUCH ULTRA TEST test strip 1 strip by Other route daily. Use 1 strip to check glucose in the mornings daily  5  . polyethylene glycol (MIRALAX / GLYCOLAX) packet Take 17 g by mouth daily.    Marland Kitchen PREVIDENT 5000 BOOSTER PLUS 1.1 % PSTE Use once in place of toothpaste brush 2 minutes then spit. DO NOT RINSE  0  . temazepam (RESTORIL) 30 MG capsule Take 30 mg by mouth at bedtime as needed for sleep. For sleep     No current facility-administered medications for  this visit.    LABS/IMAGING: No results found for this or any previous visit (from the past 48 hour(s)). No results found.  VITALS: BP 122/60 mmHg  Pulse 77  Ht 5\' 4"  (1.626 m)  Wt 147 lb 6 oz (66.849 kg)  BMI 25.28 kg/m2  EXAM: General appearance: alert and no distress Neck: no JVD and bilateral carotid bruits, worse on right than left Lungs: clear to auscultation bilaterally Heart: regular rate and rhythm, S1, S2 normal and systolic murmur: early systolic 3/6, crescendo at 2nd right intercostal space Abdomen: soft, non-tender; bowel sounds normal; no masses,  no organomegaly Extremities: extremities normal, atraumatic, no cyanosis or edema Pulses: 2+ and symmetric Skin: Skin color, texture, turgor normal. No rashes or lesions Neurologic: Mental status: Alert, oriented, thought content appropriate, Speech impairment Psych: Normal mood, affect  EKG: Sinus rhythm at 77  ASSESSMENT: 1. Chronic diastolic heart failure, EF 55-60% 2. Peripheral arterial disease with bilateral carotid artery disease 3. History of L fem-pop, right femoral PTA 4. History of mycotic thoracic aortic aneurysm s/p stent graft repair 5. Stroke 6. DM2 7. HTN 8. Dyslipidemia 9. History of NSTEMI 10. Systolic murmur of aortic sclerosis  PLAN: 1.   Mr. Cirillo seems to be doing fairly well. He is managed at a little bit of weight back with his diet but I do not suspect any diastolic heart failure. He denies any shortness of breath or chest pain. Blood pressure is well-controlled. His cholesterol is followed by his primary care provider. His aortic sclerosis murmur is stable, but he has not had reassessment of his heart in some time. I like to get a repeat echocardiogram in 6 months. Peripheral arterial disease is followed by Dr. Oneida Alar. Overall seems to  be doing well. Plan to see him back in 6 months or sooner as necessary.Pixie Casino, MD, [facility] Attending Cardiologist Ramos C  Hilty 04/29/2015, 5:57 PM

## 2015-10-22 ENCOUNTER — Other Ambulatory Visit (HOSPITAL_COMMUNITY): Payer: Self-pay

## 2015-10-22 ENCOUNTER — Ambulatory Visit (HOSPITAL_COMMUNITY): Payer: Medicare Other | Attending: Internal Medicine

## 2015-10-22 DIAGNOSIS — I517 Cardiomegaly: Secondary | ICD-10-CM | POA: Diagnosis not present

## 2015-10-22 DIAGNOSIS — I2510 Atherosclerotic heart disease of native coronary artery without angina pectoris: Secondary | ICD-10-CM | POA: Insufficient documentation

## 2015-10-22 DIAGNOSIS — Z87891 Personal history of nicotine dependence: Secondary | ICD-10-CM | POA: Diagnosis not present

## 2015-10-22 DIAGNOSIS — I5032 Chronic diastolic (congestive) heart failure: Secondary | ICD-10-CM

## 2015-10-22 DIAGNOSIS — I351 Nonrheumatic aortic (valve) insufficiency: Secondary | ICD-10-CM | POA: Insufficient documentation

## 2015-10-22 DIAGNOSIS — I340 Nonrheumatic mitral (valve) insufficiency: Secondary | ICD-10-CM | POA: Insufficient documentation

## 2015-10-22 DIAGNOSIS — I252 Old myocardial infarction: Secondary | ICD-10-CM | POA: Diagnosis not present

## 2015-10-29 ENCOUNTER — Ambulatory Visit (INDEPENDENT_AMBULATORY_CARE_PROVIDER_SITE_OTHER): Payer: Medicare Other | Admitting: Internal Medicine

## 2015-10-29 ENCOUNTER — Encounter: Payer: Self-pay | Admitting: Internal Medicine

## 2015-10-29 VITALS — BP 118/66 | HR 60 | Ht 64.0 in | Wt 148.8 lb

## 2015-10-29 DIAGNOSIS — I6523 Occlusion and stenosis of bilateral carotid arteries: Secondary | ICD-10-CM

## 2015-10-29 DIAGNOSIS — I10 Essential (primary) hypertension: Secondary | ICD-10-CM | POA: Diagnosis not present

## 2015-10-29 DIAGNOSIS — E7800 Pure hypercholesterolemia, unspecified: Secondary | ICD-10-CM | POA: Diagnosis not present

## 2015-10-29 DIAGNOSIS — I739 Peripheral vascular disease, unspecified: Secondary | ICD-10-CM

## 2015-10-29 DIAGNOSIS — I5022 Chronic systolic (congestive) heart failure: Secondary | ICD-10-CM | POA: Diagnosis not present

## 2015-10-29 NOTE — Progress Notes (Signed)
OFFICE NOTE  Chief Complaint:  Routine follow-up  Primary Care Physician: [name], MD  HPI:  [name] is an 82 year old male that I had previously seen in 2011. 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 He underwent stent grafting and did well from that. He has as history of hypertension and aortic sclerosis. Since I saw him last, he has had a number of events including chronic diastolic HF (EF 0000000 in 3/15), presumed CAD, extensive PAD with left femoropopliteal bypass in 1999 preceded by right internal iliac stenting in 1997. 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 He said he also had stent grafting of a penetrating ulcer in the descending and thoracic aorta (done at [facility], 2003). He has diabetes on oral medications as well as hypertension and hyperlipidemia.   He was recently admitted 3/16/-06/16/13 for increased weight gain and worsening LE edema. ECHO showed EF 55-60% with mild diastolic dysfx. Discharge weight 146 lbs. Hospitalized in Feb 2015 with the flu and NSTEMI EF at that time 45%. He was seen last week by Dr. Haroldine Laws in the heart failure clinic for followup. It was noted that he was euvolemic and was maintaining his dry weight without worsening shortness of breath. He was subsequently referred back to me for followup.  He has no complaints today.  I saw [name] back in the office today. He denies any worsening shortness of breath or chest discomfort. He continues to be as active as he can be but walks with a walker. He's had no worsening swelling or weight gain since I last saw him in the office. His weight has generally stayed within 1 or 2 pounds of his dry weight. There has been no further TIAs or strokes that I'm aware of. He was recently seen by Dr. Oneida Alar who had performed a CTA which shows  stable aortic graft architecture.  [name] returns today for follow-up. Of note his blood pressure is slightly low today with systolic below [id number]. He says it usually ranges between [id number] and [id number] systolic. He denies any chest pain or shortness of breath. He has a stable aortic murmur and echo last year showed a roughly well preserved EF without any significant aortic valvular disease although it was of decreased quality.  [name] returns today for follow-up. He denies any chest pain or worsening shortness of breath. Blood pressures been well controlled. He has had a stable aortic sclerosis without stenosis by echocardiography. He denies any lower extremity claudication. He is followed by Dr. Oneida Alar for his vascular disease.  10/29/2015  [name] returns today for follow-up. He denies any new symptoms such as shortness of breath, worsening fatigue or chest pain. I repeated an echo for follow-up of diastolic congestive heart failure. Unfortunately it shows newly reduced systolic dysfunction with an EF of 45-50% and global hypokinesis. The etiology of this is not clear although may be related to worsening coronary disease however he denies any angina or any new symptoms of decompensated congestive heart failure. Weight has been very stable. We discussed options for workup including stress testing or invasive procedures although since he is asymptomatic it difficult to recommend this. Another option would be trying to optimize her adjust his medical therapy for most heart failure benefit. Currently he is on beta blocker but not on ACE inhibitor or ARB  or an ARNI. He tells me he is scheduled to get lab work tomorrow for an upcoming visit with Dr. Joylene Draft.  PMHx:  Past Medical History:  Diagnosis Date  . Anemia   . CHF (congestive heart failure) ([place]) 05/2013  . Diabetes mellitus, type 2 ([place])   . Flu Feb. 9, 2015   ?  Mild Heart Attack  . GERD (gastroesophageal reflux disease)   . H/O thoracic aortic  aneurysm repair    Stent Graft (penetrating ulcer) - r/t staphylococcal infection  . High cholesterol   . History of tobacco use    50 pack year history, quit in 2002  . Hypertension   . Left carotid artery occlusion    moderate R carotid disease; L Common Carotid Bypassed during Arch Repair.  Marland Kitchen MRSA infection    s/p '02-no problems now  . Myocardial infarction [facility]) Feb. 9, 2015   Mild Heart attack  . Osteoporosis, senile   . PAD (peripheral artery disease) ([facility])    s/p L Fem-Pop Bypass; R Iliac PTA  . Seasonal allergies   . Stroke ([place])   . Transfusion history    '02- back surgery    Past Surgical History:  Procedure Laterality Date  . 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 BACK SURGERY  2001  . CLEFT LIP REPAIR    . COLONOSCOPY WITH PROPOFOL N/A 08/10/2012   Procedure: COLONOSCOPY WITH PROPOFOL;  Surgeon: Garlan Fair, MD;  Location: [facility] END[facility]COPY;  Service: Endoscopy;  Laterality: N/A;  . ESOPHAGOGASTRODUODENOSCOPY (EGD) WITH PROPOFOL N/A 08/10/2012   Procedure: ESOPHAGOGASTRODUODENOSCOPY (EGD) WITH PROPOFOL;  Surgeon: Garlan Fair, MD;  Location: [facility];  Service: Endoscopy;  Laterality: N/A;  . HEMIARTHROPLASTY HIP Right   . ILIAC ARTERY STENT Right    PTA -STENT  . JOINT REPLACEMENT Right 2003   right hip   . PR VEIN BYPASS GRAFT,AORTO-FEM-POP  1999  . THORACIC AORTIC ENDOVASCULAR STENT GRAFT  2003   @ [facility]  - for Penetrating Ulcer/Aneurysm (cardiac cath done 01/08/2001 by Dr. Antionette Poles)  . TRANSTHORACIC ECHOCARDIOGRAM  10/2009   EF=>55%, LA mildly dilated, mild mitral annular calfication, mild MR, mild TR, RVSP 30-74mmHg, AV mildly sclerotic, trace pulm valve regurg  . VASCULAR SURGERY      FAMHx:  Family History  Problem Relation Age of Onset  . Diabetes Mother   . Hypertension Mother      heart disese, died of MI at 82  . Hyperlipidemia Mother   . Diabetes Sister     also Alzheimers  . Heart disease Son     Heart Disease before age 66  . Heart disease Father     CHF  . Heart attack Father   . Arthritis Father   . Marfan syndrome Daughter   . AAA (abdominal aortic aneurysm) Son     repair in 16s    SOCHx:   reports that he quit smoking about 15 years ago. He has never used smokeless tobacco. He reports that he does not drink alcohol or use drugs.  ALLERGIES:  Allergies  Allergen Reactions  . Codeine Nausea And Vomiting    ROS: A comprehensive review of systems was negative.  HOME MEDS: Current Outpatient Prescriptions  Medication Sig Dispense Refill  . amoxicillin (AMOXIL) 500 MG capsule Only used for dental  appointments  1  . aspirin EC 81 MG tablet Take 81 mg by mouth every morning.     Marland Kitchen atorvastatin (LIPITOR) 40 MG tablet Take 20 mg by mouth every evening.     . carvedilol (COREG) 6.25 MG tablet Take 6.25 mg by mouth 2 (two) times daily with a meal.    . Cholecalciferol (VITAMIN D) 2000 UNITS tablet Take 2,000 Units by mouth daily.    Marland Kitchen diltiazem (CARTIA XT) 240 MG 24 hr capsule Take 240 mg by mouth daily.    . diphenhydrAMINE (BENADRYL) 25 MG tablet Take 25 mg by mouth at bedtime.    Marland Kitchen doxazosin (CARDURA) 2 MG tablet Take 2 mg by mouth daily.     . Ferrous Gluconate (IRON) 246 (28 FE) MG TABS Take 2 tablets by mouth daily.    . fluticasone (FLONASE) 50 MCG/ACT nasal spray Place 2 sprays into the nose daily.    . furosemide (LASIX) 20 MG tablet Take 1 tablet by mouth every other day. Take 1 tab every other day depending on weight gain    . glimepiride (AMARYL) 2 MG tablet Take 2 mg by mouth daily as needed.     Marland Kitchen HYDROcodone-acetaminophen (NORCO/VICODIN) 5-325 MG per tablet Take 1 tablet by mouth every 6 (six) hours as needed for pain.     . isosorbide dinitrate (ISORDIL) 10 MG tablet Take 1 tablet (10 mg total) by mouth 3 (three) times daily. 90 tablet 2   . loratadine (CLARITIN) 10 MG tablet Take 10 mg by mouth daily.    . Multiple Vitamin (MULTIVITAMIN) capsule Take 1 capsule by mouth daily.    . niacin (SLO-NIACIN) 500 MG tablet Take 500 mg by mouth daily.     . Omega-3 Fatty Acids (FISH OIL) 1200 MG CAPS Take 4 capsules by mouth every morning.     Marland Kitchen omeprazole (PRILOSEC) 20 MG capsule Take 20 mg by mouth daily.    . ONE TOUCH ULTRA TEST test strip 1 strip by Other route daily. Use 1 strip to check glucose in the mornings daily  5  . polyethylene glycol (MIRALAX / GLYCOLAX) packet Take 17 g by mouth daily.    Marland Kitchen PREVIDENT 5000 BOOSTER PLUS 1.1 % PSTE Use once in place of toothpaste brush 2 minutes then spit. DO NOT RINSE  0  . temazepam (RESTORIL) 30 MG capsule Take 30 mg by mouth at bedtime as needed for sleep. For sleep     No current facility-administered medications for this visit.     LABS/IMAGING: No results found for this or any previous visit (from the past 48 hour(s)). No results found.  VITALS: BP 118/66 (BP Location: Right Arm, Patient Position: Sitting, Cuff Size: Normal)   Pulse 60   Ht 5\' 4"  (1.626 m)   Wt 148 lb 12.8 oz (67.5 kg)   SpO2 96%   BMI 25.54 kg/m   EXAM: General appearance: alert and no distress Neck: no JVD and bilateral carotid bruits, worse on right than left Lungs: clear to auscultation bilaterally Heart: regular rate and rhythm, S1, S2 normal and systolic murmur: early systolic 3/6, crescendo at 2nd right intercostal space Abdomen: soft, non-tender; bowel sounds normal; no masses,  no organomegaly Extremities: extremities normal, atraumatic, no cyanosis or edema Pulses: 2+ and symmetric Skin: Skin color, texture, turgor normal. No rashes or lesions Neurologic: Mental status: Alert, oriented, thought content appropriate, Speech impairment Psych: Normal mood, affect  EKG: Sinus rhythm at 60  ASSESSMENT: 1. New systolic CHF (  EF 45-50% - 2017), history of diastolic heart failure 2. Peripheral  arterial disease with bilateral carotid artery disease 3. History of L fem-pop, right femoral PTA 4. History of mycotic thoracic aortic aneurysm s/p stent graft repair 5. Stroke 6. DM2 7. HTN 8. Dyslipidemia 9. History of NSTEMI - presumed CAD, asymptomatic 10. Systolic murmur of aortic sclerosis  PLAN: 1.   Mr. Talbot has new findings of cardiomyopathy with EF 45-50% and a history of diastolic heart failure. He appears clinically compensated and weight is been stable. He denies any chest pain, worsening shortness of breath or change in exercise tolerance. He does have a history of NSTEMI in the past and likely has underlying coronary disease. Since he is asymptomatic, difficult to recommend further invasive procedures and also because of the fact that he is minimally active. I would recommend medication adjustment at this time and will obtain lab work from his primary care provider regarding his renal function. If creatinine is fairly stable, I would recommend starting an ACE inhibitor or ARB and discontinuing his Cardizem to allow room for that.   Follow-up with me in 2 months.   Pixie Casino, MD, [facility] Attending Cardiologist [place] [facility] Osiris Odriscoll 10/29/2015, 2:00 PM

## 2015-10-29 NOTE — Patient Instructions (Signed)
Medication Instructions:  Your physician recommends that you continue on your current medications as directed. Please refer to the Current Medication list given to you today.  Labwork: NONE ORDERED  Testing/Procedures: NONE ORDERED  Follow-Up: Your physician recommends that you schedule a follow-up appointment in: 2 MONTHS WITH DR HILTY.  Any Other Special Instructions Will Be Listed Below (If Applicable).     If you need a refill on your cardiac medications before your next appointment, please call your pharmacy.

## 2015-11-02 ENCOUNTER — Telehealth: Payer: Self-pay | Admitting: Internal Medicine

## 2015-11-02 DIAGNOSIS — Z79899 Other long term (current) drug therapy: Secondary | ICD-10-CM

## 2015-11-02 MED ORDER — LISINOPRIL 20 MG PO TABS: 20.0000 mg | ORAL_TABLET | Freq: Every day | ORAL | 3 refills | Status: DC

## 2015-11-02 NOTE — Telephone Encounter (Signed)
Lab orders faxed Patient notified to have labs done early the week of 8/14

## 2015-11-02 NOTE — Telephone Encounter (Signed)
Patient notified of MD recommendations and agrees w/plan. He prefers to have lab work done @ Dr. Silvestre Mesi office. LM for Dr. Silvestre Mesi CMA to call back to confirm this is OK and to provide fax so that lab requisition can be sent over.   BMET & lisinopril ordered

## 2015-11-02 NOTE — Telephone Encounter (Signed)
-----   Message from Pixie Casino, MD sent at 11/02/2015  2:01 PM EDT ----- Regarding: RE: labs Creatinine stable at 1.4. D/c diltiazem and start lisinopril 20 mg daily. Recheck BMET in 1 week after starting.  Dr. Lemmie Evens  ----- Message ----- From: Fidel Levy, RN Sent: 11/02/2015   9:34 AM To: Pixie Casino, MD Subject: labs                                           Labs in "for review" folder - needed them to decide about adding ACE-I or ARB

## 2015-11-02 NOTE — Telephone Encounter (Signed)
New message   Fax number that she is requesting the RN use so that a lab requisition can be sent over.  [phone]

## 2015-11-15 ENCOUNTER — Observation Stay (HOSPITAL_COMMUNITY)
Admission: EM | Admit: 2015-11-15 | Discharge: 2015-11-17 | Disposition: A | Discharge status: Home / Self Care | Payer: Medicare Other | Attending: Family Medicine | Admitting: Family Medicine

## 2015-11-15 ENCOUNTER — Encounter (HOSPITAL_COMMUNITY): Payer: Self-pay | Admitting: Emergency Medicine

## 2015-11-15 ENCOUNTER — Emergency Department (HOSPITAL_COMMUNITY): Payer: Medicare Other

## 2015-11-15 DIAGNOSIS — R42 Dizziness and giddiness: Secondary | ICD-10-CM

## 2015-11-15 DIAGNOSIS — E875 Hyperkalemia: Principal | ICD-10-CM | POA: Diagnosis present

## 2015-11-15 DIAGNOSIS — Z79899 Other long term (current) drug therapy: Secondary | ICD-10-CM | POA: Insufficient documentation

## 2015-11-15 DIAGNOSIS — I110 Hypertensive heart disease with heart failure: Secondary | ICD-10-CM | POA: Insufficient documentation

## 2015-11-15 DIAGNOSIS — Z96641 Presence of right artificial hip joint: Secondary | ICD-10-CM | POA: Diagnosis not present

## 2015-11-15 DIAGNOSIS — I252 Old myocardial infarction: Secondary | ICD-10-CM | POA: Insufficient documentation

## 2015-11-15 DIAGNOSIS — E119 Type 2 diabetes mellitus without complications: Secondary | ICD-10-CM

## 2015-11-15 DIAGNOSIS — H539 Unspecified visual disturbance: Secondary | ICD-10-CM

## 2015-11-15 DIAGNOSIS — K219 Gastro-esophageal reflux disease without esophagitis: Secondary | ICD-10-CM | POA: Diagnosis not present

## 2015-11-15 DIAGNOSIS — E872 Acidosis, unspecified: Secondary | ICD-10-CM

## 2015-11-15 DIAGNOSIS — N179 Acute kidney failure, unspecified: Secondary | ICD-10-CM | POA: Insufficient documentation

## 2015-11-15 DIAGNOSIS — R269 Unspecified abnormalities of gait and mobility: Secondary | ICD-10-CM | POA: Insufficient documentation

## 2015-11-15 DIAGNOSIS — E785 Hyperlipidemia, unspecified: Secondary | ICD-10-CM

## 2015-11-15 DIAGNOSIS — Z87891 Personal history of nicotine dependence: Secondary | ICD-10-CM | POA: Diagnosis not present

## 2015-11-15 DIAGNOSIS — E7800 Pure hypercholesterolemia, unspecified: Secondary | ICD-10-CM | POA: Diagnosis not present

## 2015-11-15 DIAGNOSIS — Z7984 Long term (current) use of oral hypoglycemic drugs: Secondary | ICD-10-CM | POA: Diagnosis not present

## 2015-11-15 DIAGNOSIS — I5022 Chronic systolic (congestive) heart failure: Secondary | ICD-10-CM | POA: Insufficient documentation

## 2015-11-15 DIAGNOSIS — Z7982 Long term (current) use of aspirin: Secondary | ICD-10-CM | POA: Insufficient documentation

## 2015-11-15 DIAGNOSIS — I6521 Occlusion and stenosis of right carotid artery: Secondary | ICD-10-CM | POA: Diagnosis not present

## 2015-11-15 DIAGNOSIS — E1169 Type 2 diabetes mellitus with other specified complication: Secondary | ICD-10-CM | POA: Diagnosis present

## 2015-11-15 DIAGNOSIS — Z8673 Personal history of transient ischemic attack (TIA), and cerebral infarction without residual deficits: Secondary | ICD-10-CM | POA: Insufficient documentation

## 2015-11-15 DIAGNOSIS — H538 Other visual disturbances: Secondary | ICD-10-CM | POA: Diagnosis present

## 2015-11-15 DIAGNOSIS — I10 Essential (primary) hypertension: Secondary | ICD-10-CM | POA: Diagnosis present

## 2015-11-15 HISTORY — DX: Other intervertebral disc degeneration, lumbar region without mention of lumbar back pain or lower extremity pain: M51.369

## 2015-11-15 HISTORY — DX: Other intervertebral disc degeneration, lumbar region: M51.36

## 2015-11-15 LAB — BASIC METABOLIC PANEL
Anion gap: 7 (ref 5–15)
BUN: 50 mg/dL — ABNORMAL HIGH (ref 6–20)
CO2: 17 mmol/L — ABNORMAL LOW (ref 22–32)
Calcium: 8.8 mg/dL — ABNORMAL LOW (ref 8.9–10.3)
Chloride: 112 mmol/L — ABNORMAL HIGH (ref 101–111)
Creatinine, Ser: 2.35 mg/dL — ABNORMAL HIGH (ref 0.61–1.24)
GFR calc Af Amer: 28 mL/min — ABNORMAL LOW (ref >60)
GFR calc non Af Amer: 24 mL/min — ABNORMAL LOW (ref >60)
Glucose, Bld: 133 mg/dL — ABNORMAL HIGH (ref 65–99)
Potassium: 6 mmol/L — ABNORMAL HIGH (ref 3.5–5.1)
Sodium: 136 mmol/L (ref 135–145)

## 2015-11-15 LAB — POTASSIUM: POTASSIUM: 4.9 mmol/L (ref 3.5–5.1)

## 2015-11-15 LAB — PROTIME-INR
INR: 1.23
Prothrombin Time: 15.6 seconds — ABNORMAL HIGH (ref 11.4–15.2)

## 2015-11-15 LAB — RAPID URINE DRUG SCREEN, HOSP PERFORMED
Amphetamines: NOT DETECTED
Barbiturates: NOT DETECTED
Benzodiazepines: POSITIVE — AB
Cocaine: NOT DETECTED
Opiates: POSITIVE — AB
Tetrahydrocannabinol: NOT DETECTED

## 2015-11-15 LAB — URINALYSIS, ROUTINE W REFLEX MICROSCOPIC
Bilirubin Urine: NEGATIVE
Glucose, UA: NEGATIVE mg/dL
Hgb urine dipstick: NEGATIVE
Ketones, ur: NEGATIVE mg/dL
Leukocytes, UA: NEGATIVE
Nitrite: NEGATIVE
Protein, ur: NEGATIVE mg/dL
Specific Gravity, Urine: 1.02 (ref 1.005–1.030)
pH: 5 (ref 5.0–8.0)

## 2015-11-15 LAB — I-STAT TROPONIN, ED: Troponin i, poc: 0.01 ng/mL (ref 0.00–0.08)

## 2015-11-15 LAB — ETHANOL: Alcohol, Ethyl (B): <5 mg/dL (ref <5)

## 2015-11-15 LAB — APTT: aPTT: 28 seconds (ref 24–36)

## 2015-11-15 MED ORDER — SODIUM CHLORIDE 0.9 % IV BOLUS (SEPSIS)
500.0000 mL | Freq: Once | INTRAVENOUS | Status: AC
  Administered 2015-11-15: 500 mL via INTRAVENOUS

## 2015-11-15 MED ORDER — HYDROCODONE-ACETAMINOPHEN 5-325 MG PO TABS
1.0000 | ORAL_TABLET | Freq: Four times a day (QID) | ORAL | Status: DC | PRN
  Administered 2015-11-15: 1 via ORAL
  Filled 2015-11-15: qty 1

## 2015-11-15 MED ORDER — FUROSEMIDE 10 MG/ML IJ SOLN
40.0000 mg | Freq: Once | INTRAMUSCULAR | Status: AC
  Administered 2015-11-15: 40 mg via INTRAVENOUS
  Filled 2015-11-15: qty 4

## 2015-11-15 NOTE — H&P (Signed)
History and Physical    [name] [id number] DOB: 08-05-1930 DOA: 11/15/2015  PCP: [name], MD Consultants:  [facility] - cardiology Patient coming from: home - lives with son, Randall Hiss, who is [employer]  Chief Complaint:  Blurred vision, dizziness  HPI: [name] is a 82 y.o. male with medical history significant of remote CVA, PAS, MI, DM, HLD, CHF, and AAA s/p repair.  Patient reports a lot of dizziness and left eye was really blurry yesterday and today.  Noticed eye blurriness yesterday, dizziness started today.  Symptoms resolved at this time.  No change in appetite.  No other changes.   ED Course: Per PA-C Humes: 10:41 PM Patient care assumed from [facility], PA-C at shift change with MRI pending to evaluate for stroke. Case discussed with Dr. Shon Hale who has reviewed the MRI. While radiology has not officially read the results, Dr. Shon Hale does not note any acute changes. Dr. Shon Hale does not believe TIA work up on an emergent basis is indicated in the care of this patient.  Labs reviewed, however, and patient does have hyperkalemia. EKG reviewed and does not show marked peaked T waves. Will redraw to confirm. Lasix and fluids ordered for initial management of hyperkalemia. His creatinine also appears elevated compared to baseline from 1 year ago. There is a normal anion gap metabolic acidosis. Possible that gait changes may be metabolic in etiology. Anticipate observation admission for hyperkalemia and AKI given gait disturbance.  10:52 PM MRI results reviewed; negative for acute or subacute changes.  11:36 PM Case discussed with Dr. Lorin Mercy who will admit for observation. Temporary admission orders placed.   Review of Systems: As per HPI; otherwise 10 point review of systems reviewed and negative.   Ambulatory Status:  Uses cane or walker  Past Medical History:  Diagnosis Date  . Anemia   . CHF (congestive heart failure) ([place]) 05/2013  . Degenerative disc disease,  lumbar   . Diabetes mellitus, type 2 ([place])   . Flu Feb. 9, 2015   ?  Mild Heart Attack  . GERD (gastroesophageal reflux disease)   . H/O thoracic aortic aneurysm repair    Stent Graft (penetrating ulcer) - r/t staphylococcal infection  . High cholesterol   . History of tobacco use    50 pack year history, quit in 2002  . Hypertension   . Left carotid artery occlusion    moderate R carotid disease; L Common Carotid Bypassed during Arch Repair.  Marland Kitchen MRSA infection    s/p '02-no problems now  . Myocardial infarction [facility]) Feb. 9, 2015   Mild Heart attack  . Osteoporosis, senile   . PAD (peripheral artery disease) (HCC)    s/p L Fem-Pop Bypass; R Iliac PTA  . Seasonal allergies   . Stroke ([place])   . Transfusion history    '02- back surgery    Past Surgical History:  Procedure Laterality Date  . 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 BACK SURGERY  2001  . CLEFT LIP REPAIR    . COLONOSCOPY WITH PROPOFOL N/A 08/10/2012   Procedure: COLONOSCOPY WITH PROPOFOL;  Surgeon: Garlan Fair, MD;  Location: [facility];  Service: Endoscopy;  Laterality: N/A;  . ESOPHAGOGASTRODUODENOSCOPY (EGD) WITH PROPOFOL N/A 08/10/2012   Procedure: ESOPHAGOGASTRODUODENOSCOPY (EGD) WITH PROPOFOL;  Surgeon: Garlan Fair,  MD;  Location: [facility];  Service: Endoscopy;  Laterality: N/A;  . HEMIARTHROPLASTY HIP Right   . ILIAC ARTERY STENT Right    PTA -STENT  . JOINT REPLACEMENT Right 2003   right hip   . PR VEIN BYPASS GRAFT,AORTO-FEM-POP  1999  . THORACIC AORTIC ENDOVASCULAR STENT GRAFT  2003   @ [facility]  - for Penetrating Ulcer/Aneurysm (cardiac cath done 01/08/2001 by Dr. Antionette Poles)  . TRANSTHORACIC ECHOCARDIOGRAM  10/2009   EF=>55%, LA mildly dilated, mild mitral annular calfication, mild MR, mild TR, RVSP 30-58mmHg, AV mildly sclerotic,  trace pulm valve regurg  . VASCULAR SURGERY      Social History   Social History  . Marital status: Widowed    Spouse name: N/A  . Number of children: 2  . Years of education: N/A   Occupational History  . retired - Engineer, manufacturing systems    Social History Main Topics  . Smoking status: Former Smoker    Quit date: 09/24/2000  . Smokeless tobacco: Never Used  . Alcohol use No  . Drug use: No  . Sexual activity: Not Currently   Other Topics Concern  . Not on file   Social History Narrative  . No narrative on file    Allergies  Allergen Reactions  . Codeine Nausea And Vomiting    Family History  Problem Relation Age of Onset  . Diabetes Mother   . Hypertension Mother     heart disese, died of MI at 13  . Hyperlipidemia Mother   . Heart disease Father     CHF  . Heart attack Father   . Arthritis Father   . Diabetes Sister     also Alzheimers  . Heart disease Son     Heart Disease before age 87  . Marfan syndrome Daughter   . AAA (abdominal aortic aneurysm) Son     repair in 20s    Prior to Admission medications   Medication Sig Start Date End Date Taking? Authorizing Provider  aspirin EC 81 MG tablet Take 81 mg by mouth every morning.    Yes Historical Provider, MD  atorvastatin (LIPITOR) 40 MG tablet Take 20 mg by mouth every evening.    Yes Historical Provider, MD  CARTIA XT 240 MG 24 hr capsule Take 240 mg by mouth every morning. 09/02/15  Yes Historical Provider, MD  carvedilol (COREG) 6.25 MG tablet Take 6.25 mg by mouth 2 (two) times daily with a meal.   Yes Historical Provider, MD  Cholecalciferol (VITAMIN D) 2000 UNITS tablet Take 2,000 Units by mouth daily.   Yes Historical Provider, MD  diphenhydrAMINE (BENADRYL) 25 MG tablet Take 25 mg by mouth at bedtime.   Yes Historical Provider, MD  doxazosin (CARDURA) 2 MG tablet Take 2 mg by mouth daily.  06/04/13  Yes Historical Provider, MD  Ferrous Gluconate (IRON) 246 (28 FE) MG TABS Take 2 tablets by mouth daily.    Yes Historical Provider, MD  fluticasone (FLONASE) 50 MCG/ACT nasal spray Place 2 sprays into the nose daily.   Yes Historical Provider, MD  furosemide (LASIX) 20 MG tablet Take 20 mg by mouth every other day. Take 1 tab every other day depending on weight gain 02/22/15  Yes Historical Provider, MD  glimepiride (AMARYL) 2 MG tablet Take 0.25 mg by mouth daily as needed (for blood sugar over 135 (1/8 of a 2 mg tablet)).    Yes Historical Provider, MD  HYDROcodone-acetaminophen (NORCO/VICODIN) 5-325 MG per tablet Take 1  tablet by mouth every 6 (six) hours as needed for pain.    Yes Historical Provider, MD  isosorbide dinitrate (ISORDIL) 10 MG tablet Take 1 tablet (10 mg total) by mouth 3 (three) times daily. 05/16/13  Yes Erline Hau, MD  loratadine (CLARITIN) 10 MG tablet Take 10 mg by mouth daily.   Yes Historical Provider, MD  Multiple Vitamin (MULTIVITAMIN) capsule Take 1 capsule by mouth daily.   Yes Historical Provider, MD  niacin (SLO-NIACIN) 500 MG tablet Take 500 mg by mouth daily.    Yes Historical Provider, MD  Omega-3 Fatty Acids (FISH OIL) 1200 MG CAPS Take 4,800 mg by mouth every morning.    Yes Historical Provider, MD  omeprazole (PRILOSEC) 20 MG capsule Take 20 mg by mouth daily.   Yes Historical Provider, MD  ONE TOUCH ULTRA TEST test strip 1 strip by Other route daily. Use 1 strip to check glucose in the mornings daily 04/03/15  Yes Historical Provider, MD  polyethylene glycol (MIRALAX / GLYCOLAX) packet Take 17 g by mouth daily.   Yes Historical Provider, MD  PREVIDENT 5000 BOOSTER PLUS 1.1 % PSTE Use once in place of toothpaste brush 2 minutes then spit. DO NOT RINSE 04/03/15  Yes Historical Provider, MD  temazepam (RESTORIL) 30 MG capsule Take 30 mg by mouth at bedtime as needed for sleep. For sleep   Yes Historical Provider, MD  amoxicillin (AMOXIL) 500 MG capsule Take 2,000 mg by mouth as needed (1 hour before dental appointments).  03/02/15   Historical Provider, MD     Physical Exam: Vitals:   11/16/15 0030 11/16/15 0105 11/16/15 0145 11/16/15 0637  BP: 106/86  112/74 122/74  Pulse: 99  99 94  Resp: (!) 30  (!) 22 20  Temp:  98.3 F (36.8 C) 98.1 F (36.7 C) 98.7 F (37.1 C)  TempSrc:  Oral Oral Oral  SpO2: 99%  100% 96%  Weight:   65 kg (143 lb 3.2 oz)   Height:   5\' 4"  (1.626 m)      General: Appears calm and comfortable and is NAD; is s/p cleft palate repair and is somewhat difficult to understand at times Eyes:  PERRL, EOMI, normal lids, iris ENT:  grossly normal hearing, lips & tongue, mmm; s/p cleft palate repair remotely Neck:  no LAD, masses or thyromegaly Cardiovascular:  RRR, no m/r/g. No LE edema.  Respiratory:  CTA bilaterally, no w/r/r. Normal respiratory effort. Abdomen:  soft, ntnd, NABS Skin:  no rash or induration seen on limited exam Musculoskeletal:  grossly normal tone BUE/BLE, good ROM, no bony abnormality Psychiatric: grossly normal mood and affect, speech fluent and appropriate, AOx3 Neurologic:  CN 2-12 grossly intact, moves all extremities in coordinated fashion, sensation intact  Labs on Admission: I have personally reviewed following labs and imaging studies  CBC:  Recent Labs Lab 11/16/15 0457  WBC 6.8  HGB 11.4*  HCT 33.8*  MCV 95.8  PLT 99991111*   Basic Metabolic Panel:  Recent Labs Lab 11/15/15 1642 11/15/15 2255 11/16/15 0150 11/16/15 0457  NA 136  --  137 139  K 6.0* 4.9 4.9 5.0  CL 112*  --  111 108  CO2 17*  --  20* 21*  GLUCOSE 133*  --  172* 153*  BUN 50*  --  39* 37*  CREATININE 2.35*  --  1.94* 1.75*  CALCIUM 8.8*  --  9.2 9.5   GFR: Estimated Creatinine Clearance: 27.3 mL/min (by C-G formula based on  SCr of 1.75 mg/dL). Liver Function Tests: No results for input(s): AST, ALT, ALKPHOS, BILITOT, PROT, ALBUMIN in the last 168 hours. No results for input(s): LIPASE, AMYLASE in the last 168 hours. No results for input(s): AMMONIA in the last 168 hours. Coagulation  Profile:  Recent Labs Lab 11/15/15 1642  INR 1.23   Cardiac Enzymes: No results for input(s): CKTOTAL, CKMB, CKMBINDEX, TROPONINI in the last 168 hours. BNP (last 3 results) No results for input(s): PROBNP in the last 8760 hours. HbA1C: No results for input(s): HGBA1C in the last 72 hours. CBG:  Recent Labs Lab 11/16/15 0126  GLUCAP 173*   Lipid Profile: No results for input(s): CHOL, HDL, LDLCALC, TRIG, CHOLHDL, LDLDIRECT in the last 72 hours. Thyroid Function Tests: No results for input(s): TSH, T4TOTAL, FREET4, T3FREE, THYROIDAB in the last 72 hours. Anemia Panel: No results for input(s): VITAMINB12, FOLATE, FERRITIN, TIBC, IRON, RETICCTPCT in the last 72 hours. Urine analysis:    Component Value Date/Time   COLORURINE YELLOW 11/15/2015 [place] 11/15/2015 1645   LABSPEC 1.020 11/15/2015 1645   PHURINE 5.0 11/15/2015 [place] 11/15/2015 1645   HGBUR NEGATIVE 11/15/2015 1645   BILIRUBINUR NEGATIVE 11/15/2015 1645   KETONESUR NEGATIVE 11/15/2015 1645   PROTEINUR NEGATIVE 11/15/2015 1645   UROBILINOGEN 0.2 06/13/2013 1400   NITRITE NEGATIVE 11/15/2015 1645   LEUKOCYTESUR NEGATIVE 11/15/2015 1645    Creatinine Clearance: Estimated Creatinine Clearance: 27.3 mL/min (by C-G formula based on SCr of 1.75 mg/dL).  Sepsis Labs: @LABRCNTIP (procalcitonin:4,lacticidven:4) )No results found for this or any previous visit (from the past 240 hour(s)).   Radiological Exams on Admission: Mr Angiogram Head Wo Contrast  Result Date: 11/15/2015 CLINICAL DATA:  82 y/o M; 1 day history of visual changes to the left eye and dizziness. EXAM: MRI HEAD WITHOUT CONTRAST MRA HEAD WITHOUT CONTRAST MRA NECK WITHOUT CONTRAST TECHNIQUE: Multiplanar, multiecho pulse sequences of the brain and surrounding structures were obtained without intravenous contrast. Angiographic images of the Circle of Willis were obtained using MRA technique without intravenous  contrast. Angiographic images of the neck were obtained using MRA technique without intravenous contrast. Carotid stenosis measurements (when applicable) are obtained utilizing NASCET criteria, using the distal internal carotid diameter as the denominator. COMPARISON:  None. FINDINGS: MRI HEAD FINDINGS Brain: Chronic infarcts within the right posterior lateral frontal lobe, right high parietal lobe, and right posterior temporal parietal lobe with some extension into occipital lobe. Background of mild to moderate parenchymal volume loss and chronic microvascular ischemic changes. No diffusion restriction to suggest acute/early subacute infarct. There is hemosiderin staining of the large right posterior temporal/parietal infarct and punctate foci of susceptibility in the right cerebellar hemisphere and left frontal lobe compatible with hemosiderin deposition. No evidence for acute intracranial hemorrhage or focal mass effect. Extra-axial space: No hydrocephalus. No midline shift. No effacement of basilar cisterns. No extra-axial collection is identified. Low-lying cerebellar tonsils 8 mm below the foramen magnum. Other: Mild mucosal thickening of the paranasal sinuses. No abnormal signal of the mastoid air cells. Orbits are unremarkable. Calvarium is unremarkable. MRA HEAD FINDINGS Internal carotid arteries: Patent. Diminished flow related signal within the proximal left cavernous segment probably reflects underlying stenosis. The carotid siphons bilaterally are irregular in contour consistent with atherosclerotic changes. Anterior cerebral arteries:  Patent. Middle cerebral arteries: Patent. Anterior communicating artery: Patent. Irregularity of the left A1 segment with moderate stenosis. Posterior communicating arteries:  Patent. Posterior cerebral arteries: Patent. Short segment of mild stenosis of the left  P1 segment. Basilar artery:  Patent. Vertebral arteries:  Patent.  Left dominant vertebrobasilar system. No  evidence of high-grade stenosis, large vessel occlusion, or aneurysm unless noted above. MRA NECK FINDINGS Right common carotid artery: Patent. Right internal carotid artery: Plaque of the bifurcation and proximal vessel with moderate 50% stenosis. Right vertebral artery: Patent. Left common carotid artery: Patent. Left Internal carotid artery: Patent.  No significant stenosis. Left Vertebral artery: Tortuous and irregular throughout the entirety of the V2 segment which may be due to atherosclerosis or sequelae of fibromuscular dysplasia. IMPRESSION: 1. No evidence of acute/early subacute infarct, intracranial hemorrhage, focal mass effect, or hydrocephalus. 2. Several chronic infarcts in the right frontal lobe, right parietal lobe, and right temporoparietal junction. 3. Mild chronic microvascular ischemic changes and mild to moderate parenchymal volume loss. 4. No large vessel occlusion, high-grade stenosis, or aneurysm of the circle of Willis. Areas of vessel irregularity and mild stenosis consistent with intracranial atherosclerosis. 5. Moderate 50% stenosis of the proximal right internal carotid artery. No significant stenosis of the left carotid artery. 6. Patent vertebral arteries. Tortuous and irregular left vertebral artery probably due to atherosclerosis, possibly sequelae of fibromuscular dysplasia. Electronically Signed   By: Kristine Garbe M.D.   On: 11/15/2015 22:46   Mr Angiogram Neck Wo Contrast  Result Date: 11/15/2015 CLINICAL DATA:  82 y/o M; 1 day history of visual changes to the left eye and dizziness. EXAM: MRI HEAD WITHOUT CONTRAST MRA HEAD WITHOUT CONTRAST MRA NECK WITHOUT CONTRAST TECHNIQUE: Multiplanar, multiecho pulse sequences of the brain and surrounding structures were obtained without intravenous contrast. Angiographic images of the Circle of Willis were obtained using MRA technique without intravenous contrast. Angiographic images of the neck were obtained using MRA  technique without intravenous contrast. Carotid stenosis measurements (when applicable) are obtained utilizing NASCET criteria, using the distal internal carotid diameter as the denominator. COMPARISON:  None. FINDINGS: MRI HEAD FINDINGS Brain: Chronic infarcts within the right posterior lateral frontal lobe, right high parietal lobe, and right posterior temporal parietal lobe with some extension into occipital lobe. Background of mild to moderate parenchymal volume loss and chronic microvascular ischemic changes. No diffusion restriction to suggest acute/early subacute infarct. There is hemosiderin staining of the large right posterior temporal/parietal infarct and punctate foci of susceptibility in the right cerebellar hemisphere and left frontal lobe compatible with hemosiderin deposition. No evidence for acute intracranial hemorrhage or focal mass effect. Extra-axial space: No hydrocephalus. No midline shift. No effacement of basilar cisterns. No extra-axial collection is identified. Low-lying cerebellar tonsils 8 mm below the foramen magnum. Other: Mild mucosal thickening of the paranasal sinuses. No abnormal signal of the mastoid air cells. Orbits are unremarkable. Calvarium is unremarkable. MRA HEAD FINDINGS Internal carotid arteries: Patent. Diminished flow related signal within the proximal left cavernous segment probably reflects underlying stenosis. The carotid siphons bilaterally are irregular in contour consistent with atherosclerotic changes. Anterior cerebral arteries:  Patent. Middle cerebral arteries: Patent. Anterior communicating artery: Patent. Irregularity of the left A1 segment with moderate stenosis. Posterior communicating arteries:  Patent. Posterior cerebral arteries: Patent. Short segment of mild stenosis of the left P1 segment. Basilar artery:  Patent. Vertebral arteries:  Patent.  Left dominant vertebrobasilar system. No evidence of high-grade stenosis, large vessel occlusion, or  aneurysm unless noted above. MRA NECK FINDINGS Right common carotid artery: Patent. Right internal carotid artery: Plaque of the bifurcation and proximal vessel with moderate 50% stenosis. Right vertebral artery: Patent. Left common carotid artery: Patent. Left Internal carotid artery: Patent.  No significant stenosis. Left Vertebral artery: Tortuous and irregular throughout the entirety of the V2 segment which may be due to atherosclerosis or sequelae of fibromuscular dysplasia. IMPRESSION: 1. No evidence of acute/early subacute infarct, intracranial hemorrhage, focal mass effect, or hydrocephalus. 2. Several chronic infarcts in the right frontal lobe, right parietal lobe, and right temporoparietal junction. 3. Mild chronic microvascular ischemic changes and mild to moderate parenchymal volume loss. 4. No large vessel occlusion, high-grade stenosis, or aneurysm of the circle of Willis. Areas of vessel irregularity and mild stenosis consistent with intracranial atherosclerosis. 5. Moderate 50% stenosis of the proximal right internal carotid artery. No significant stenosis of the left carotid artery. 6. Patent vertebral arteries. Tortuous and irregular left vertebral artery probably due to atherosclerosis, possibly sequelae of fibromuscular dysplasia. Electronically Signed   By: Kristine Garbe M.D.   On: 11/15/2015 22:46   Mr Brain Wo Contrast  Result Date: 11/15/2015 CLINICAL DATA:  82 y/o M; 1 day history of visual changes to the left eye and dizziness. EXAM: MRI HEAD WITHOUT CONTRAST MRA HEAD WITHOUT CONTRAST MRA NECK WITHOUT CONTRAST TECHNIQUE: Multiplanar, multiecho pulse sequences of the brain and surrounding structures were obtained without intravenous contrast. Angiographic images of the Circle of Willis were obtained using MRA technique without intravenous contrast. Angiographic images of the neck were obtained using MRA technique without intravenous contrast. Carotid stenosis measurements  (when applicable) are obtained utilizing NASCET criteria, using the distal internal carotid diameter as the denominator. COMPARISON:  None. FINDINGS: MRI HEAD FINDINGS Brain: Chronic infarcts within the right posterior lateral frontal lobe, right high parietal lobe, and right posterior temporal parietal lobe with some extension into occipital lobe. Background of mild to moderate parenchymal volume loss and chronic microvascular ischemic changes. No diffusion restriction to suggest acute/early subacute infarct. There is hemosiderin staining of the large right posterior temporal/parietal infarct and punctate foci of susceptibility in the right cerebellar hemisphere and left frontal lobe compatible with hemosiderin deposition. No evidence for acute intracranial hemorrhage or focal mass effect. Extra-axial space: No hydrocephalus. No midline shift. No effacement of basilar cisterns. No extra-axial collection is identified. Low-lying cerebellar tonsils 8 mm below the foramen magnum. Other: Mild mucosal thickening of the paranasal sinuses. No abnormal signal of the mastoid air cells. Orbits are unremarkable. Calvarium is unremarkable. MRA HEAD FINDINGS Internal carotid arteries: Patent. Diminished flow related signal within the proximal left cavernous segment probably reflects underlying stenosis. The carotid siphons bilaterally are irregular in contour consistent with atherosclerotic changes. Anterior cerebral arteries:  Patent. Middle cerebral arteries: Patent. Anterior communicating artery: Patent. Irregularity of the left A1 segment with moderate stenosis. Posterior communicating arteries:  Patent. Posterior cerebral arteries: Patent. Short segment of mild stenosis of the left P1 segment. Basilar artery:  Patent. Vertebral arteries:  Patent.  Left dominant vertebrobasilar system. No evidence of high-grade stenosis, large vessel occlusion, or aneurysm unless noted above. MRA NECK FINDINGS Right common carotid artery:  Patent. Right internal carotid artery: Plaque of the bifurcation and proximal vessel with moderate 50% stenosis. Right vertebral artery: Patent. Left common carotid artery: Patent. Left Internal carotid artery: Patent.  No significant stenosis. Left Vertebral artery: Tortuous and irregular throughout the entirety of the V2 segment which may be due to atherosclerosis or sequelae of fibromuscular dysplasia. IMPRESSION: 1. No evidence of acute/early subacute infarct, intracranial hemorrhage, focal mass effect, or hydrocephalus. 2. Several chronic infarcts in the right frontal lobe, right parietal lobe, and right temporoparietal junction. 3. Mild chronic microvascular ischemic changes and mild to  moderate parenchymal volume loss. 4. No large vessel occlusion, high-grade stenosis, or aneurysm of the circle of Willis. Areas of vessel irregularity and mild stenosis consistent with intracranial atherosclerosis. 5. Moderate 50% stenosis of the proximal right internal carotid artery. No significant stenosis of the left carotid artery. 6. Patent vertebral arteries. Tortuous and irregular left vertebral artery probably due to atherosclerosis, possibly sequelae of fibromuscular dysplasia. Electronically Signed   By: Kristine Garbe M.D.   On: 11/15/2015 22:46    EKG: Independently reviewed.  NSR with rate 72; nonspecific ST changes with no evidence of acute ischemia  Assessment/Plan Principal Problem:   Hyperkalemia Active Problems:   Hypertension   High cholesterol   Diabetes mellitus, type 2 (HCC)   Acute renal failure (HCC)   Chronic combined systolic and diastolic congestive heart failure (HCC)   Blurred vision   Dizziness and giddiness   Hyperkalemia -Patient with acute kidney injury and subsequent hyperkalemia -Was treated with Lasix and IVF with resultant improvement -Hyperkalemia is now resolved  AKI -Baseline creatinine appears to be about 1.4 -Creatinine on presentation was  2.35 -Improving with IVF  Blurred vision/dizziness -Initial concern was for CVA but this was deemed unlikely by ER, Neurology -Suspect that symptoms were related to patient's AKI -Symptoms are now resolved  HTN -Continue home medications - Coreg, Cartia -Hold Lasix for now -Hold Cardura  DM -takes Amaryl -Will hold oral medication and cover with SSI -Tight control is not essential in this patient at this time  HLD -Continue Lipitor, hold Niacin and fish oil  HFrEF -Recent Echo in 7/17 showed EF 45-50% and grade 2 diastolic dysfunction -Holding Lasix for now -On beta blacker but not ACE - uncertain why not -Given current AKI would not start, but would encourage addition at discharge or short-term f/u -Continue Imdur   DVT prophylaxis: Lovenox Code Status:  Full - confirmed with patient Family Communication: None present  Disposition Plan: Home once clinically improved Consults called: Neurology (by ER) Admission status: Observation - telemetry. Based on improvement overnight, he is likely to be ready for discharge in the next 24 hours.   Karmen Bongo MD [facility]  If 7PM-7AM, please contact night-coverage [email or web address] Password [email or web address]  11/16/2015, 7:00 AM

## 2015-11-15 NOTE — ED Triage Notes (Signed)
Pt arrived from home via EMS with c/o dizziness. Per pt and EMS pt was recently switched from cardizem to lisinopril and then back to cardizem again starting today. Dizziness started today after first dose and is made worse with ambulation. Per EMS + for orthostatic changes.

## 2015-11-15 NOTE — ED Notes (Signed)
Pt to MRI

## 2015-11-15 NOTE — ED Provider Notes (Signed)
10:41 PM Patient care assumed from [facility], PA-C at shift change with MRI pending to evaluate for stroke. Case discussed with Dr. Shon Hale who has reviewed the MRI. While radiology has not officially read the results, Dr. Shon Hale does not note any acute changes. Dr. Shon Hale does not believe TIA work up on an emergent basis is indicated in the care of this patient.  Labs reviewed, however, and patient does have hyperkalemia. EKG reviewed and does not show marked peaked T waves. Will redraw to confirm. Lasix and fluids ordered for initial management of hyperkalemia. His creatinine also appears elevated compared to baseline from 1 year ago. There is a normal anion gap metabolic acidosis. Possible that gait changes may be metabolic in etiology. Anticipate observation admission for hyperkalemia and AKI given gait disturbance.  10:52 PM MRI results reviewed; negative for acute or subacute changes.  11:36 PM Case discussed with Dr. Lorin Mercy who will admit for observation. Temporary admission orders placed.    EKG Interpretation  Date/Time:  Thursday November 15 2015 15:36:32 EDT Ventricular Rate:  72 PR Interval:    QRS Duration: 96 QT Interval:  352 QTC Calculation: 386 R Axis:   85 Text Interpretation:  Sinus rhythm Left atrial enlargement Borderline right axis deviation Nonspecific repol abnormality, diffuse leads Since last tracing rate slower Confirmed by Winfred Leeds  MD, SAM [phone]) on 11/15/2015 5:26:00 PM       Results for orders placed or performed during the hospital encounter of [id number]  Basic metabolic panel  Result Value Ref Range   Sodium 136 135 - 145 mmol/L   Potassium 6.0 (H) 3.5 - 5.1 mmol/L   Chloride 112 (H) 101 - 111 mmol/L   CO2 17 (L) 22 - 32 mmol/L   Glucose, Bld 133 (H) 65 - 99 mg/dL   BUN 50 (H) 6 - 20 mg/dL   Creatinine, Ser 2.35 (H) 0.61 - 1.24 mg/dL   Calcium 8.8 (L) 8.9 - 10.3 mg/dL   GFR calc non Af Amer 24 (L) >60 mL/min   GFR calc Af Amer 28 (L) >60 mL/min   Anion gap 7 5 - 15  Urinalysis, Routine w reflex microscopic  Result Value Ref Range   Color, Urine YELLOW YELLOW   APPearance CLEAR CLEAR   Specific Gravity, Urine 1.020 1.005 - 1.030   pH 5.0 5.0 - 8.0   Glucose, UA NEGATIVE NEGATIVE mg/dL   Hgb urine dipstick NEGATIVE NEGATIVE   Bilirubin Urine NEGATIVE NEGATIVE   Ketones, ur NEGATIVE NEGATIVE mg/dL   Protein, ur NEGATIVE NEGATIVE mg/dL   Nitrite NEGATIVE NEGATIVE   Leukocytes, UA NEGATIVE NEGATIVE  Protime-INR  Result Value Ref Range   Prothrombin Time 15.6 (H) 11.4 - 15.2 seconds   INR 1.23   APTT  Result Value Ref Range   aPTT 28 24 - 36 seconds  Urine rapid drug screen (hosp performed)not at [facility]  Result Value Ref Range   Opiates POSITIVE (A) NONE DETECTED   Cocaine NONE DETECTED NONE DETECTED   Benzodiazepines POSITIVE (A) NONE DETECTED   Amphetamines NONE DETECTED NONE DETECTED   Tetrahydrocannabinol NONE DETECTED NONE DETECTED   Barbiturates NONE DETECTED NONE DETECTED  I-stat troponin, ED (not at [facility])  Result Value Ref Range   Troponin i, poc 0.01 0.00 - 0.08 ng/mL   Comment 3            Mr Brain 69 Contrast Mr Angiogram Neck Wo Contrast Mr Angiogram Head Wo Contrast  Result Date: 11/15/2015 CLINICAL DATA:  82 y/o M; 1 day history of visual changes to the left eye and dizziness. EXAM: MRI HEAD WITHOUT CONTRAST MRA HEAD WITHOUT CONTRAST MRA NECK WITHOUT CONTRAST TECHNIQUE: Multiplanar, multiecho pulse sequences of the brain and surrounding structures were obtained without intravenous contrast. Angiographic images of the Circle of Willis were obtained using MRA technique without intravenous contrast. Angiographic images of the neck were obtained using MRA technique without intravenous contrast. Carotid stenosis measurements (when applicable) are obtained utilizing NASCET criteria, using the distal internal carotid diameter as the denominator. COMPARISON:  None. FINDINGS: MRI HEAD FINDINGS Brain: Chronic infarcts  within the right posterior lateral frontal lobe, right high parietal lobe, and right posterior temporal parietal lobe with some extension into occipital lobe. Background of mild to moderate parenchymal volume loss and chronic microvascular ischemic changes. No diffusion restriction to suggest acute/early subacute infarct. There is hemosiderin staining of the large right posterior temporal/parietal infarct and punctate foci of susceptibility in the right cerebellar hemisphere and left frontal lobe compatible with hemosiderin deposition. No evidence for acute intracranial hemorrhage or focal mass effect. Extra-axial space: No hydrocephalus. No midline shift. No effacement of basilar cisterns. No extra-axial collection is identified. Low-lying cerebellar tonsils 8 mm below the foramen magnum. Other: Mild mucosal thickening of the paranasal sinuses. No abnormal signal of the mastoid air cells. Orbits are unremarkable. Calvarium is unremarkable. MRA HEAD FINDINGS Internal carotid arteries: Patent. Diminished flow related signal within the proximal left cavernous segment probably reflects underlying stenosis. The carotid siphons bilaterally are irregular in contour consistent with atherosclerotic changes. Anterior cerebral arteries:  Patent. Middle cerebral arteries: Patent. Anterior communicating artery: Patent. Irregularity of the left A1 segment with moderate stenosis. Posterior communicating arteries:  Patent. Posterior cerebral arteries: Patent. Short segment of mild stenosis of the left P1 segment. Basilar artery:  Patent. Vertebral arteries:  Patent.  Left dominant vertebrobasilar system. No evidence of high-grade stenosis, large vessel occlusion, or aneurysm unless noted above. MRA NECK FINDINGS Right common carotid artery: Patent. Right internal carotid artery: Plaque of the bifurcation and proximal vessel with moderate 50% stenosis. Right vertebral artery: Patent. Left common carotid artery: Patent. Left  Internal carotid artery: Patent.  No significant stenosis. Left Vertebral artery: Tortuous and irregular throughout the entirety of the V2 segment which may be due to atherosclerosis or sequelae of fibromuscular dysplasia. IMPRESSION: 1. No evidence of acute/early subacute infarct, intracranial hemorrhage, focal mass effect, or hydrocephalus. 2. Several chronic infarcts in the right frontal lobe, right parietal lobe, and right temporoparietal junction. 3. Mild chronic microvascular ischemic changes and mild to moderate parenchymal volume loss. 4. No large vessel occlusion, high-grade stenosis, or aneurysm of the circle of Willis. Areas of vessel irregularity and mild stenosis consistent with intracranial atherosclerosis. 5. Moderate 50% stenosis of the proximal right internal carotid artery. No significant stenosis of the left carotid artery. 6. Patent vertebral arteries. Tortuous and irregular left vertebral artery probably due to atherosclerosis, possibly sequelae of fibromuscular dysplasia. Electronically Signed   By: Kristine Garbe M.D.   On: 11/15/2015 22:46     Antonietta Breach, PA-C 11/15/15 [place], MD 11/15/15 [phone]

## 2015-11-15 NOTE — ED Provider Notes (Signed)
Inflamed of dizziness i.e. feeling of off balance onset today and he had blurred vision of his left eye onset yesterday visual changes have since resolved. Feeling of the off balance has improved however not resolved yet. No other associated symptoms. Patient has chronic speech impediment secondary to cleft palate. On exam alert nontoxic Glasgow Coma Score 15 HEENT exam no facial asymmetry neck supple no bruits heart regular rate and rhythm lungs clear auscultation neurologic Glasgow Coma Score 15 cranial nerves II through XII grossly intact finger to nose normal motor strength 5 over 5 overall DTRs symmetric bilaterally at knee jerk ankle jerk and biceps toes downward going bilaterally   Christopher Dakin, MD 11/15/15 1705

## 2015-11-15 NOTE — ED Provider Notes (Signed)
[facility] DEPT Provider Note   CSN: [id number] Arrival date & time: 11/15/15  1514     History   Chief Complaint Chief Complaint  Patient presents with  . Dizziness    HPI KOLLYN KOVANDA is a 82 y.o. male.  HPI Patient presents to the emergency department with a one-day history of visual change in the left eye and then today noticed that he was dizzy and states that he describes that as being unsteady in his gait, that started around 1:30.  The patient states that he does not have any room spinning and does not feel like he is going to pass out.  The patient states that he never had similar symptoms.  Patient states that nothing seems to make the condition better or worse.The patient denies chest pain, shortness of breath, headache,blurred vision, neck pain, fever, cough, weakness, numbness anorexia, edema, abdominal pain, nausea, vomiting, diarrhea, rash, back pain, dysuria, hematemesis, bloody stool, near syncope, or syncope. Past Medical History:  Diagnosis Date  . Anemia   . CHF (congestive heart failure) ([place]) 05/2013  . Diabetes mellitus, type 2 ([place])   . Flu Feb. 9, 2015   ?  Mild Heart Attack  . GERD (gastroesophageal reflux disease)   . H/O thoracic aortic aneurysm repair    Stent Graft (penetrating ulcer) - r/t staphylococcal infection  . High cholesterol   . History of tobacco use    50 pack year history, quit in 2002  . Hypertension   . Left carotid artery occlusion    moderate R carotid disease; L Common Carotid Bypassed during Arch Repair.  Marland Kitchen MRSA infection    s/p '02-no problems now  . Myocardial infarction [facility]) Feb. 9, 2015   Mild Heart attack  . Osteoporosis, senile   . PAD (peripheral artery disease) ([facility])    s/p L Fem-Pop Bypass; R Iliac PTA  . Seasonal allergies   . Stroke ([place])   . Transfusion history    '02- back surgery    Patient Active Problem List   Diagnosis Date Noted  . Bilateral carotid artery occlusion 10/29/2015  . Peripheral  vascular disease, unspecified ([place]) 08/18/2013  . Chronic systolic heart failure ([place]) 07/01/2013  . Pulmonary edema 06/13/2013  . Acute decompensated heart failure ([place]) 06/13/2013  . Thrombocytopenia ([place]) 06/13/2013  . Influenza A with respiratory manifestations 05/10/2013  . Acute respiratory failure with hypoxia ([place]) 05/10/2013  . Dehydration 05/10/2013  . NSTEMI, initial episode of care ([place]) 05/09/2013  . Fever of undetermined origin 05/09/2013  . Productive cough 05/09/2013  . Acute renal failure ([place]) 05/09/2013  . NSTEMI (non-ST elevated myocardial infarction) ([place]) 05/09/2013  . Pulmonary infiltrate in right lung on chest x-ray 05/09/2013  . History of right PCA stroke - right occipital 05/09/2013  . Hypertension   . High cholesterol   . PAD (peripheral artery disease) ([place])   . Diabetes mellitus, type 2 ([place])   . Infected sebaceous cyst 04/07/2013  . Occlusion and stenosis of carotid artery without mention of cerebral infarction 10/23/2011    Past Surgical History:  Procedure Laterality Date  . 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 BACK SURGERY  2001  . CLEFT LIP REPAIR    . COLONOSCOPY WITH PROPOFOL N/A 08/10/2012   Procedure: COLONOSCOPY WITH PROPOFOL;  Surgeon: Garlan Fair,  MD;  Location: [facility] ENDOSCOPY;  Service: Endoscopy;  Laterality: N/A;  . ESOPHAGOGASTRODUODENOSCOPY (EGD) WITH PROPOFOL N/A 08/10/2012   Procedure: ESOPHAGOGASTRODUODENOSCOPY (EGD) WITH PROPOFOL;  Surgeon: Garlan Fair, MD;  Location: [facility] ENDOSCOPY;  Service: Endoscopy;  Laterality: N/A;  . HEMIARTHROPLASTY HIP Right   . ILIAC ARTERY STENT Right    PTA -STENT  . JOINT REPLACEMENT Right 2003   right hip   . PR VEIN BYPASS GRAFT,AORTO-FEM-POP  1999  . THORACIC AORTIC ENDOVASCULAR STENT GRAFT  2003   @ [facility]  - for Penetrating  Ulcer/Aneurysm (cardiac cath done 01/08/2001 by Dr. Antionette Poles)  . TRANSTHORACIC ECHOCARDIOGRAM  10/2009   EF=>55%, LA mildly dilated, mild mitral annular calfication, mild MR, mild TR, RVSP 30-3mmHg, AV mildly sclerotic, trace pulm valve regurg  . VASCULAR SURGERY         Home Medications    Prior to Admission medications   Medication Sig Start Date End Date Taking? Authorizing Provider  aspirin EC 81 MG tablet Take 81 mg by mouth every morning.    Yes Historical Provider, MD  atorvastatin (LIPITOR) 40 MG tablet Take 20 mg by mouth every evening.    Yes Historical Provider, MD  CARTIA XT 240 MG 24 hr capsule Take 240 mg by mouth every morning. 09/02/15  Yes Historical Provider, MD  carvedilol (COREG) 6.25 MG tablet Take 6.25 mg by mouth 2 (two) times daily with a meal.   Yes Historical Provider, MD  Cholecalciferol (VITAMIN D) 2000 UNITS tablet Take 2,000 Units by mouth daily.   Yes Historical Provider, MD  diphenhydrAMINE (BENADRYL) 25 MG tablet Take 25 mg by mouth at bedtime.   Yes Historical Provider, MD  doxazosin (CARDURA) 2 MG tablet Take 2 mg by mouth daily.  06/04/13  Yes Historical Provider, MD  Ferrous Gluconate (IRON) 246 (28 FE) MG TABS Take 2 tablets by mouth daily.   Yes Historical Provider, MD  fluticasone (FLONASE) 50 MCG/ACT nasal spray Place 2 sprays into the nose daily.   Yes Historical Provider, MD  furosemide (LASIX) 20 MG tablet Take 20 mg by mouth every other day. Take 1 tab every other day depending on weight gain 02/22/15  Yes Historical Provider, MD  glimepiride (AMARYL) 2 MG tablet Take 0.25 mg by mouth daily as needed (for blood sugar over 135 (1/8 of a 2 mg tablet)).    Yes Historical Provider, MD  HYDROcodone-acetaminophen (NORCO/VICODIN) 5-325 MG per tablet Take 1 tablet by mouth every 6 (six) hours as needed for pain.    Yes Historical Provider, MD  isosorbide dinitrate (ISORDIL) 10 MG tablet Take 1 tablet (10 mg total) by mouth 3 (three) times daily. 05/16/13  Yes  Erline Hau, MD  loratadine (CLARITIN) 10 MG tablet Take 10 mg by mouth daily.   Yes Historical Provider, MD  Multiple Vitamin (MULTIVITAMIN) capsule Take 1 capsule by mouth daily.   Yes Historical Provider, MD  niacin (SLO-NIACIN) 500 MG tablet Take 500 mg by mouth daily.    Yes Historical Provider, MD  Omega-3 Fatty Acids (FISH OIL) 1200 MG CAPS Take 4,800 mg by mouth every morning.    Yes Historical Provider, MD  omeprazole (PRILOSEC) 20 MG capsule Take 20 mg by mouth daily.   Yes Historical Provider, MD  ONE TOUCH ULTRA TEST test strip 1 strip by Other route daily. Use 1 strip to check glucose in the mornings daily 04/03/15  Yes Historical Provider, MD  polyethylene glycol (MIRALAX / GLYCOLAX) packet Take 17 g  by mouth daily.   Yes Historical Provider, MD  PREVIDENT 5000 BOOSTER PLUS 1.1 % PSTE Use once in place of toothpaste brush 2 minutes then spit. DO NOT RINSE 04/03/15  Yes Historical Provider, MD  temazepam (RESTORIL) 30 MG capsule Take 30 mg by mouth at bedtime as needed for sleep. For sleep   Yes Historical Provider, MD  amoxicillin (AMOXIL) 500 MG capsule Take 2,000 mg by mouth as needed (1 hour before dental appointments).  03/02/15   Historical Provider, MD    Family History Family History  Problem Relation Age of Onset  . Diabetes Mother   . Hypertension Mother     heart disese, died of MI at 86  . Hyperlipidemia Mother   . Heart disease Father     CHF  . Heart attack Father   . Arthritis Father   . Diabetes Sister     also Alzheimers  . Heart disease Son     Heart Disease before age 3  . Marfan syndrome Daughter   . AAA (abdominal aortic aneurysm) Son     repair in 79s    Social History Social History  Substance Use Topics  . Smoking status: Former Smoker    Quit date: 09/24/2000  . Smokeless tobacco: Never Used  . Alcohol use No     Allergies   Codeine   Review of Systems Review of Systems All other systems negative except as documented in  the HPI. All pertinent positives and negatives as reviewed in the HPI.  Physical Exam Updated Vital Signs BP 120/69   Pulse 69   Temp 98.2 F (36.8 C) (Oral)   Resp 16   Ht 5\' 4"  (1.626 m)   Wt 67.1 kg   SpO2 99%   BMI 25.40 kg/m   Physical Exam  Constitutional: He is oriented to person, place, and time. He appears well-developed and well-nourished. No distress.  HENT:  Head: Normocephalic and atraumatic.  Mouth/Throat: Oropharynx is clear and moist.  Eyes: Pupils are equal, round, and reactive to light.  Neck: Normal range of motion. Neck supple.  Cardiovascular: Normal rate, regular rhythm and normal heart sounds.  Exam reveals no gallop and no friction rub.   No murmur heard. Pulmonary/Chest: Effort normal and breath sounds normal. No respiratory distress. He has no wheezes.  Abdominal: Soft. Bowel sounds are normal. He exhibits no distension. There is no tenderness.  Neurological: He is alert and oriented to person, place, and time. He has normal strength. No sensory deficit. He exhibits normal muscle tone. Coordination normal. GCS eye subscore is 4. GCS verbal subscore is 5. GCS motor subscore is 6.  Patient did seem somewhat unsteady in his gait, but no severe gait disturbance  Skin: Skin is warm and dry. No rash noted. No erythema.  Psychiatric: He has a normal mood and affect. His behavior is normal.  Nursing note and vitals reviewed.    ED Treatments / Results  Labs (all labs ordered are listed, but only abnormal results are displayed) Labs Reviewed  BASIC METABOLIC PANEL - Abnormal; Notable for the following:       Result Value   Potassium 6.0 (*)    Chloride 112 (*)    CO2 17 (*)    Glucose, Bld 133 (*)    BUN 50 (*)    Creatinine, Ser 2.35 (*)    Calcium 8.8 (*)    GFR calc non Af Amer 24 (*)    GFR calc Af Amer 28 (*)  All other components within normal limits  PROTIME-INR - Abnormal; Notable for the following:    Prothrombin Time 15.6 (*)    All  other components within normal limits  URINE RAPID DRUG SCREEN, HOSP PERFORMED - Abnormal; Notable for the following:    Opiates POSITIVE (*)    Benzodiazepines POSITIVE (*)    All other components within normal limits  URINALYSIS, ROUTINE W REFLEX MICROSCOPIC (NOT AT [facility])  APTT  ETHANOL  I-STAT TROPOININ, ED    EKG  EKG Interpretation  Date/Time:  Thursday November 15 2015 15:36:32 EDT Ventricular Rate:  72 PR Interval:    QRS Duration: 96 QT Interval:  352 QTC Calculation: 386 R Axis:   85 Text Interpretation:  Sinus rhythm Left atrial enlargement Borderline right axis deviation Nonspecific repol abnormality, diffuse leads Since last tracing rate slower Confirmed by Winfred Leeds  MD, SAM ([id number]) on 11/15/2015 5:26:00 PM       Radiology No results found.  Procedures Procedures (including critical care time)  Medications Ordered in ED Medications - No data to display   Initial Impression / Assessment and Plan / ED Course  I have reviewed the triage vital signs and the nursing notes.  Pertinent labs & imaging results that were available during my care of the patient were reviewed by me and considered in my medical decision making (see chart for details).  Clinical Course    The patient will be sent for MRI spoke with neurology who advised that this is the best course of action and to call them once the results are back.  Patient is otherwise stable  Final Clinical Impressions(s) / ED Diagnoses   Final diagnoses:  None    New Prescriptions New Prescriptions   No medications on file     Dalia Heading, PA-C 11/15/15 1936    Orlie Dakin, MD 11/16/15 0000

## 2015-11-15 NOTE — ED Notes (Signed)
Pt returned from MRI °

## 2015-11-16 DIAGNOSIS — E875 Hyperkalemia: Secondary | ICD-10-CM

## 2015-11-16 DIAGNOSIS — H538 Other visual disturbances: Secondary | ICD-10-CM | POA: Diagnosis present

## 2015-11-16 DIAGNOSIS — R42 Dizziness and giddiness: Secondary | ICD-10-CM

## 2015-11-16 DIAGNOSIS — N179 Acute kidney failure, unspecified: Secondary | ICD-10-CM | POA: Diagnosis not present

## 2015-11-16 DIAGNOSIS — I5042 Chronic combined systolic (congestive) and diastolic (congestive) heart failure: Secondary | ICD-10-CM

## 2015-11-16 DIAGNOSIS — E7800 Pure hypercholesterolemia, unspecified: Secondary | ICD-10-CM

## 2015-11-16 DIAGNOSIS — I10 Essential (primary) hypertension: Secondary | ICD-10-CM

## 2015-11-16 LAB — BASIC METABOLIC PANEL
Anion gap: 6 (ref 5–15)
Anion gap: 10 (ref 5–15)
BUN: 39 mg/dL — ABNORMAL HIGH (ref 6–20)
BUN: 37 mg/dL — ABNORMAL HIGH (ref 6–20)
CALCIUM: 9.2 mg/dL (ref 8.9–10.3)
CALCIUM: 9.5 mg/dL (ref 8.9–10.3)
CHLORIDE: 111 mmol/L (ref 101–111)
CHLORIDE: 108 mmol/L (ref 101–111)
CO2: 20 mmol/L — AB (ref 22–32)
CO2: 21 mmol/L — AB (ref 22–32)
CREATININE: 1.94 mg/dL — AB (ref 0.61–1.24)
CREATININE: 1.75 mg/dL — AB (ref 0.61–1.24)
GFR calc Af Amer: 35 mL/min — ABNORMAL LOW (ref >60)
GFR calc non Af Amer: 30 mL/min — ABNORMAL LOW (ref >60)
GFR calc non Af Amer: 35 mL/min — ABNORMAL LOW (ref >60)
GFR, EST AFRICAN AMERICAN: 40 mL/min — AB (ref >60)
GLUCOSE: 172 mg/dL — AB (ref 65–99)
GLUCOSE: 153 mg/dL — AB (ref 65–99)
Potassium: 4.9 mmol/L (ref 3.5–5.1)
Potassium: 5 mmol/L (ref 3.5–5.1)
Sodium: 137 mmol/L (ref 135–145)
Sodium: 139 mmol/L (ref 135–145)

## 2015-11-16 LAB — CBC
HCT: 33.8 % — ABNORMAL LOW (ref 39.0–52.0)
HEMOGLOBIN: 11.4 g/dL — AB (ref 13.0–17.0)
MCH: 32.3 pg (ref 26.0–34.0)
MCHC: 33.7 g/dL (ref 30.0–36.0)
MCV: 95.8 fL (ref 78.0–100.0)
PLATELETS: 111 10*3/uL — AB (ref 150–400)
RBC: 3.53 MIL/uL — AB (ref 4.22–5.81)
RDW: 11.9 % (ref 11.5–15.5)
WBC: 6.8 10*3/uL (ref 4.0–10.5)

## 2015-11-16 LAB — GLUCOSE, CAPILLARY
GLUCOSE-CAPILLARY: 173 mg/dL — AB (ref 65–99)
GLUCOSE-CAPILLARY: 168 mg/dL — AB (ref 65–99)
GLUCOSE-CAPILLARY: 170 mg/dL — AB (ref 65–99)
Glucose-Capillary: 142 mg/dL — ABNORMAL HIGH (ref 65–99)
Glucose-Capillary: 181 mg/dL — ABNORMAL HIGH (ref 65–99)

## 2015-11-16 MED ORDER — DOCUSATE SODIUM 100 MG PO CAPS
100.0000 mg | ORAL_CAPSULE | Freq: Two times a day (BID) | ORAL | Status: DC
  Administered 2015-11-16 – 2015-11-17 (×3): 100 mg via ORAL
  Filled 2015-11-16 (×3): qty 1

## 2015-11-16 MED ORDER — ACETAMINOPHEN 325 MG PO TABS
650.0000 mg | ORAL_TABLET | Freq: Four times a day (QID) | ORAL | Status: DC | PRN
Start: 2015-11-16 — End: 2015-11-17

## 2015-11-16 MED ORDER — ATORVASTATIN CALCIUM 20 MG PO TABS
20.0000 mg | ORAL_TABLET | Freq: Every evening | ORAL | Status: DC
  Administered 2015-11-16: 20 mg via ORAL
  Filled 2015-11-16: qty 1

## 2015-11-16 MED ORDER — ONDANSETRON HCL 4 MG/2ML IJ SOLN: 4.0000 mg | Freq: Four times a day (QID) | INTRAMUSCULAR | Status: DC | PRN

## 2015-11-16 MED ORDER — POLYETHYLENE GLYCOL 3350 17 G PO PACK
17.0000 g | PACK | Freq: Every day | ORAL | Status: DC
  Administered 2015-11-16 – 2015-11-17 (×2): 17 g via ORAL
  Filled 2015-11-16 (×2): qty 1

## 2015-11-16 MED ORDER — INSULIN ASPART 100 UNIT/ML ~~LOC~~ SOLN
0.0000 [IU] | Freq: Three times a day (TID) | SUBCUTANEOUS | Status: DC
  Administered 2015-11-16 (×2): 2 [IU] via SUBCUTANEOUS
  Administered 2015-11-16: 1 [IU] via SUBCUTANEOUS
  Administered 2015-11-17: 2 [IU] via SUBCUTANEOUS
  Administered 2015-11-17: 1 [IU] via SUBCUTANEOUS

## 2015-11-16 MED ORDER — DILTIAZEM HCL ER COATED BEADS 120 MG PO CP24
240.0000 mg | ORAL_CAPSULE | Freq: Every day | ORAL | Status: DC
  Administered 2015-11-16 – 2015-11-17 (×2): 240 mg via ORAL
  Filled 2015-11-16 (×2): qty 2

## 2015-11-16 MED ORDER — PANTOPRAZOLE SODIUM 40 MG PO TBEC
40.0000 mg | DELAYED_RELEASE_TABLET | Freq: Every day | ORAL | Status: DC
  Administered 2015-11-16 – 2015-11-17 (×2): 40 mg via ORAL
  Filled 2015-11-16 (×2): qty 1

## 2015-11-16 MED ORDER — ENOXAPARIN SODIUM 30 MG/0.3ML ~~LOC~~ SOLN
30.0000 mg | SUBCUTANEOUS | Status: DC
  Administered 2015-11-16 – 2015-11-17 (×2): 30 mg via SUBCUTANEOUS
  Filled 2015-11-16 (×2): qty 0.3

## 2015-11-16 MED ORDER — SODIUM CHLORIDE 0.9% FLUSH
3.0000 mL | Freq: Two times a day (BID) | INTRAVENOUS | Status: DC
Start: 2015-11-16 — End: 2015-11-17
  Administered 2015-11-17: 3 mL via INTRAVENOUS

## 2015-11-16 MED ORDER — ACETAMINOPHEN 650 MG RE SUPP
650.0000 mg | Freq: Four times a day (QID) | RECTAL | Status: DC | PRN
Start: 2015-11-16 — End: 2015-11-17

## 2015-11-16 MED ORDER — ONDANSETRON HCL 4 MG PO TABS: 4.0000 mg | ORAL_TABLET | Freq: Four times a day (QID) | ORAL | Status: DC | PRN

## 2015-11-16 MED ORDER — CARVEDILOL 6.25 MG PO TABS
6.2500 mg | ORAL_TABLET | Freq: Two times a day (BID) | ORAL | Status: DC
  Administered 2015-11-16 – 2015-11-17 (×3): 6.25 mg via ORAL
  Filled 2015-11-16 (×3): qty 1

## 2015-11-16 MED ORDER — ASPIRIN EC 81 MG PO TBEC
81.0000 mg | DELAYED_RELEASE_TABLET | Freq: Every morning | ORAL | Status: DC
  Administered 2015-11-16 – 2015-11-17 (×2): 81 mg via ORAL
  Filled 2015-11-16 (×2): qty 1

## 2015-11-16 MED ORDER — ISOSORBIDE DINITRATE 10 MG PO TABS
10.0000 mg | ORAL_TABLET | Freq: Three times a day (TID) | ORAL | Status: DC
  Administered 2015-11-16 – 2015-11-17 (×4): 10 mg via ORAL
  Filled 2015-11-16 (×4): qty 1

## 2015-11-16 MED ORDER — LACTATED RINGERS IV SOLN
INTRAVENOUS | Status: DC
  Administered 2015-11-16 (×2): via INTRAVENOUS

## 2015-11-16 NOTE — Care Management Obs Status (Signed)
[facility] NOTIFICATION   Patient Details  Name: JEOVANY MEAVE MRN: [id number] Date of Birth: 1930-05-18   Medicare Observation Status Notification Given:  Yes    Pranav Lince, Rory Percy, RN 11/16/2015, 4:57 PM

## 2015-11-16 NOTE — ED Notes (Signed)
Attempted report.  Nurse to call back when available. 

## 2015-11-16 NOTE — Progress Notes (Signed)
New Admission Note:  Arrival Method: Stretcher Mental Orientation: A&ox4 Telemetry: Box 10, CCMD notified. Assessment: Completed Skin: Intact, completed with Mady Gemma, RN. IV: Clean, dry, and intact Pain: 0/10 Safety Measures: Safety Fall Prevention Plan discussed with patient Admission: Completed [address] Orientation: Patient has been orientated to the room, unit and the staff. Orders have been reviewed and implemented. Will continue to monitor the patient. Call light has been placed within reach.  Nena Polio BSN, RN  Phone Number: [phone]

## 2015-11-16 NOTE — Evaluation (Signed)
Physical Therapy Evaluation Patient Details Name: RC SCHIRALDI MRN: [id number] DOB: 06/22/1930 Today's Date: 11/16/2015   History of Present Illness  Pt is an 82 y/o male admitted for c/o dizziness and blurred vision. PMH including but not limited to CHF, HTN, CVA, DM, PAD, hx of MI in 2015, R THA in 2003.  Clinical Impression  Pt presented supine in bed with HOB elevated, awake and willing to participate in therapy session. Prior to admission, pt stated that he was independent with ADLs and functional mobility with RW. Pt performed bed mobility, transfers and ambulated with min guard for safety only, no physical assistance required. Pt able to sit EOB for therapist to perform MMT. Pt with 4/5 for hip flexion, hip abduction, hip adduction, knee flexion, knee extension, and ankle DF bilaterally. Pt stated that he felt he is back to his baseline level of function.      Follow Up Recommendations Supervision - Intermittent    Equipment Recommendations  None recommended by PT;Other (comment) (pt reported having all necessary equipment at home)    Recommendations for Other Services       Precautions / Restrictions Precautions Precautions: None Restrictions Weight Bearing Restrictions: No      Mobility  Bed Mobility Overal bed mobility: Needs Assistance Bed Mobility: Supine to Sit;Sit to Supine     Supine to sit: Supervision;HOB elevated Sit to supine: Supervision   General bed mobility comments: pt required increased time  Transfers Overall transfer level: Needs assistance Equipment used: Rolling walker (2 wheeled) Transfers: Sit to/from Stand Sit to Stand: Min guard         General transfer comment: pt required increased time  Ambulation/Gait Ambulation/Gait assistance: Min guard Ambulation Distance (Feet): 100 Feet Assistive device: Rolling walker (2 wheeled) Gait Pattern/deviations: Step-through pattern;Decreased step length - left;Decreased step length -  right;Decreased stride length;Shuffle Gait velocity: decreased   General Gait Details: pt required VC'ing for safety to stay within base of RW  Stairs            Wheelchair Mobility    Modified Rankin (Stroke Patients Only)       Balance Overall balance assessment: Needs assistance Sitting-balance support: Feet unsupported;No upper extremity supported Sitting balance-Leahy Scale: Fair     Standing balance support: During functional activity;Bilateral upper extremity supported Standing balance-Leahy Scale: Poor                               Pertinent Vitals/Pain Pain Assessment: No/denies pain    Home Living Family/patient expects to be discharged to:: Private residence Living Arrangements: Children Available Help at Discharge: Family;Available 24 hours/day Type of Home: House Home Access: Level entry     Home Layout: One level Home Equipment: Walker - 2 wheels;Cane - single point;Shower seat      Prior Function Level of Independence: Independent with assistive device(s)         Comments: pt uses RW to ambulate     Hand Dominance        Extremity/Trunk Assessment   Upper Extremity Assessment: Overall WFL for tasks assessed           Lower Extremity Assessment: Overall WFL for tasks assessed      Cervical / Trunk Assessment: Kyphotic  Communication   Communication: No difficulties  Cognition Arousal/Alertness: Awake/alert Behavior During Therapy: WFL for tasks assessed/performed Overall Cognitive Status: Within Functional Limits for tasks assessed  General Comments      Exercises        Assessment/Plan    PT Assessment Patent does not need any further PT services  PT Diagnosis Difficulty walking   PT Problem List    PT Treatment Interventions     PT Goals (Current goals can be found in the Care Plan section) Acute Rehab PT Goals Patient Stated Goal: return home    Frequency      Barriers to discharge        Co-evaluation               End of Session Equipment Utilized During Treatment: Gait belt Activity Tolerance: Patient tolerated treatment well Patient left: in bed;with call bell/phone within reach Nurse Communication: Mobility status    Functional Assessment Tool Used: clinical judgement Functional Limitation: Mobility: Walking and moving around Mobility: Walking and Moving Around Current Status [id number]): At least 1 percent but less than 20 percent impaired, limited or restricted Mobility: Walking and Moving Around Goal Status [phone]): 0 percent impaired, limited or restricted    Time: 1645-1705 PT Time Calculation (min) (ACUTE ONLY): 20 min   Charges:   PT Evaluation $PT Eval Moderate Complexity: 1 Procedure     PT G Codes:   PT G-Codes **NOT FOR INPATIENT CLASS** Functional Assessment Tool Used: clinical judgement Functional Limitation: Mobility: Walking and moving around Mobility: Walking and Moving Around Current Status [id number]): At least 1 percent but less than 20 percent impaired, limited or restricted Mobility: Walking and Moving Around Goal Status [phone]): 0 percent impaired, limited or restricted    [facility] 11/16/2015, 6:02 PM Sherie Don, [place], DPT [phone]

## 2015-11-16 NOTE — Progress Notes (Signed)
PROGRESS NOTE    KRISTOFOR [name]  [id number]  DOB: 09/26/1930  DOA: 11/15/2015 PCP: [name], MD Outpatient Specialists:  Hospital course: HPI: [name] is a 82 y.o. male with medical history significant of remote CVA, PAS, MI, DM, HLD, CHF, and AAA s/p repair.  Patient reports a lot of dizziness and left eye was really blurry yesterday and today.  Noticed eye blurriness yesterday, dizziness started today.  Symptoms resolved at this time.  No change in appetite.  No other changes.  Pt was noted to have hyperkalemia with acute on chronic renal failure. MRI brain negative for acute findings.  Placed in observation.   Assessment & Plan:   Hyperkalemia -Patient with acute kidney injury and subsequent hyperkalemia -Was treated with Lasix and IVF with resultant improvement -Hyperkalemia is now resolved  AKI -Baseline creatinine appears to be about 1.4 -Creatinine on presentation was 2.35 -Improving with IVF, can DC IVF now.  Eating and drinking well.    Blurred vision/dizziness -Initial concern was for CVA but this was deemed unlikely by ER, Neurology -Suspect that symptoms were related to patient's AKI -Symptoms are now resolved, no recurrence reported.   - PT/OT evaluation ordered.   HTN -Continue home medications - Coreg, Cartia -Hold Lasix for now -Hold Cardura - would likely resume at discharge  DM -takes Amaryl -Will hold oral medication and cover with SSI -Tight control is not essential in this patient at this time  HLD -Continue Lipitor, hold Niacin and fish oil  HFrEF -Recent Echo in 7/17 showed EF 45-50% and grade 2 diastolic dysfunction -Holding Lasix for now -On beta blacker but not ACE - uncertain why not -Given current AKI would not start, but would encourage addition at discharge or short-term f/u -Continue Imdur   DVT prophylaxis: Lovenox Code Status:  Full - confirmed with patient Family Communication: None present    Disposition Plan: Home tomorrow if stable Consults called: Neurology (by ER)  Subjective: Pt says he is feeling much better, no further dizziness or blurred vision.   Objective: Vitals:   11/16/15 0105 11/16/15 0145 11/16/15 0637 11/16/15 0941  BP:  112/74 122/74 110/66  Pulse:  99 94 93  Resp:  (!) 22 20 18   Temp: 98.3 F (36.8 C) 98.1 F (36.7 C) 98.7 F (37.1 C) 98.4 F (36.9 C)  TempSrc: Oral Oral Oral Oral  SpO2:  100% 96% 98%  Weight:  65 kg (143 lb 3.2 oz)    Height:  5\' 4"  (1.626 m)      Intake/Output Summary (Last 24 hours) at 11/16/15 1523 Last data filed at 11/16/15 1451  Gross per 24 hour  Intake          1726.67 ml  Output             2525 ml  Net          -798.33 ml   Filed Weights   11/15/15 1540 11/16/15 0145  Weight: 67.1 kg (148 lb) 65 kg (143 lb 3.2 oz)    Exam:    General: Appears calm and comfortable and is NAD, cooperative.   Eyes:  PERRL, EOMI, normal lids, iris  ENT:  grossly normal hearing, lips & tongue, mmm; s/p cleft palate repair remotely  Neck:  no LAD, masses or thyromegaly  Cardiovascular:  RRR, no m/r/g. No LE edema.   Respiratory:  CTA bilaterally, no w/r/r. Normal respiratory effort.  Abdomen:  soft, ntnd, NABS  Skin:  no rash or induration  seen on limited exam  Musculoskeletal:  grossly normal tone BUE/BLE, good ROM, no bony abnormality  Psychiatric: grossly normal mood and affect, speech fluent and appropriate, AOx3  Neurologic:  CN 2-12 grossly intact, moves all extremities in coordinated fashion, sensation intact   Data Reviewed: Basic Metabolic Panel:  Recent Labs Lab 11/15/15 1642 11/15/15 2255 11/16/15 0150 11/16/15 0457  NA 136  --  137 139  K 6.0* 4.9 4.9 5.0  CL 112*  --  111 108  CO2 17*  --  20* 21*  GLUCOSE 133*  --  172* 153*  BUN 50*  --  39* 37*  CREATININE 2.35*  --  1.94* 1.75*  CALCIUM 8.8*  --  9.2 9.5   Liver Function Tests: No results for input(s): AST, ALT, ALKPHOS, BILITOT,  PROT, ALBUMIN in the last 168 hours. No results for input(s): LIPASE, AMYLASE in the last 168 hours. No results for input(s): AMMONIA in the last 168 hours. CBC:  Recent Labs Lab 11/16/15 0457  WBC 6.8  HGB 11.4*  HCT 33.8*  MCV 95.8  PLT 111*   Cardiac Enzymes: No results for input(s): CKTOTAL, CKMB, CKMBINDEX, TROPONINI in the last 168 hours. CBG (last 3)   Recent Labs  11/16/15 0126 11/16/15 0758 11/16/15 1155  GLUCAP 173* 142* 168*   No results found for this or any previous visit (from the past 240 hour(s)).   Studies: Mr Angiogram Head Wo Contrast  Result Date: 11/15/2015 CLINICAL DATA:  82 y/o M; 1 day history of visual changes to the left eye and dizziness. EXAM: MRI HEAD WITHOUT CONTRAST MRA HEAD WITHOUT CONTRAST MRA NECK WITHOUT CONTRAST TECHNIQUE: Multiplanar, multiecho pulse sequences of the brain and surrounding structures were obtained without intravenous contrast. Angiographic images of the Circle of Willis were obtained using MRA technique without intravenous contrast. Angiographic images of the neck were obtained using MRA technique without intravenous contrast. Carotid stenosis measurements (when applicable) are obtained utilizing NASCET criteria, using the distal internal carotid diameter as the denominator. COMPARISON:  None. FINDINGS: MRI HEAD FINDINGS Brain: Chronic infarcts within the right posterior lateral frontal lobe, right high parietal lobe, and right posterior temporal parietal lobe with some extension into occipital lobe. Background of mild to moderate parenchymal volume loss and chronic microvascular ischemic changes. No diffusion restriction to suggest acute/early subacute infarct. There is hemosiderin staining of the large right posterior temporal/parietal infarct and punctate foci of susceptibility in the right cerebellar hemisphere and left frontal lobe compatible with hemosiderin deposition. No evidence for acute intracranial hemorrhage or focal mass  effect. Extra-axial space: No hydrocephalus. No midline shift. No effacement of basilar cisterns. No extra-axial collection is identified. Low-lying cerebellar tonsils 8 mm below the foramen magnum. Other: Mild mucosal thickening of the paranasal sinuses. No abnormal signal of the mastoid air cells. Orbits are unremarkable. Calvarium is unremarkable. MRA HEAD FINDINGS Internal carotid arteries: Patent. Diminished flow related signal within the proximal left cavernous segment probably reflects underlying stenosis. The carotid siphons bilaterally are irregular in contour consistent with atherosclerotic changes. Anterior cerebral arteries:  Patent. Middle cerebral arteries: Patent. Anterior communicating artery: Patent. Irregularity of the left A1 segment with moderate stenosis. Posterior communicating arteries:  Patent. Posterior cerebral arteries: Patent. Short segment of mild stenosis of the left P1 segment. Basilar artery:  Patent. Vertebral arteries:  Patent.  Left dominant vertebrobasilar system. No evidence of high-grade stenosis, large vessel occlusion, or aneurysm unless noted above. MRA NECK FINDINGS Right common carotid artery: Patent. Right internal carotid artery:  Plaque of the bifurcation and proximal vessel with moderate 50% stenosis. Right vertebral artery: Patent. Left common carotid artery: Patent. Left Internal carotid artery: Patent.  No significant stenosis. Left Vertebral artery: Tortuous and irregular throughout the entirety of the V2 segment which may be due to atherosclerosis or sequelae of fibromuscular dysplasia. IMPRESSION: 1. No evidence of acute/early subacute infarct, intracranial hemorrhage, focal mass effect, or hydrocephalus. 2. Several chronic infarcts in the right frontal lobe, right parietal lobe, and right temporoparietal junction. 3. Mild chronic microvascular ischemic changes and mild to moderate parenchymal volume loss. 4. No large vessel occlusion, high-grade stenosis, or  aneurysm of the circle of Willis. Areas of vessel irregularity and mild stenosis consistent with intracranial atherosclerosis. 5. Moderate 50% stenosis of the proximal right internal carotid artery. No significant stenosis of the left carotid artery. 6. Patent vertebral arteries. Tortuous and irregular left vertebral artery probably due to atherosclerosis, possibly sequelae of fibromuscular dysplasia. Electronically Signed   By: Kristine Garbe M.D.   On: 11/15/2015 22:46   Mr Angiogram Neck Wo Contrast  Result Date: 11/15/2015 CLINICAL DATA:  82 y/o M; 1 day history of visual changes to the left eye and dizziness. EXAM: MRI HEAD WITHOUT CONTRAST MRA HEAD WITHOUT CONTRAST MRA NECK WITHOUT CONTRAST TECHNIQUE: Multiplanar, multiecho pulse sequences of the brain and surrounding structures were obtained without intravenous contrast. Angiographic images of the Circle of Willis were obtained using MRA technique without intravenous contrast. Angiographic images of the neck were obtained using MRA technique without intravenous contrast. Carotid stenosis measurements (when applicable) are obtained utilizing NASCET criteria, using the distal internal carotid diameter as the denominator. COMPARISON:  None. FINDINGS: MRI HEAD FINDINGS Brain: Chronic infarcts within the right posterior lateral frontal lobe, right high parietal lobe, and right posterior temporal parietal lobe with some extension into occipital lobe. Background of mild to moderate parenchymal volume loss and chronic microvascular ischemic changes. No diffusion restriction to suggest acute/early subacute infarct. There is hemosiderin staining of the large right posterior temporal/parietal infarct and punctate foci of susceptibility in the right cerebellar hemisphere and left frontal lobe compatible with hemosiderin deposition. No evidence for acute intracranial hemorrhage or focal mass effect. Extra-axial space: No hydrocephalus. No midline shift. No  effacement of basilar cisterns. No extra-axial collection is identified. Low-lying cerebellar tonsils 8 mm below the foramen magnum. Other: Mild mucosal thickening of the paranasal sinuses. No abnormal signal of the mastoid air cells. Orbits are unremarkable. Calvarium is unremarkable. MRA HEAD FINDINGS Internal carotid arteries: Patent. Diminished flow related signal within the proximal left cavernous segment probably reflects underlying stenosis. The carotid siphons bilaterally are irregular in contour consistent with atherosclerotic changes. Anterior cerebral arteries:  Patent. Middle cerebral arteries: Patent. Anterior communicating artery: Patent. Irregularity of the left A1 segment with moderate stenosis. Posterior communicating arteries:  Patent. Posterior cerebral arteries: Patent. Short segment of mild stenosis of the left P1 segment. Basilar artery:  Patent. Vertebral arteries:  Patent.  Left dominant vertebrobasilar system. No evidence of high-grade stenosis, large vessel occlusion, or aneurysm unless noted above. MRA NECK FINDINGS Right common carotid artery: Patent. Right internal carotid artery: Plaque of the bifurcation and proximal vessel with moderate 50% stenosis. Right vertebral artery: Patent. Left common carotid artery: Patent. Left Internal carotid artery: Patent.  No significant stenosis. Left Vertebral artery: Tortuous and irregular throughout the entirety of the V2 segment which may be due to atherosclerosis or sequelae of fibromuscular dysplasia. IMPRESSION: 1. No evidence of acute/early subacute infarct, intracranial hemorrhage, focal mass effect,  or hydrocephalus. 2. Several chronic infarcts in the right frontal lobe, right parietal lobe, and right temporoparietal junction. 3. Mild chronic microvascular ischemic changes and mild to moderate parenchymal volume loss. 4. No large vessel occlusion, high-grade stenosis, or aneurysm of the circle of Willis. Areas of vessel irregularity and mild  stenosis consistent with intracranial atherosclerosis. 5. Moderate 50% stenosis of the proximal right internal carotid artery. No significant stenosis of the left carotid artery. 6. Patent vertebral arteries. Tortuous and irregular left vertebral artery probably due to atherosclerosis, possibly sequelae of fibromuscular dysplasia. Electronically Signed   By: Kristine Garbe M.D.   On: 11/15/2015 22:46   Mr Brain Wo Contrast  Result Date: 11/15/2015 CLINICAL DATA:  82 y/o M; 1 day history of visual changes to the left eye and dizziness. EXAM: MRI HEAD WITHOUT CONTRAST MRA HEAD WITHOUT CONTRAST MRA NECK WITHOUT CONTRAST TECHNIQUE: Multiplanar, multiecho pulse sequences of the brain and surrounding structures were obtained without intravenous contrast. Angiographic images of the Circle of Willis were obtained using MRA technique without intravenous contrast. Angiographic images of the neck were obtained using MRA technique without intravenous contrast. Carotid stenosis measurements (when applicable) are obtained utilizing NASCET criteria, using the distal internal carotid diameter as the denominator. COMPARISON:  None. FINDINGS: MRI HEAD FINDINGS Brain: Chronic infarcts within the right posterior lateral frontal lobe, right high parietal lobe, and right posterior temporal parietal lobe with some extension into occipital lobe. Background of mild to moderate parenchymal volume loss and chronic microvascular ischemic changes. No diffusion restriction to suggest acute/early subacute infarct. There is hemosiderin staining of the large right posterior temporal/parietal infarct and punctate foci of susceptibility in the right cerebellar hemisphere and left frontal lobe compatible with hemosiderin deposition. No evidence for acute intracranial hemorrhage or focal mass effect. Extra-axial space: No hydrocephalus. No midline shift. No effacement of basilar cisterns. No extra-axial collection is identified. Low-lying  cerebellar tonsils 8 mm below the foramen magnum. Other: Mild mucosal thickening of the paranasal sinuses. No abnormal signal of the mastoid air cells. Orbits are unremarkable. Calvarium is unremarkable. MRA HEAD FINDINGS Internal carotid arteries: Patent. Diminished flow related signal within the proximal left cavernous segment probably reflects underlying stenosis. The carotid siphons bilaterally are irregular in contour consistent with atherosclerotic changes. Anterior cerebral arteries:  Patent. Middle cerebral arteries: Patent. Anterior communicating artery: Patent. Irregularity of the left A1 segment with moderate stenosis. Posterior communicating arteries:  Patent. Posterior cerebral arteries: Patent. Short segment of mild stenosis of the left P1 segment. Basilar artery:  Patent. Vertebral arteries:  Patent.  Left dominant vertebrobasilar system. No evidence of high-grade stenosis, large vessel occlusion, or aneurysm unless noted above. MRA NECK FINDINGS Right common carotid artery: Patent. Right internal carotid artery: Plaque of the bifurcation and proximal vessel with moderate 50% stenosis. Right vertebral artery: Patent. Left common carotid artery: Patent. Left Internal carotid artery: Patent.  No significant stenosis. Left Vertebral artery: Tortuous and irregular throughout the entirety of the V2 segment which may be due to atherosclerosis or sequelae of fibromuscular dysplasia. IMPRESSION: 1. No evidence of acute/early subacute infarct, intracranial hemorrhage, focal mass effect, or hydrocephalus. 2. Several chronic infarcts in the right frontal lobe, right parietal lobe, and right temporoparietal junction. 3. Mild chronic microvascular ischemic changes and mild to moderate parenchymal volume loss. 4. No large vessel occlusion, high-grade stenosis, or aneurysm of the circle of Willis. Areas of vessel irregularity and mild stenosis consistent with intracranial atherosclerosis. 5. Moderate 50% stenosis of  the proximal right internal carotid  artery. No significant stenosis of the left carotid artery. 6. Patent vertebral arteries. Tortuous and irregular left vertebral artery probably due to atherosclerosis, possibly sequelae of fibromuscular dysplasia. Electronically Signed   By: Kristine Garbe M.D.   On: 11/15/2015 22:46     Scheduled Meds: . aspirin EC  81 mg Oral q morning - 10a  . atorvastatin  20 mg Oral QPM  . carvedilol  6.25 mg Oral BID WC  . diltiazem  240 mg Oral Daily  . docusate sodium  100 mg Oral BID  . enoxaparin (LOVENOX) injection  30 mg Subcutaneous Q24H  . insulin aspart  0-9 Units Subcutaneous TID WC  . isosorbide dinitrate  10 mg Oral TID  . pantoprazole  40 mg Oral Daily  . polyethylene glycol  17 g Oral Daily  . sodium chloride flush  3 mL Intravenous Q12H   Continuous Infusions: . lactated ringers 100 mL/hr at 11/16/15 1221   Principal Problem:   Hyperkalemia Active Problems:   Hypertension   High cholesterol   Diabetes mellitus, type 2 (HCC)   Acute renal failure (HCC)   Chronic combined systolic and diastolic congestive heart failure (HCC)   Blurred vision   Dizziness and giddiness  Time spent:   Irwin Brakeman, MD, FAAFP [facility] Pager [phone]  If 7PM-7AM, please contact night-coverage [email or web address] Password [email or web address] 11/16/2015, 3:23 PM    LOS: 0 days

## 2015-11-17 DIAGNOSIS — H538 Other visual disturbances: Secondary | ICD-10-CM | POA: Diagnosis not present

## 2015-11-17 DIAGNOSIS — E118 Type 2 diabetes mellitus with unspecified complications: Secondary | ICD-10-CM

## 2015-11-17 DIAGNOSIS — N179 Acute kidney failure, unspecified: Secondary | ICD-10-CM | POA: Diagnosis not present

## 2015-11-17 DIAGNOSIS — I5042 Chronic combined systolic (congestive) and diastolic (congestive) heart failure: Secondary | ICD-10-CM | POA: Diagnosis not present

## 2015-11-17 DIAGNOSIS — E875 Hyperkalemia: Secondary | ICD-10-CM | POA: Diagnosis not present

## 2015-11-17 LAB — GLUCOSE, CAPILLARY
GLUCOSE-CAPILLARY: 135 mg/dL — AB (ref 65–99)
Glucose-Capillary: 171 mg/dL — ABNORMAL HIGH (ref 65–99)

## 2015-11-17 LAB — BASIC METABOLIC PANEL
Anion gap: 6 (ref 5–15)
BUN: 32 mg/dL — AB (ref 6–20)
CHLORIDE: 107 mmol/L (ref 101–111)
CO2: 22 mmol/L (ref 22–32)
CREATININE: 1.32 mg/dL — AB (ref 0.61–1.24)
Calcium: 9.1 mg/dL (ref 8.9–10.3)
GFR calc non Af Amer: 49 mL/min — ABNORMAL LOW (ref >60)
GFR, EST AFRICAN AMERICAN: 56 mL/min — AB (ref >60)
Glucose, Bld: 148 mg/dL — ABNORMAL HIGH (ref 65–99)
POTASSIUM: 4.7 mmol/L (ref 3.5–5.1)
Sodium: 135 mmol/L (ref 135–145)

## 2015-11-17 NOTE — Discharge Summary (Signed)
Physician Discharge Summary  [name] [id number] DOB: 03-25-1931 DOA: 11/15/2015  PCP: [name], MD  Admit date: 11/15/2015 Discharge date: 11/17/2015  Admitted From: Home Disposition:  Home  Recommendations for Outpatient Follow-up:  1. Follow up with PCP in 5-7 days 2. Please obtain BMP/CBC on follow up with PCP  Discharge Condition: Stable  CODE STATUS: FULL  Brief/Interim Summary: HPI: [name] a 82 y.o.malewith medical history significant of remote CVA, PAS, MI, DM, HLD, CHF, and AAA s/p repair. Patient reports a lot of dizziness and left eye was really blurry yesterday and today. Noticed eye blurriness yesterday, dizziness started today. Symptoms resolved at this time. No change in appetite. No other changes.  Pt was noted to have hyperkalemia with acute on chronic renal failure. MRI brain negative for acute findings.  Placed in observation.   Assessment & Plan:   Hyperkalemia -Patient with acute kidney injury and subsequent hyperkalemia -Was treated with Lasix and IVF with resultant improvement -Hyperkalemia is now resolved  AKI -Baseline creatinine appears to be about 1.4 -Creatinine on presentation was 2.35 -Improving with IVF, can DC IVF now.  Eating and drinking well.    Blurred vision/dizziness -Initial concern was for CVA but this was deemed unlikely by ER, Neurology -Suspect that symptoms were related to patient's AKI -Symptoms are now resolved, no recurrence reported.   - PT/OT evaluation ordered. Supervision recommended at home. Son will assist with this.   - Followup Outpatient appointment with [facility] Neurology to establish care and follow up on this.  MRI/MRA no acute findings, results reviewed and discussed with patient who verbalized understanding.    HTN -Continue home medications - Coreg, Cartia -Hold Lasix for now -Hold Cardura -Resume at discharge  DM -Resume home treatment regimen and followup with  PCP  HLD -Continue Lipitor, hold Niacin and fish oil in hospital, resume at discharge  HFrEF -Recent Echo in 7/17 showed EF 45-50% and grade 2 diastolic dysfunction -Holding Lasix for now -On beta blacker but not ACE - uncertain why not -Given current AKI would not start, but would encourage addition at discharge or short-term f/u -Continue Imdur    DVT prophylaxis:Lovenox Code Status:Full - confirmed with patient Family Communication:None present Disposition Plan:Home  Consults called:Neurology (by ER)  Discharge Diagnoses:  Principal Problem:   Hyperkalemia Active Problems:   Hypertension   High cholesterol   Diabetes mellitus, type 2 (HCC)   Acute renal failure (HCC)   Chronic combined systolic and diastolic congestive heart failure (HCC)   Blurred vision   Dizziness and giddiness    Discharge Instructions  Discharge Instructions    Increase activity slowly    Complete by:  As directed       Medication List    TAKE these medications   amoxicillin 500 MG capsule Commonly known as:  AMOXIL Take 2,000 mg by mouth as needed (1 hour before dental appointments).   aspirin EC 81 MG tablet Take 81 mg by mouth every morning.   atorvastatin 40 MG tablet Commonly known as:  LIPITOR Take 20 mg by mouth every evening.   CARTIA XT 240 MG 24 hr capsule Generic drug:  diltiazem Take 240 mg by mouth every morning.   carvedilol 6.25 MG tablet Commonly known as:  COREG Take 6.25 mg by mouth 2 (two) times daily with a meal.   diphenhydrAMINE 25 MG tablet Commonly known as:  BENADRYL Take 25 mg by mouth at bedtime.   doxazosin 2 MG tablet Commonly known as:  CARDURA Take 2 mg by mouth daily.   Fish Oil 1200 MG Caps Take 4,800 mg by mouth every morning.   fluticasone 50 MCG/ACT nasal spray Commonly known as:  FLONASE Place 2 sprays into the nose daily.   furosemide 20 MG tablet Commonly known as:  LASIX Take 20 mg by mouth every other day. Take  1 tab every other day depending on weight gain   glimepiride 2 MG tablet Commonly known as:  AMARYL Take 0.25 mg by mouth daily as needed (for blood sugar over 135 (1/8 of a 2 mg tablet)).   HYDROcodone-acetaminophen 5-325 MG tablet Commonly known as:  NORCO/VICODIN Take 1 tablet by mouth every 6 (six) hours as needed for pain.   Iron 246 (28 Fe) MG Tabs Take 2 tablets by mouth daily.   isosorbide dinitrate 10 MG tablet Commonly known as:  ISORDIL Take 1 tablet (10 mg total) by mouth 3 (three) times daily.   loratadine 10 MG tablet Commonly known as:  CLARITIN Take 10 mg by mouth daily.   multivitamin capsule Take 1 capsule by mouth daily.   niacin 500 MG tablet Commonly known as:  SLO-NIACIN Take 500 mg by mouth daily.   omeprazole 20 MG capsule Commonly known as:  PRILOSEC Take 20 mg by mouth daily.   ONE TOUCH ULTRA TEST test strip Generic drug:  glucose blood 1 strip by Other route daily. Use 1 strip to check glucose in the mornings daily   polyethylene glycol packet Commonly known as:  MIRALAX / GLYCOLAX Take 17 g by mouth daily.   PREVIDENT 5000 BOOSTER PLUS 1.1 % Pste Generic drug:  Sodium Fluoride Use once in place of toothpaste brush 2 minutes then spit. DO NOT RINSE   temazepam 30 MG capsule Commonly known as:  RESTORIL Take 30 mg by mouth at bedtime as needed for sleep. For sleep   Vitamin D 2000 units tablet Take 2,000 Units by mouth daily.      Follow-up Information    [name],MARK A, MD. Schedule an appointment as soon as possible for a visit in 1 week(s).   Specialty:  Internal Medicine Why:  Hospital Follow Up Contact information: [address] [place] [address] [phone]        [place]. Schedule an appointment as soon as possible for a visit in 2 week(s).   Why:  Establish Care with neurologist. Hospital Follow Up.  Contact information: [address]     [address] [place]  [phone]         Allergies  Allergen Reactions  . Codeine Nausea And Vomiting    Procedures/Studies: Mr Angiogram Head Wo Contrast  Result Date: 11/15/2015 CLINICAL DATA:  82 y/o M; 1 day history of visual changes to the left eye and dizziness. EXAM: MRI HEAD WITHOUT CONTRAST MRA HEAD WITHOUT CONTRAST MRA NECK WITHOUT CONTRAST TECHNIQUE: Multiplanar, multiecho pulse sequences of the brain and surrounding structures were obtained without intravenous contrast. Angiographic images of the Circle of Willis were obtained using MRA technique without intravenous contrast. Angiographic images of the neck were obtained using MRA technique without intravenous contrast. Carotid stenosis measurements (when applicable) are obtained utilizing NASCET criteria, using the distal internal carotid diameter as the denominator. COMPARISON:  None. FINDINGS: MRI HEAD FINDINGS Brain: Chronic infarcts within the right posterior lateral frontal lobe, right high parietal lobe, and right posterior temporal parietal lobe with some extension into occipital lobe. Background of mild to moderate parenchymal volume loss and  chronic microvascular ischemic changes. No diffusion restriction to suggest acute/early subacute infarct. There is hemosiderin staining of the large right posterior temporal/parietal infarct and punctate foci of susceptibility in the right cerebellar hemisphere and left frontal lobe compatible with hemosiderin deposition. No evidence for acute intracranial hemorrhage or focal mass effect. Extra-axial space: No hydrocephalus. No midline shift. No effacement of basilar cisterns. No extra-axial collection is identified. Low-lying cerebellar tonsils 8 mm below the foramen magnum. Other: Mild mucosal thickening of the paranasal sinuses. No abnormal signal of the mastoid air cells. Orbits are unremarkable. Calvarium is unremarkable. MRA HEAD FINDINGS Internal carotid arteries: Patent. Diminished flow  related signal within the proximal left cavernous segment probably reflects underlying stenosis. The carotid siphons bilaterally are irregular in contour consistent with atherosclerotic changes. Anterior cerebral arteries:  Patent. Middle cerebral arteries: Patent. Anterior communicating artery: Patent. Irregularity of the left A1 segment with moderate stenosis. Posterior communicating arteries:  Patent. Posterior cerebral arteries: Patent. Short segment of mild stenosis of the left P1 segment. Basilar artery:  Patent. Vertebral arteries:  Patent.  Left dominant vertebrobasilar system. No evidence of high-grade stenosis, large vessel occlusion, or aneurysm unless noted above. MRA NECK FINDINGS Right common carotid artery: Patent. Right internal carotid artery: Plaque of the bifurcation and proximal vessel with moderate 50% stenosis. Right vertebral artery: Patent. Left common carotid artery: Patent. Left Internal carotid artery: Patent.  No significant stenosis. Left Vertebral artery: Tortuous and irregular throughout the entirety of the V2 segment which may be due to atherosclerosis or sequelae of fibromuscular dysplasia. IMPRESSION: 1. No evidence of acute/early subacute infarct, intracranial hemorrhage, focal mass effect, or hydrocephalus. 2. Several chronic infarcts in the right frontal lobe, right parietal lobe, and right temporoparietal junction. 3. Mild chronic microvascular ischemic changes and mild to moderate parenchymal volume loss. 4. No large vessel occlusion, high-grade stenosis, or aneurysm of the circle of Willis. Areas of vessel irregularity and mild stenosis consistent with intracranial atherosclerosis. 5. Moderate 50% stenosis of the proximal right internal carotid artery. No significant stenosis of the left carotid artery. 6. Patent vertebral arteries. Tortuous and irregular left vertebral artery probably due to atherosclerosis, possibly sequelae of fibromuscular dysplasia. Electronically Signed    By: Kristine Garbe M.D.   On: 11/15/2015 22:46   Mr Angiogram Neck Wo Contrast  Result Date: 11/15/2015 CLINICAL DATA:  82 y/o M; 1 day history of visual changes to the left eye and dizziness. EXAM: MRI HEAD WITHOUT CONTRAST MRA HEAD WITHOUT CONTRAST MRA NECK WITHOUT CONTRAST TECHNIQUE: Multiplanar, multiecho pulse sequences of the brain and surrounding structures were obtained without intravenous contrast. Angiographic images of the Circle of Willis were obtained using MRA technique without intravenous contrast. Angiographic images of the neck were obtained using MRA technique without intravenous contrast. Carotid stenosis measurements (when applicable) are obtained utilizing NASCET criteria, using the distal internal carotid diameter as the denominator. COMPARISON:  None. FINDINGS: MRI HEAD FINDINGS Brain: Chronic infarcts within the right posterior lateral frontal lobe, right high parietal lobe, and right posterior temporal parietal lobe with some extension into occipital lobe. Background of mild to moderate parenchymal volume loss and chronic microvascular ischemic changes. No diffusion restriction to suggest acute/early subacute infarct. There is hemosiderin staining of the large right posterior temporal/parietal infarct and punctate foci of susceptibility in the right cerebellar hemisphere and left frontal lobe compatible with hemosiderin deposition. No evidence for acute intracranial hemorrhage or focal mass effect. Extra-axial space: No hydrocephalus. No midline shift. No effacement of basilar cisterns. No extra-axial collection  is identified. Low-lying cerebellar tonsils 8 mm below the foramen magnum. Other: Mild mucosal thickening of the paranasal sinuses. No abnormal signal of the mastoid air cells. Orbits are unremarkable. Calvarium is unremarkable. MRA HEAD FINDINGS Internal carotid arteries: Patent. Diminished flow related signal within the proximal left cavernous segment probably  reflects underlying stenosis. The carotid siphons bilaterally are irregular in contour consistent with atherosclerotic changes. Anterior cerebral arteries:  Patent. Middle cerebral arteries: Patent. Anterior communicating artery: Patent. Irregularity of the left A1 segment with moderate stenosis. Posterior communicating arteries:  Patent. Posterior cerebral arteries: Patent. Short segment of mild stenosis of the left P1 segment. Basilar artery:  Patent. Vertebral arteries:  Patent.  Left dominant vertebrobasilar system. No evidence of high-grade stenosis, large vessel occlusion, or aneurysm unless noted above. MRA NECK FINDINGS Right common carotid artery: Patent. Right internal carotid artery: Plaque of the bifurcation and proximal vessel with moderate 50% stenosis. Right vertebral artery: Patent. Left common carotid artery: Patent. Left Internal carotid artery: Patent.  No significant stenosis. Left Vertebral artery: Tortuous and irregular throughout the entirety of the V2 segment which may be due to atherosclerosis or sequelae of fibromuscular dysplasia. IMPRESSION: 1. No evidence of acute/early subacute infarct, intracranial hemorrhage, focal mass effect, or hydrocephalus. 2. Several chronic infarcts in the right frontal lobe, right parietal lobe, and right temporoparietal junction. 3. Mild chronic microvascular ischemic changes and mild to moderate parenchymal volume loss. 4. No large vessel occlusion, high-grade stenosis, or aneurysm of the circle of Willis. Areas of vessel irregularity and mild stenosis consistent with intracranial atherosclerosis. 5. Moderate 50% stenosis of the proximal right internal carotid artery. No significant stenosis of the left carotid artery. 6. Patent vertebral arteries. Tortuous and irregular left vertebral artery probably due to atherosclerosis, possibly sequelae of fibromuscular dysplasia. Electronically Signed   By: Kristine Garbe M.D.   On: 11/15/2015 22:46   Mr  Brain Wo Contrast  Result Date: 11/15/2015 CLINICAL DATA:  82 y/o M; 1 day history of visual changes to the left eye and dizziness. EXAM: MRI HEAD WITHOUT CONTRAST MRA HEAD WITHOUT CONTRAST MRA NECK WITHOUT CONTRAST TECHNIQUE: Multiplanar, multiecho pulse sequences of the brain and surrounding structures were obtained without intravenous contrast. Angiographic images of the Circle of Willis were obtained using MRA technique without intravenous contrast. Angiographic images of the neck were obtained using MRA technique without intravenous contrast. Carotid stenosis measurements (when applicable) are obtained utilizing NASCET criteria, using the distal internal carotid diameter as the denominator. COMPARISON:  None. FINDINGS: MRI HEAD FINDINGS Brain: Chronic infarcts within the right posterior lateral frontal lobe, right high parietal lobe, and right posterior temporal parietal lobe with some extension into occipital lobe. Background of mild to moderate parenchymal volume loss and chronic microvascular ischemic changes. No diffusion restriction to suggest acute/early subacute infarct. There is hemosiderin staining of the large right posterior temporal/parietal infarct and punctate foci of susceptibility in the right cerebellar hemisphere and left frontal lobe compatible with hemosiderin deposition. No evidence for acute intracranial hemorrhage or focal mass effect. Extra-axial space: No hydrocephalus. No midline shift. No effacement of basilar cisterns. No extra-axial collection is identified. Low-lying cerebellar tonsils 8 mm below the foramen magnum. Other: Mild mucosal thickening of the paranasal sinuses. No abnormal signal of the mastoid air cells. Orbits are unremarkable. Calvarium is unremarkable. MRA HEAD FINDINGS Internal carotid arteries: Patent. Diminished flow related signal within the proximal left cavernous segment probably reflects underlying stenosis. The carotid siphons bilaterally are irregular in  contour consistent with atherosclerotic changes.  Anterior cerebral arteries:  Patent. Middle cerebral arteries: Patent. Anterior communicating artery: Patent. Irregularity of the left A1 segment with moderate stenosis. Posterior communicating arteries:  Patent. Posterior cerebral arteries: Patent. Short segment of mild stenosis of the left P1 segment. Basilar artery:  Patent. Vertebral arteries:  Patent.  Left dominant vertebrobasilar system. No evidence of high-grade stenosis, large vessel occlusion, or aneurysm unless noted above. MRA NECK FINDINGS Right common carotid artery: Patent. Right internal carotid artery: Plaque of the bifurcation and proximal vessel with moderate 50% stenosis. Right vertebral artery: Patent. Left common carotid artery: Patent. Left Internal carotid artery: Patent.  No significant stenosis. Left Vertebral artery: Tortuous and irregular throughout the entirety of the V2 segment which may be due to atherosclerosis or sequelae of fibromuscular dysplasia. IMPRESSION: 1. No evidence of acute/early subacute infarct, intracranial hemorrhage, focal mass effect, or hydrocephalus. 2. Several chronic infarcts in the right frontal lobe, right parietal lobe, and right temporoparietal junction. 3. Mild chronic microvascular ischemic changes and mild to moderate parenchymal volume loss. 4. No large vessel occlusion, high-grade stenosis, or aneurysm of the circle of Willis. Areas of vessel irregularity and mild stenosis consistent with intracranial atherosclerosis. 5. Moderate 50% stenosis of the proximal right internal carotid artery. No significant stenosis of the left carotid artery. 6. Patent vertebral arteries. Tortuous and irregular left vertebral artery probably due to atherosclerosis, possibly sequelae of fibromuscular dysplasia. Electronically Signed   By: Kristine Garbe M.D.   On: 11/15/2015 22:46   Subjective: Pt says that he feels much better, no more vision changes.  No  headache, no chest pain or SOB.  Discharge Exam: Vitals:   11/17/15 0436 11/17/15 0812  BP: 129/76 105/78  Pulse: 86 (!) 106  Resp: 16 16  Temp: 97.5 F (36.4 C) 98.6 F (37 C)   Vitals:   11/16/15 1630 11/16/15 2118 11/17/15 0436 11/17/15 0812  BP: 136/72 129/74 129/76 105/78  Pulse: 88 86 86 (!) 106  Resp: 18 16 16 16   Temp: 98.5 F (36.9 C) 98.3 F (36.8 C) 97.5 F (36.4 C) 98.6 F (37 C)  TempSrc: Oral Oral Oral Oral  SpO2: 99% 97% 94% 97%  Weight:  65.2 kg (143 lb 11.8 oz)    Height:         General:Appears calm and comfortable and is NAD, cooperative.   Eyes:PERRL, EOMI, normal lids, iris  [id number] normal hearing, lips &tongue, mmm; s/p cleft palate repair remotely  Neck:no LAD, masses or thyromegaly  Cardiovascular:RRR, no m/r/g. No LE edema.   Respiratory:CTA bilaterally, no w/r/r. Normal respiratory effort.  Abdomen:soft, ntnd, NABS  Skin:no rash or induration seen on limited exam  Musculoskeletal:grossly normal tone BUE/BLE, good ROM, no bony abnormality  Psychiatric:grossly normal mood and affect, speech fluent and appropriate, AOx3  Neurologic:CN 2-12 grossly intact, moves all extremities in coordinated fashion, sensation intact   The results of significant diagnostics from this hospitalization (including imaging, microbiology, ancillary and laboratory) are listed below for reference.     Microbiology: No results found for this or any previous visit (from the past 240 hour(s)).   Labs: BNP (last 3 results) No results for input(s): BNP in the last 8760 hours. Basic Metabolic Panel:  Recent Labs Lab 11/15/15 1642 11/15/15 2255 11/16/15 0150 11/16/15 0457 11/17/15 0407  NA 136  --  137 139 135  K 6.0* 4.9 4.9 5.0 4.7  CL 112*  --  111 108 107  CO2 17*  --  20* 21* 22  GLUCOSE 133*  --  172* 153* 148*  BUN 50*  --  39* 37* 32*  CREATININE 2.35*  --  1.94* 1.75* 1.32*  CALCIUM 8.8*  --  9.2 9.5 9.1   Liver  Function Tests: No results for input(s): AST, ALT, ALKPHOS, BILITOT, PROT, ALBUMIN in the last 168 hours. No results for input(s): LIPASE, AMYLASE in the last 168 hours. No results for input(s): AMMONIA in the last 168 hours. CBC:  Recent Labs Lab 11/16/15 0457  WBC 6.8  HGB 11.4*  HCT 33.8*  MCV 95.8  PLT 111*   Cardiac Enzymes: No results for input(s): CKTOTAL, CKMB, CKMBINDEX, TROPONINI in the last 168 hours. BNP: Invalid input(s): POCBNP CBG:  Recent Labs Lab 11/16/15 1155 11/16/15 1726 11/16/15 2110 11/17/15 0810 11/17/15 1200  GLUCAP 168* 170* 181* 171* 135*   D-Dimer No results for input(s): DDIMER in the last 72 hours. Hgb A1c No results for input(s): HGBA1C in the last 72 hours. Lipid Profile No results for input(s): CHOL, HDL, LDLCALC, TRIG, CHOLHDL, LDLDIRECT in the last 72 hours. Thyroid function studies No results for input(s): TSH, T4TOTAL, T3FREE, THYROIDAB in the last 72 hours.  Invalid input(s): FREET3 Anemia work up No results for input(s): VITAMINB12, FOLATE, FERRITIN, TIBC, IRON, RETICCTPCT in the last 72 hours. Urinalysis    Component Value Date/Time   COLORURINE YELLOW 11/15/2015 [place] 11/15/2015 1645   LABSPEC 1.020 11/15/2015 1645   PHURINE 5.0 11/15/2015 1645   GLUCOSEU NEGATIVE 11/15/2015 1645   HGBUR NEGATIVE 11/15/2015 1645   BILIRUBINUR NEGATIVE 11/15/2015 1645   KETONESUR NEGATIVE 11/15/2015 1645   PROTEINUR NEGATIVE 11/15/2015 1645   UROBILINOGEN 0.2 06/13/2013 1400   NITRITE NEGATIVE 11/15/2015 1645   LEUKOCYTESUR NEGATIVE 11/15/2015 1645   Sepsis Labs Invalid input(s): PROCALCITONIN,  WBC,  LACTICIDVEN Microbiology No results found for this or any previous visit (from the past 240 hour(s)).   Time coordinating discharge: 29 minutes   SIGNED:   Irwin Brakeman, MD  [facility] 11/17/2015, 12:59 PM Pager   If 7PM-7AM, please contact night-coverage [email or web address] Password [email or web address]

## 2015-12-31 ENCOUNTER — Encounter: Payer: Self-pay | Admitting: Internal Medicine

## 2015-12-31 ENCOUNTER — Ambulatory Visit (INDEPENDENT_AMBULATORY_CARE_PROVIDER_SITE_OTHER): Payer: Medicare Other | Admitting: Internal Medicine

## 2015-12-31 VITALS — BP 148/70 | HR 91 | Ht 64.0 in | Wt 148.8 lb

## 2015-12-31 DIAGNOSIS — I6523 Occlusion and stenosis of bilateral carotid arteries: Secondary | ICD-10-CM

## 2015-12-31 DIAGNOSIS — E7800 Pure hypercholesterolemia, unspecified: Secondary | ICD-10-CM | POA: Diagnosis not present

## 2015-12-31 DIAGNOSIS — I5022 Chronic systolic (congestive) heart failure: Secondary | ICD-10-CM

## 2015-12-31 DIAGNOSIS — I739 Peripheral vascular disease, unspecified: Secondary | ICD-10-CM | POA: Diagnosis not present

## 2015-12-31 DIAGNOSIS — I5032 Chronic diastolic (congestive) heart failure: Secondary | ICD-10-CM | POA: Diagnosis not present

## 2015-12-31 NOTE — Patient Instructions (Signed)
Medication Instructions:  Your physician recommends that you continue on your current medications as directed. Please refer to the Current Medication list given to you today.  Labwork: None   Testing/Procedures: None   Follow-Up: Your physician recommends that you schedule a follow-up appointment in: 3 MONTHS with Dr Debara Pickett.  Any Other Special Instructions Will Be Listed Below (If Applicable).     If you need a refill on your cardiac medications before your next appointment, please call your pharmacy.

## 2016-01-02 DIAGNOSIS — I5032 Chronic diastolic (congestive) heart failure: Secondary | ICD-10-CM | POA: Insufficient documentation

## 2016-01-02 NOTE — Progress Notes (Signed)
OFFICE NOTE  Chief Complaint:  Routine follow-up  Primary Care Physician: [name], MD  HPI:  [name] is an 82 year old male that I had previously seen in 2011. 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 He underwent stent grafting and did well from that. He has as history of hypertension and aortic sclerosis. Since I saw him last, he has had a number of events including chronic diastolic HF (EF 0000000 in 3/15), presumed CAD, extensive PAD with left femoropopliteal bypass in 1999 preceded by right internal iliac stenting in 1997. 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 He said he also had stent grafting of a penetrating ulcer in the descending and thoracic aorta (done at [facility], 2003). He has diabetes on oral medications as well as hypertension and hyperlipidemia.   He was recently admitted 3/16/-06/16/13 for increased weight gain and worsening LE edema. ECHO showed EF 55-60% with mild diastolic dysfx. Discharge weight 146 lbs. Hospitalized in Feb 2015 with the flu and NSTEMI EF at that time 45%. He was seen last week by Dr. Haroldine Laws in the heart failure clinic for followup. It was noted that he was euvolemic and was maintaining his dry weight without worsening shortness of breath. He was subsequently referred back to me for followup.  He has no complaints today.  I saw [name] back in the office today. He denies any worsening shortness of breath or chest discomfort. He continues to be as active as he can be but walks with a walker. He's had no worsening swelling or weight gain since I last saw him in the office. His weight has generally stayed within 1 or 2 pounds of his dry weight. There has been no further TIAs or strokes that I'm aware of. He was recently seen by Dr. Oneida Alar who had performed a CTA which shows  stable aortic graft architecture.  [name] returns today for follow-up. Of note his blood pressure is slightly low today with systolic below [id number]. He says it usually ranges between [id number] and [id number] systolic. He denies any chest pain or shortness of breath. He has a stable aortic murmur and echo last year showed a roughly well preserved EF without any significant aortic valvular disease although it was of decreased quality.  [name] returns today for follow-up. He denies any chest pain or worsening shortness of breath. Blood pressures been well controlled. He has had a stable aortic sclerosis without stenosis by echocardiography. He denies any lower extremity claudication. He is followed by Dr. Oneida Alar for his vascular disease.  10/29/2015  [name] returns today for follow-up. He denies any new symptoms such as shortness of breath, worsening fatigue or chest pain. I repeated an echo for follow-up of diastolic congestive heart failure. Unfortunately it shows newly reduced systolic dysfunction with an EF of 45-50% and global hypokinesis. The etiology of this is not clear although may be related to worsening coronary disease however he denies any angina or any new symptoms of decompensated congestive heart failure. Weight has been very stable. We discussed options for workup including stress testing or invasive procedures although since he is asymptomatic it difficult to recommend this. Another option would be trying to optimize her adjust his medical therapy for most heart failure benefit. Currently he is on beta blocker but not on ACE inhibitor or ARB  or an ARNI. He tells me he is scheduled to get lab work tomorrow for an upcoming visit with Dr. Joylene Draft.  12/31/2015  [name] was seen today in follow-up. Overall he seems to be doing fairly well. His weight is been stable. He denies any worsening shortness of breath, orthopnea or PND. He has had trouble with hyperkalemia and was tried on an ARB but had problems  with elevated potassium therefore it was discontinued. I do not suspect he will tolerate Entresto for the same reasons. For now we will continue with carvedilol.  PMHx:  Past Medical History:  Diagnosis Date  . Anemia   . CHF (congestive heart failure) ([place]) 05/2013  . Degenerative disc disease, lumbar   . Diabetes mellitus, type 2 ([place])   . Flu Feb. 9, 2015   ?  Mild Heart Attack  . GERD (gastroesophageal reflux disease)   . H/O thoracic aortic aneurysm repair    Stent Graft (penetrating ulcer) - r/t staphylococcal infection  . High cholesterol   . History of tobacco use    50 pack year history, quit in 2002  . Hypertension   . Left carotid artery occlusion    moderate R carotid disease; L Common Carotid Bypassed during Arch Repair.  Marland Kitchen MRSA infection    s/p '02-no problems now  . Myocardial infarction Feb. 9, 2015   Mild Heart attack  . Osteoporosis, senile   . PAD (peripheral artery disease) (HCC)    s/p L Fem-Pop Bypass; R Iliac PTA  . Seasonal allergies   . Stroke ([place])   . Transfusion history    '02- back surgery    Past Surgical History:  Procedure Laterality Date  . 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 BACK SURGERY  2001  . CLEFT LIP REPAIR    . COLONOSCOPY WITH PROPOFOL N/A 08/10/2012   Procedure: COLONOSCOPY WITH PROPOFOL;  Surgeon: Garlan Fair, MD;  Location: [facility];  Service: Endoscopy;  Laterality: N/A;  . ESOPHAGOGASTRODUODENOSCOPY (EGD) WITH PROPOFOL N/A 08/10/2012   Procedure: ESOPHAGOGASTRODUODENOSCOPY (EGD) WITH PROPOFOL;  Surgeon: Garlan Fair, MD;  Location: [facility];  Service: Endoscopy;  Laterality: N/A;  . HEMIARTHROPLASTY HIP Right   . ILIAC ARTERY STENT Right    PTA -STENT  . JOINT REPLACEMENT Right 2003   right hip   . PR VEIN BYPASS GRAFT,AORTO-FEM-POP  1999  .  THORACIC AORTIC ENDOVASCULAR STENT GRAFT  2003   @ [facility]  - for Penetrating Ulcer/Aneurysm (cardiac cath done 01/08/2001 by Dr. Antionette Poles)  . TRANSTHORACIC ECHOCARDIOGRAM  10/2009   EF=>55%, LA mildly dilated, mild mitral annular calfication, mild MR, mild TR, RVSP 30-53mmHg, AV mildly sclerotic, trace pulm valve regurg  . VASCULAR SURGERY      FAMHx:  Family History  Problem Relation Age of Onset  . Diabetes Mother   . Hypertension Mother     heart disese, died of MI at 65  . Hyperlipidemia Mother   . Heart disease Father     CHF  . Heart attack Father   . Arthritis Father   . Diabetes Sister     also Alzheimers  . Heart disease Son     Heart Disease before age 26  . Marfan syndrome Daughter   . AAA (abdominal aortic aneurysm) Son     repair  in 63s    SOCHx:   reports that he quit smoking about 15 years ago. He has never used smokeless tobacco. He reports that he does not drink alcohol or use drugs.  ALLERGIES:  Allergies  Allergen Reactions  . Codeine Nausea And Vomiting    ROS: A comprehensive review of systems was negative.  HOME MEDS: Current Outpatient Prescriptions  Medication Sig Dispense Refill  . amoxicillin (AMOXIL) 500 MG capsule Take 2,000 mg by mouth as needed (1 hour before dental appointments).   1  . aspirin EC 81 MG tablet Take 81 mg by mouth every morning.     Marland Kitchen atorvastatin (LIPITOR) 40 MG tablet Take 20 mg by mouth every evening.     Marland Kitchen CARTIA XT 240 MG 24 hr capsule Take 240 mg by mouth every morning.    . carvedilol (COREG) 6.25 MG tablet Take 6.25 mg by mouth 2 (two) times daily with a meal.    . Cholecalciferol (VITAMIN D) 2000 UNITS tablet Take 2,000 Units by mouth daily.    . diphenhydrAMINE (BENADRYL) 25 MG tablet Take 25 mg by mouth at bedtime.    Marland Kitchen doxazosin (CARDURA) 2 MG tablet Take 2 mg by mouth daily.     . Ferrous Gluconate (IRON) 246 (28 FE) MG TABS Take 2 tablets by mouth daily.    . fluticasone (FLONASE) 50 MCG/ACT nasal spray Place  2 sprays into the nose daily.    . furosemide (LASIX) 20 MG tablet Take 20 mg by mouth every other day. Take 1 tab every other day depending on weight gain    . glimepiride (AMARYL) 2 MG tablet Take 0.25 mg by mouth daily as needed (for blood sugar over 135 (1/8 of a 2 mg tablet)).     Marland Kitchen HYDROcodone-acetaminophen (NORCO/VICODIN) 5-325 MG per tablet Take 1 tablet by mouth every 6 (six) hours as needed for pain.     . isosorbide dinitrate (ISORDIL) 10 MG tablet Take 1 tablet (10 mg total) by mouth 3 (three) times daily. 90 tablet 2  . loratadine (CLARITIN) 10 MG tablet Take 10 mg by mouth daily.    . Multiple Vitamin (MULTIVITAMIN) capsule Take 1 capsule by mouth daily.    . niacin (SLO-NIACIN) 500 MG tablet Take 500 mg by mouth daily.     . Omega-3 Fatty Acids (FISH OIL) 1200 MG CAPS Take 4,800 mg by mouth every morning.     Marland Kitchen omeprazole (PRILOSEC) 20 MG capsule Take 20 mg by mouth daily.    . ONE TOUCH ULTRA TEST test strip 1 strip by Other route daily. Use 1 strip to check glucose in the mornings daily  5  . polyethylene glycol (MIRALAX / GLYCOLAX) packet Take 17 g by mouth daily.    Marland Kitchen PREVIDENT 5000 BOOSTER PLUS 1.1 % PSTE Use once in place of toothpaste brush 2 minutes then spit. DO NOT RINSE  0  . temazepam (RESTORIL) 30 MG capsule Take 30 mg by mouth at bedtime as needed for sleep. For sleep     No current facility-administered medications for this visit.     LABS/IMAGING: No results found for this or any previous visit (from the past 48 hour(s)). No results found.  VITALS: BP (!) 148/70   Pulse 91   Ht 5\' 4"  (1.626 m)   Wt 148 lb 12.8 oz (67.5 kg)   SpO2 97%   BMI 25.54 kg/m   EXAM: General appearance: alert and no distress Neck: no JVD and bilateral carotid  bruits, worse on right than left Lungs: clear to auscultation bilaterally Heart: regular rate and rhythm, S1, S2 normal and systolic murmur: early systolic 3/6, crescendo at 2nd right intercostal space Abdomen: soft,  non-tender; bowel sounds normal; no masses,  no organomegaly Extremities: extremities normal, atraumatic, no cyanosis or edema Pulses: 2+ and symmetric Skin: Skin color, texture, turgor normal. No rashes or lesions Neurologic: Mental status: Alert, oriented, thought content appropriate, Speech impairment Psych: Normal mood, affect  EKG: Deferred  ASSESSMENT: 1. New systolic CHF (EF [id number] - 2017), history of diastolic heart failure 2. Peripheral arterial disease with bilateral carotid artery disease 3. History of L fem-pop, right femoral PTA 4. History of mycotic thoracic aortic aneurysm s/p stent graft repair 5. Stroke 6. DM2 7. HTN 8. Dyslipidemia 9. History of NSTEMI - presumed CAD, asymptomatic 10. Systolic murmur of aortic sclerosis  PLAN: 1.   [name] seems to be stable with his EF in the low 40s. He is euvolemic at this time. Unfortunate could not tolerate ARB and likely will not tolerate and dressed or due to hyperkalemia. We'll continue his current medication. He will need repeat echo in one year. Vascular surgery is following up history of mycotic thoracic aortic aneurysm status post stent graft repair and history of left femoropopliteal and right femoral PTA.  Follow-up with me in 3 months.  Pixie Casino, MD, [facility] Attending Cardiologist [place] 01/02/2016, 6:19 PM

## 2016-01-25 ENCOUNTER — Emergency Department (HOSPITAL_COMMUNITY): Payer: Medicare Other

## 2016-01-25 ENCOUNTER — Emergency Department (HOSPITAL_COMMUNITY)
Admission: EM | Admit: 2016-01-25 | Discharge: 2016-01-25 | Disposition: A | Discharge status: Home / Self Care | Payer: Medicare Other | Attending: Emergency Medicine | Admitting: Emergency Medicine

## 2016-01-25 ENCOUNTER — Encounter (HOSPITAL_COMMUNITY): Payer: Self-pay

## 2016-01-25 DIAGNOSIS — I110 Hypertensive heart disease with heart failure: Secondary | ICD-10-CM | POA: Insufficient documentation

## 2016-01-25 DIAGNOSIS — Y999 Unspecified external cause status: Secondary | ICD-10-CM | POA: Insufficient documentation

## 2016-01-25 DIAGNOSIS — Z8673 Personal history of transient ischemic attack (TIA), and cerebral infarction without residual deficits: Secondary | ICD-10-CM | POA: Insufficient documentation

## 2016-01-25 DIAGNOSIS — I5032 Chronic diastolic (congestive) heart failure: Secondary | ICD-10-CM | POA: Diagnosis not present

## 2016-01-25 DIAGNOSIS — W010XXA Fall on same level from slipping, tripping and stumbling without subsequent striking against object, initial encounter: Secondary | ICD-10-CM | POA: Insufficient documentation

## 2016-01-25 DIAGNOSIS — Z87891 Personal history of nicotine dependence: Secondary | ICD-10-CM | POA: Insufficient documentation

## 2016-01-25 DIAGNOSIS — E119 Type 2 diabetes mellitus without complications: Secondary | ICD-10-CM | POA: Diagnosis not present

## 2016-01-25 DIAGNOSIS — Z7982 Long term (current) use of aspirin: Secondary | ICD-10-CM | POA: Diagnosis not present

## 2016-01-25 DIAGNOSIS — S42291A Other displaced fracture of upper end of right humerus, initial encounter for closed fracture: Secondary | ICD-10-CM | POA: Diagnosis not present

## 2016-01-25 DIAGNOSIS — Y92009 Unspecified place in unspecified non-institutional (private) residence as the place of occurrence of the external cause: Secondary | ICD-10-CM | POA: Diagnosis not present

## 2016-01-25 DIAGNOSIS — S6991XA Unspecified injury of right wrist, hand and finger(s), initial encounter: Secondary | ICD-10-CM | POA: Diagnosis present

## 2016-01-25 DIAGNOSIS — W19XXXA Unspecified fall, initial encounter: Secondary | ICD-10-CM

## 2016-01-25 DIAGNOSIS — Y9301 Activity, walking, marching and hiking: Secondary | ICD-10-CM | POA: Insufficient documentation

## 2016-01-25 DIAGNOSIS — I5022 Chronic systolic (congestive) heart failure: Secondary | ICD-10-CM | POA: Diagnosis not present

## 2016-01-25 MED ORDER — MORPHINE SULFATE (PF) 4 MG/ML IV SOLN: 4.0000 mg | Freq: Once | INTRAVENOUS | Status: DC | PRN

## 2016-01-25 MED ORDER — HYDROCODONE-ACETAMINOPHEN 5-325 MG PO TABS: 1.0000 | ORAL_TABLET | Freq: Four times a day (QID) | ORAL | 0 refills | Status: DC

## 2016-01-25 NOTE — ED Provider Notes (Signed)
[facility] DEPT Provider Note   CSN: [id number] Arrival date & time: 01/25/16  1930     History   Chief Complaint Chief Complaint  Patient presents with  . Fall    HPI [name] is a 82 y.o. male.  HPI Pt was walking in his house when he tripped with his slippers and fell over a stool.  He landed on his right shoulder and now has pain in his right upper arm.  No head injury.   No hip or leg pain.   No LOC.  No blood thinners.    NO other complaints. Past Medical History:  Diagnosis Date  . Anemia   . CHF (congestive heart failure) ([place]) 05/2013  . Degenerative disc disease, lumbar   . Diabetes mellitus, type 2 ([place])   . Flu Feb. 9, 2015   ?  Mild Heart Attack  . GERD (gastroesophageal reflux disease)   . H/O thoracic aortic aneurysm repair    Stent Graft (penetrating ulcer) - r/t staphylococcal infection  . High cholesterol   . History of tobacco use    50 pack year history, quit in 2002  . Hypertension   . Left carotid artery occlusion    moderate R carotid disease; L Common Carotid Bypassed during Arch Repair.  Marland Kitchen MRSA infection    s/p '02-no problems now  . Myocardial infarction Feb. 9, 2015   Mild Heart attack  . Osteoporosis, senile   . PAD (peripheral artery disease) ([facility])    s/p L Fem-Pop Bypass; R Iliac PTA  . Seasonal allergies   . Stroke ([place])   . Transfusion history    '02- back surgery    Patient Active Problem List   Diagnosis Date Noted  . Chronic diastolic congestive heart failure ([place]) 01/02/2016  . Blurred vision 11/16/2015  . Dizziness and giddiness 11/16/2015  . Hyperkalemia 11/15/2015  . Bilateral carotid artery occlusion 10/29/2015  . Peripheral vascular disease, unspecified 08/18/2013  . Chronic systolic CHF (congestive heart failure) ([place]) 07/01/2013  . Pulmonary edema 06/13/2013  . Acute decompensated heart failure ([place]) 06/13/2013  . Thrombocytopenia ([place]) 06/13/2013  . Influenza A with respiratory manifestations  05/10/2013  . Acute respiratory failure with hypoxia ([place]) 05/10/2013  . Dehydration 05/10/2013  . NSTEMI, initial episode of care ([place]) 05/09/2013  . Fever of undetermined origin 05/09/2013  . Productive cough 05/09/2013  . Acute renal failure ([place]) 05/09/2013  . NSTEMI (non-ST elevated myocardial infarction) ([place]) 05/09/2013  . Pulmonary infiltrate in right lung on chest x-ray 05/09/2013  . History of right PCA stroke - right occipital 05/09/2013  . Hypertension   . High cholesterol   . PAD (peripheral artery disease) ([place])   . Diabetes mellitus, type 2 ([place])   . Infected sebaceous cyst 04/07/2013  . Occlusion and stenosis of carotid artery without mention of cerebral infarction 10/23/2011    Past Surgical History:  Procedure Laterality Date  . 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 BACK SURGERY  2001  . CLEFT LIP REPAIR    . COLONOSCOPY WITH PROPOFOL N/A 08/10/2012   Procedure: COLONOSCOPY WITH PROPOFOL;  Surgeon: Garlan Fair, MD;  Location: [facility];  Service: Endoscopy;  Laterality: N/A;  . ESOPHAGOGASTRODUODENOSCOPY (EGD) WITH PROPOFOL N/A 08/10/2012   Procedure: ESOPHAGOGASTRODUODENOSCOPY (EGD) WITH PROPOFOL;  Surgeon: Garlan Fair,  MD;  Location: [facility];  Service: Endoscopy;  Laterality: N/A;  . HEMIARTHROPLASTY HIP Right   . ILIAC ARTERY STENT Right    PTA -STENT  . JOINT REPLACEMENT Right 2003   right hip   . PR VEIN BYPASS GRAFT,AORTO-FEM-POP  1999  . THORACIC AORTIC ENDOVASCULAR STENT GRAFT  2003   @ [facility]  - for Penetrating Ulcer/Aneurysm (cardiac cath done 01/08/2001 by Dr. Antionette Poles)  . TRANSTHORACIC ECHOCARDIOGRAM  10/2009   EF=>55%, LA mildly dilated, mild mitral annular calfication, mild MR, mild TR, RVSP 30-50mmHg, AV mildly sclerotic, trace pulm valve regurg  . VASCULAR SURGERY          Home Medications    Prior to Admission medications   Medication Sig Start Date End Date Taking? Authorizing Provider  amoxicillin (AMOXIL) 500 MG capsule Take 2,000 mg by mouth as needed (1 hour before dental appointments).  03/02/15  Yes Historical Provider, MD  aspirin EC 81 MG tablet Take 81 mg by mouth every morning.    Yes Historical Provider, MD  atorvastatin (LIPITOR) 40 MG tablet Take 20 mg by mouth every evening.    Yes Historical Provider, MD  CARTIA XT 240 MG 24 hr capsule Take 240 mg by mouth every morning. 09/02/15  Yes Historical Provider, MD  carvedilol (COREG) 6.25 MG tablet Take 6.25 mg by mouth 2 (two) times daily with a meal.   Yes Historical Provider, MD  Cholecalciferol (VITAMIN D) 2000 UNITS tablet Take 2,000 Units by mouth daily.   Yes Historical Provider, MD  diphenhydrAMINE (BENADRYL) 25 MG tablet Take 25 mg by mouth at bedtime.   Yes Historical Provider, MD  doxazosin (CARDURA) 2 MG tablet Take 2 mg by mouth daily.  06/04/13  Yes Historical Provider, MD  Ferrous Gluconate (IRON) 246 (28 FE) MG TABS Take 2 tablets by mouth daily.   Yes Historical Provider, MD  fluticasone (FLONASE) 50 MCG/ACT nasal spray Place 1 spray into the nose 2 (two) times daily.    Yes Historical Provider, MD  furosemide (LASIX) 20 MG tablet Take 20 mg by mouth every other day. Take 1 tab every other day depending on weight gain 02/22/15  Yes Historical Provider, MD  glimepiride (AMARYL) 2 MG tablet Take 0.25 mg by mouth daily as needed (for blood sugar over 135 (1/8 of a 2 mg tablet)).    Yes Historical Provider, MD  isosorbide dinitrate (ISORDIL) 10 MG tablet Take 1 tablet (10 mg total) by mouth 3 (three) times daily. 05/16/13  Yes Erline Hau, MD  loratadine (CLARITIN) 10 MG tablet Take 10 mg by mouth daily.   Yes Historical Provider, MD  Multiple Vitamin (MULTIVITAMIN) capsule Take 1 capsule by mouth daily.   Yes Historical Provider, MD  niacin (SLO-NIACIN) 500 MG tablet Take  500 mg by mouth daily.    Yes Historical Provider, MD  Omega-3 Fatty Acids (FISH OIL) 1200 MG CAPS Take 4,800 mg by mouth every morning.    Yes Historical Provider, MD  omeprazole (PRILOSEC) 20 MG capsule Take 20 mg by mouth daily.   Yes Historical Provider, MD  polyethylene glycol (MIRALAX / GLYCOLAX) packet Take 17 g by mouth every evening.    Yes Historical Provider, MD  PREVIDENT 5000 BOOSTER PLUS 1.1 % PSTE Take 1 application by mouth every evening.  Use once in place of toothpaste brush 2 minutes then spit. DO NOT RINSE 04/03/15  Yes Historical Provider, MD  temazepam (RESTORIL) 30 MG capsule Take 30 mg by mouth  at bedtime. For sleep   Yes Historical Provider, MD  HYDROcodone-acetaminophen (NORCO/VICODIN) 5-325 MG tablet Take 1 tablet by mouth every 6 (six) hours. 01/25/16   Dorie Rank, MD    Family History Family History  Problem Relation Age of Onset  . Diabetes Mother   . Hypertension Mother     heart disese, died of MI at 9  . Hyperlipidemia Mother   . Heart disease Father     CHF  . Heart attack Father   . Arthritis Father   . Diabetes Sister     also Alzheimers  . Heart disease Son     Heart Disease before age 78  . Marfan syndrome Daughter   . AAA (abdominal aortic aneurysm) Son     repair in 56s    Social History Social History  Substance Use Topics  . Smoking status: Former Smoker    Quit date: 09/24/2000  . Smokeless tobacco: Never Used  . Alcohol use No     Allergies   Codeine   Review of Systems Review of Systems  All other systems reviewed and are negative.    Physical Exam Updated Vital Signs BP 150/87   Pulse 96   Temp 98.6 F (37 C) (Oral)   Resp 14   SpO2 99%   Physical Exam  Constitutional: No distress.  HENT:  Head: Normocephalic and atraumatic.  Right Ear: External ear normal.  Left Ear: External ear normal.  Eyes: Conjunctivae are normal. Right eye exhibits no discharge. Left eye exhibits no discharge. No scleral icterus.  Neck:  Neck supple. No tracheal deviation present.  Cardiovascular: Normal rate, regular rhythm and intact distal pulses.   Murmur (systolic) heard. Pulmonary/Chest: Effort normal and breath sounds normal. No stridor. No respiratory distress. He has no wheezes. He has no rales.  Abdominal: Soft. Bowel sounds are normal. He exhibits no distension. There is no tenderness. There is no rebound and no guarding.  Musculoskeletal: He exhibits no edema.       Right shoulder: He exhibits tenderness, bony tenderness and swelling.       Left shoulder: He exhibits no tenderness, no bony tenderness and no swelling.       Right elbow: Normal.      Right wrist: He exhibits no tenderness, no bony tenderness and no swelling.       Left wrist: He exhibits no tenderness, no bony tenderness and no swelling.       Right hip: He exhibits normal range of motion, no tenderness, no bony tenderness and no swelling.       Left hip: He exhibits normal range of motion, no tenderness and no bony tenderness.       Right ankle: He exhibits no swelling. No tenderness.       Left ankle: He exhibits no swelling. No tenderness.       Cervical back: He exhibits no tenderness, no bony tenderness and no swelling.       Thoracic back: He exhibits no tenderness, no bony tenderness and no swelling.       Lumbar back: He exhibits no tenderness, no bony tenderness and no swelling.       Right upper arm: He exhibits tenderness and bony tenderness.  Ecchymoses right elbow  Neurological: He is alert. He has normal strength. No cranial nerve deficit (no facial droop, extraocular movements intact, no slurred speech) or sensory deficit. He exhibits normal muscle tone. He displays no seizure activity. Coordination normal.  Skin: Skin is  warm and dry. No rash noted.  Psychiatric: He has a normal mood and affect.  Nursing note and vitals reviewed.    ED Treatments / Results  Labs (all labs ordered are listed, but only abnormal results are  displayed) Labs Reviewed - No data to display  EKG  EKG Interpretation None       Radiology Dg Shoulder Right  Result Date: 01/25/2016 CLINICAL DATA:  Fall with pain and swelling EXAM: RIGHT SHOULDER - 2+ VIEW COMPARISON:  None. FINDINGS: Soft tissue swelling over the right shoulder. Superior migration of the right humoral head suggests rotator cuff disease. There is an oblique fracture of the proximal shaft/subcapital portion of the right humerus. There is overriding of the fracture fragments. There is moderate angular deformity of the distal fracture fragment. Humeral head does not appear dislocated. AC joint degenerative changes. IMPRESSION: Angulated, overriding fracture of the proximal humerus. Electronically Signed   By: Donavan Foil M.D.   On: 01/25/2016 20:41   Dg Elbow 2 Views Right  Result Date: 01/25/2016 CLINICAL DATA:  Fall was swelling and deformity in EXAM: RIGHT ELBOW - 2 VIEW COMPARISON:  None. FINDINGS: Two views of the right elbow show no large elbow effusion. Radial head alignment grossly normal. No obvious fracture. IMPRESSION: No gross fracture malalignment of the right elbow Electronically Signed   By: Donavan Foil M.D.   On: 01/25/2016 20:37   Dg Humerus Right  Result Date: 01/25/2016 CLINICAL DATA:  Fall with arm pain and swelling EXAM: RIGHT HUMERUS - 2+ VIEW COMPARISON:  None. FINDINGS: Two views of the right humerus demonstrate an oblique fracture of the right humeral neck and proximal shaft. Fracture appears impacted, there is approximately 2.5 cm of overriding of fracture segments. Moderate angulation of posterior fracture fragment. The humeral head does not appear dislocated. The face min of the subacromial space presumed rotator cuff pathology. Soft tissue swelling over the shoulder. Bulky calcifications surrounding the right humeral head are again noted. There are suspected old right-sided rib fractures. There is degenerative change of the right AC joint.  IMPRESSION: Overriding and angulated fracture of the proximal humerus. Suspect old right-sided rib fractures. Electronically Signed   By: Donavan Foil M.D.   On: 01/25/2016 20:40    Procedures Procedures (including critical care time)  Medications Ordered in ED Medications - No data to display   Initial Impression / Assessment and Plan / ED Course  I have reviewed the triage vital signs and the nursing notes.  Pertinent labs & imaging results that were available during my care of the patient were reviewed by me and considered in my medical decision making (see chart for details).  Clinical Course  Pt presented to the ED after a mechanical fall.  Xrays demonstrate a proximal humerus fracture.  Sling imobilizer applied.  Outpatient referral to ortho  Final Clinical Impressions(s) / ED Diagnoses   Final diagnoses:  Other closed displaced fracture of proximal end of right humerus, initial encounter    New Prescriptions Discharge Medication List as of 01/25/2016 10:46 PM       Dorie Rank, MD 01/27/16 1032

## 2016-01-25 NOTE — ED Notes (Signed)
Patient transported to X-ray 

## 2016-01-25 NOTE — ED Triage Notes (Signed)
Patient comes from home for a fall.  Patient states his slippers are slippery and fell on his right arm.  Complains of pain to the right shoulder/elbow area.  Denies any LOC or dizziness.  Patient A&Ox4

## 2016-01-25 NOTE — Discharge Instructions (Signed)
Keep the arm in the sling. Continue your pain medications. Call Dr. Trevor Mace office to schedule a follow-up appointment

## 2016-02-06 ENCOUNTER — Encounter (INDEPENDENT_AMBULATORY_CARE_PROVIDER_SITE_OTHER): Payer: Self-pay | Admitting: Physician Assistant

## 2016-02-06 ENCOUNTER — Ambulatory Visit (INDEPENDENT_AMBULATORY_CARE_PROVIDER_SITE_OTHER): Payer: Medicare Other

## 2016-02-06 ENCOUNTER — Ambulatory Visit (INDEPENDENT_AMBULATORY_CARE_PROVIDER_SITE_OTHER): Payer: Medicare Other | Admitting: Physician Assistant

## 2016-02-06 DIAGNOSIS — S42201A Unspecified fracture of upper end of right humerus, initial encounter for closed fracture: Secondary | ICD-10-CM | POA: Diagnosis not present

## 2016-02-06 DIAGNOSIS — I6523 Occlusion and stenosis of bilateral carotid arteries: Secondary | ICD-10-CM

## 2016-02-06 NOTE — Progress Notes (Signed)
Office Visit Note   Patient: [name]           Date of Birth: July 06, 1930           MRN: [id number] Visit Date: 02/06/2016              Requested by: [name], MD [address] [place], [place] [address] PCP: [name], MD   Assessment & Plan: Visit Diagnoses:  1. Closed fracture of proximal end of right humerus, unspecified fracture morphology, initial encounter     Plan:Continue sling and swath right arm. He may come out of the sling for bathing and gentle range of motion of the wrist hand and elbow. 2 views of the right shoulder at return.  Follow-Up Instructions: Return in about 4 weeks (around 03/05/2016) for Radiographs.   Orders:  Orders Placed This Encounter  Procedures  . XR Shoulder Right   No orders of the defined types were placed in this encounter.     Procedures: No procedures performed   Clinical Data: No additional findings.   Subjective: Chief Complaint  Patient presents with  . Right Shoulder - Fracture    DOI: 01/25/2016    Mr. [name] is here today to follow up from Right humerus fracture that occurred on 01/25/2016. States that he fell on it. He was seen at the Emergency Room and had xray's taken, then placed in a sling. He says he is doing okay with it. He has been keeping in it the sling except to change clothes and bath. He is taking the Norco for pain, which he is on for chronic back pain.    Review of Systems   Objective: Vital Signs: There were no vitals taken for this visit.  Physical Exam  Constitutional: He is oriented to person, place, and time. He appears well-developed and well-nourished. No distress.  Neurological: He is alert and oriented to person, place, and time.  Psychiatric: He has a normal mood and affect.    Ortho Exam Right upper extremity with significant ecchymosis from the humerus down to the hand. No rashes skin lesions ulcerations impending ulcers. Pedal pulses intact. Sensation light touch the  hands and tight in full motor of the hand. He is nontender throughout the hand and forearm and elbow on the right. Has full supination pronation of the right forearm. Full extension and elbow flexion is full also. Tenderness over the proximal humerus. Gentle range of motion the shoulder shoulder has fluid motion. Specialty Comments:  No specialty comments available.  Imaging: Xr Shoulder Right  Result Date: 02/06/2016 AP and Y view of the right shoulder: AP view shows severe right shoulder arthropathy. Fracture of the proximal humerus subcapital region with angulation and overriding fracture fragments. Humeral head appears appropriately oriented to the glenoid. Y view is of poor diagnostic quality.    PMFS History: Patient Active Problem List   Diagnosis Date Noted  . Chronic diastolic congestive heart failure ([place]) 01/02/2016  . Blurred vision 11/16/2015  . Dizziness and giddiness 11/16/2015  . Hyperkalemia 11/15/2015  . Bilateral carotid artery occlusion 10/29/2015  . Peripheral vascular disease, unspecified 08/18/2013  . Chronic systolic CHF (congestive heart failure) ([place]) 07/01/2013  . Pulmonary edema 06/13/2013  . Acute decompensated heart failure ([place]) 06/13/2013  . Thrombocytopenia ([place]) 06/13/2013  . Influenza A with respiratory manifestations 05/10/2013  . Acute respiratory failure with hypoxia ([place]) 05/10/2013  . Dehydration 05/10/2013  . NSTEMI, initial episode of care ([place]) 05/09/2013  . Fever of undetermined  origin 05/09/2013  . Productive cough 05/09/2013  . Acute renal failure ([place]) 05/09/2013  . NSTEMI (non-ST elevated myocardial infarction) ([place]) 05/09/2013  . Pulmonary infiltrate in right lung on chest x-ray 05/09/2013  . History of right PCA stroke - right occipital 05/09/2013  . Hypertension   . High cholesterol   . PAD (peripheral artery disease) ([place])   . Diabetes mellitus, type 2 ([place])   . Infected sebaceous cyst 04/07/2013  . Occlusion and stenosis of  carotid artery without mention of cerebral infarction 10/23/2011   Past Medical History:  Diagnosis Date  . Anemia   . CHF (congestive heart failure) ([place]) 05/2013  . Degenerative disc disease, lumbar   . Diabetes mellitus, type 2 ([place])   . Flu Feb. 9, 2015   ?  Mild Heart Attack  . GERD (gastroesophageal reflux disease)   . H/O thoracic aortic aneurysm repair    Stent Graft (penetrating ulcer) - r/t staphylococcal infection  . High cholesterol   . History of tobacco use    50 pack year history, quit in 2002  . Hypertension   . Left carotid artery occlusion    moderate R carotid disease; L Common Carotid Bypassed during Arch Repair.  Marland Kitchen MRSA infection    s/p '02-no problems now  . Myocardial infarction Feb. 9, 2015   Mild Heart attack  . Osteoporosis, senile   . PAD (peripheral artery disease) ([facility])    s/p L Fem-Pop Bypass; R Iliac PTA  . Seasonal allergies   . Stroke ([place])   . Transfusion history    '02- back surgery    Family History  Problem Relation Age of Onset  . Diabetes Mother   . Hypertension Mother     heart disese, died of MI at 53  . Hyperlipidemia Mother   . Heart disease Father     CHF  . Heart attack Father   . Arthritis Father   . Diabetes Sister     also Alzheimers  . Heart disease Son     Heart Disease before age 72  . Marfan syndrome Daughter   . AAA (abdominal aortic aneurysm) Son     repair in 20s    Past Surgical History:  Procedure Laterality Date  . 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 BACK SURGERY  2001  . CLEFT LIP REPAIR    . COLONOSCOPY WITH PROPOFOL N/A 08/10/2012   Procedure: COLONOSCOPY WITH PROPOFOL;  Surgeon: Garlan Fair, MD;  Location: [facility];  Service: Endoscopy;  Laterality: N/A;  . ESOPHAGOGASTRODUODENOSCOPY (EGD) WITH PROPOFOL N/A 08/10/2012    Procedure: ESOPHAGOGASTRODUODENOSCOPY (EGD) WITH PROPOFOL;  Surgeon: Garlan Fair, MD;  Location: [facility];  Service: Endoscopy;  Laterality: N/A;  . HEMIARTHROPLASTY HIP Right   . ILIAC ARTERY STENT Right    PTA -STENT  . JOINT REPLACEMENT Right 2003   right hip   . PR VEIN BYPASS GRAFT,AORTO-FEM-POP  1999  . THORACIC AORTIC ENDOVASCULAR STENT GRAFT  2003   @ [facility]  - for Penetrating Ulcer/Aneurysm (cardiac cath done 01/08/2001 by Dr. Antionette Poles)  . TRANSTHORACIC ECHOCARDIOGRAM  10/2009   EF=>55%, LA mildly dilated, mild mitral annular calfication, mild MR, mild TR, RVSP 30-68mmHg, AV mildly sclerotic, trace pulm valve regurg  . VASCULAR SURGERY     Social History  Occupational History  . retired - Engineer, manufacturing systems    Social History Main Topics  . Smoking status: Former Smoker    Quit date: 09/24/2000  . Smokeless tobacco: Never Used  . Alcohol use No  . Drug use: No  . Sexual activity: Not Currently

## 2016-02-12 ENCOUNTER — Emergency Department (HOSPITAL_COMMUNITY): Payer: Medicare Other

## 2016-02-12 ENCOUNTER — Inpatient Hospital Stay (HOSPITAL_COMMUNITY)
Admission: EM | Admit: 2016-02-12 | Discharge: 2016-02-18 | DRG: 280 | Disposition: A | Discharge status: Hospice | Payer: Medicare Other | Attending: Internal Medicine | Admitting: Internal Medicine

## 2016-02-12 ENCOUNTER — Encounter (HOSPITAL_COMMUNITY): Payer: Self-pay | Admitting: Emergency Medicine

## 2016-02-12 DIAGNOSIS — I214 Non-ST elevation (NSTEMI) myocardial infarction: Secondary | ICD-10-CM | POA: Diagnosis present

## 2016-02-12 DIAGNOSIS — N184 Chronic kidney disease, stage 4 (severe): Secondary | ICD-10-CM | POA: Diagnosis present

## 2016-02-12 DIAGNOSIS — Z515 Encounter for palliative care: Secondary | ICD-10-CM | POA: Diagnosis present

## 2016-02-12 DIAGNOSIS — Z833 Family history of diabetes mellitus: Secondary | ICD-10-CM

## 2016-02-12 DIAGNOSIS — R7989 Other specified abnormal findings of blood chemistry: Secondary | ICD-10-CM

## 2016-02-12 DIAGNOSIS — I739 Peripheral vascular disease, unspecified: Secondary | ICD-10-CM | POA: Diagnosis not present

## 2016-02-12 DIAGNOSIS — R0602 Shortness of breath: Secondary | ICD-10-CM | POA: Diagnosis present

## 2016-02-12 DIAGNOSIS — E1122 Type 2 diabetes mellitus with diabetic chronic kidney disease: Secondary | ICD-10-CM | POA: Diagnosis present

## 2016-02-12 DIAGNOSIS — R000 Tachycardia, unspecified: Secondary | ICD-10-CM | POA: Diagnosis not present

## 2016-02-12 DIAGNOSIS — Z7951 Long term (current) use of inhaled steroids: Secondary | ICD-10-CM

## 2016-02-12 DIAGNOSIS — Z7982 Long term (current) use of aspirin: Secondary | ICD-10-CM

## 2016-02-12 DIAGNOSIS — Z8614 Personal history of Methicillin resistant Staphylococcus aureus infection: Secondary | ICD-10-CM

## 2016-02-12 DIAGNOSIS — Z79899 Other long term (current) drug therapy: Secondary | ICD-10-CM

## 2016-02-12 DIAGNOSIS — R778 Other specified abnormalities of plasma proteins: Secondary | ICD-10-CM | POA: Diagnosis present

## 2016-02-12 DIAGNOSIS — Z87891 Personal history of nicotine dependence: Secondary | ICD-10-CM

## 2016-02-12 DIAGNOSIS — N179 Acute kidney failure, unspecified: Secondary | ICD-10-CM | POA: Diagnosis present

## 2016-02-12 DIAGNOSIS — Z8673 Personal history of transient ischemic attack (TIA), and cerebral infarction without residual deficits: Secondary | ICD-10-CM

## 2016-02-12 DIAGNOSIS — M810 Age-related osteoporosis without current pathological fracture: Secondary | ICD-10-CM | POA: Diagnosis present

## 2016-02-12 DIAGNOSIS — E118 Type 2 diabetes mellitus with unspecified complications: Secondary | ICD-10-CM | POA: Diagnosis not present

## 2016-02-12 DIAGNOSIS — Z66 Do not resuscitate: Secondary | ICD-10-CM | POA: Diagnosis not present

## 2016-02-12 DIAGNOSIS — W1830XA Fall on same level, unspecified, initial encounter: Secondary | ICD-10-CM | POA: Diagnosis present

## 2016-02-12 DIAGNOSIS — K219 Gastro-esophageal reflux disease without esophagitis: Secondary | ICD-10-CM | POA: Diagnosis present

## 2016-02-12 DIAGNOSIS — I130 Hypertensive heart and chronic kidney disease with heart failure and stage 1 through stage 4 chronic kidney disease, or unspecified chronic kidney disease: Secondary | ICD-10-CM | POA: Diagnosis present

## 2016-02-12 DIAGNOSIS — E785 Hyperlipidemia, unspecified: Secondary | ICD-10-CM

## 2016-02-12 DIAGNOSIS — I5043 Acute on chronic combined systolic (congestive) and diastolic (congestive) heart failure: Secondary | ICD-10-CM | POA: Diagnosis present

## 2016-02-12 DIAGNOSIS — I2510 Atherosclerotic heart disease of native coronary artery without angina pectoris: Secondary | ICD-10-CM | POA: Diagnosis not present

## 2016-02-12 DIAGNOSIS — E1151 Type 2 diabetes mellitus with diabetic peripheral angiopathy without gangrene: Secondary | ICD-10-CM | POA: Diagnosis present

## 2016-02-12 DIAGNOSIS — I679 Cerebrovascular disease, unspecified: Secondary | ICD-10-CM | POA: Diagnosis present

## 2016-02-12 DIAGNOSIS — I509 Heart failure, unspecified: Secondary | ICD-10-CM | POA: Diagnosis not present

## 2016-02-12 DIAGNOSIS — E119 Type 2 diabetes mellitus without complications: Secondary | ICD-10-CM

## 2016-02-12 DIAGNOSIS — N183 Chronic kidney disease, stage 3 unspecified: Secondary | ICD-10-CM | POA: Diagnosis present

## 2016-02-12 DIAGNOSIS — R0902 Hypoxemia: Secondary | ICD-10-CM

## 2016-02-12 DIAGNOSIS — Z8679 Personal history of other diseases of the circulatory system: Secondary | ICD-10-CM

## 2016-02-12 DIAGNOSIS — Z8261 Family history of arthritis: Secondary | ICD-10-CM

## 2016-02-12 DIAGNOSIS — Z820 Family history of epilepsy and other diseases of the nervous system: Secondary | ICD-10-CM

## 2016-02-12 DIAGNOSIS — E7800 Pure hypercholesterolemia, unspecified: Secondary | ICD-10-CM | POA: Diagnosis present

## 2016-02-12 DIAGNOSIS — I10 Essential (primary) hypertension: Secondary | ICD-10-CM | POA: Diagnosis present

## 2016-02-12 DIAGNOSIS — Z87730 Personal history of (corrected) cleft lip and palate: Secondary | ICD-10-CM

## 2016-02-12 DIAGNOSIS — R1310 Dysphagia, unspecified: Secondary | ICD-10-CM | POA: Diagnosis present

## 2016-02-12 DIAGNOSIS — I272 Pulmonary hypertension, unspecified: Secondary | ICD-10-CM | POA: Diagnosis present

## 2016-02-12 DIAGNOSIS — E875 Hyperkalemia: Secondary | ICD-10-CM | POA: Diagnosis present

## 2016-02-12 DIAGNOSIS — E1169 Type 2 diabetes mellitus with other specified complication: Secondary | ICD-10-CM | POA: Diagnosis present

## 2016-02-12 DIAGNOSIS — Z8249 Family history of ischemic heart disease and other diseases of the circulatory system: Secondary | ICD-10-CM

## 2016-02-12 DIAGNOSIS — I25110 Atherosclerotic heart disease of native coronary artery with unstable angina pectoris: Secondary | ICD-10-CM | POA: Diagnosis not present

## 2016-02-12 DIAGNOSIS — F419 Anxiety disorder, unspecified: Secondary | ICD-10-CM | POA: Diagnosis present

## 2016-02-12 DIAGNOSIS — D649 Anemia, unspecified: Secondary | ICD-10-CM | POA: Diagnosis present

## 2016-02-12 DIAGNOSIS — J9601 Acute respiratory failure with hypoxia: Secondary | ICD-10-CM | POA: Diagnosis present

## 2016-02-12 DIAGNOSIS — I080 Rheumatic disorders of both mitral and aortic valves: Secondary | ICD-10-CM | POA: Diagnosis present

## 2016-02-12 DIAGNOSIS — K5909 Other constipation: Secondary | ICD-10-CM | POA: Diagnosis present

## 2016-02-12 DIAGNOSIS — Z885 Allergy status to narcotic agent status: Secondary | ICD-10-CM

## 2016-02-12 DIAGNOSIS — E876 Hypokalemia: Secondary | ICD-10-CM | POA: Diagnosis present

## 2016-02-12 DIAGNOSIS — R748 Abnormal levels of other serum enzymes: Secondary | ICD-10-CM | POA: Diagnosis not present

## 2016-02-12 DIAGNOSIS — S42211A Unspecified displaced fracture of surgical neck of right humerus, initial encounter for closed fracture: Secondary | ICD-10-CM | POA: Diagnosis present

## 2016-02-12 HISTORY — DX: Dyspnea, unspecified: R06.00

## 2016-02-12 LAB — CBC WITH DIFFERENTIAL/PLATELET
Basophils Absolute: 0 10*3/uL (ref 0.0–0.1)
Basophils Relative: 0 %
EOS ABS: 0.2 10*3/uL (ref 0.0–0.7)
EOS PCT: 3 %
HCT: 29.8 % — ABNORMAL LOW (ref 39.0–52.0)
Hemoglobin: 10.1 g/dL — ABNORMAL LOW (ref 13.0–17.0)
LYMPHS ABS: 0.9 10*3/uL (ref 0.7–4.0)
LYMPHS PCT: 13 %
MCH: 33.1 pg (ref 26.0–34.0)
MCHC: 33.9 g/dL (ref 30.0–36.0)
MCV: 97.7 fL (ref 78.0–100.0)
MONO ABS: 0.5 10*3/uL (ref 0.1–1.0)
MONOS PCT: 8 %
Neutro Abs: 5.1 10*3/uL (ref 1.7–7.7)
Neutrophils Relative %: 76 %
PLATELETS: 139 10*3/uL — AB (ref 150–400)
RBC: 3.05 MIL/uL — ABNORMAL LOW (ref 4.22–5.81)
RDW: 13.8 % (ref 11.5–15.5)
WBC: 6.7 10*3/uL (ref 4.0–10.5)

## 2016-02-12 LAB — COMPREHENSIVE METABOLIC PANEL
ALBUMIN: 3.5 g/dL (ref 3.5–5.0)
ALT: 13 U/L — AB (ref 17–63)
ANION GAP: 11 (ref 5–15)
AST: 21 U/L (ref 15–41)
Alkaline Phosphatase: 200 U/L — ABNORMAL HIGH (ref 38–126)
BUN: 50 mg/dL — ABNORMAL HIGH (ref 6–20)
CHLORIDE: 101 mmol/L (ref 101–111)
CO2: 22 mmol/L (ref 22–32)
Calcium: 9.3 mg/dL (ref 8.9–10.3)
Creatinine, Ser: 1.91 mg/dL — ABNORMAL HIGH (ref 0.61–1.24)
GFR calc non Af Amer: 31 mL/min — ABNORMAL LOW (ref >60)
GFR, EST AFRICAN AMERICAN: 36 mL/min — AB (ref >60)
GLUCOSE: 188 mg/dL — AB (ref 65–99)
Potassium: 4.4 mmol/L (ref 3.5–5.1)
SODIUM: 134 mmol/L — AB (ref 135–145)
Total Bilirubin: 0.7 mg/dL (ref 0.3–1.2)
Total Protein: 6.6 g/dL (ref 6.5–8.1)

## 2016-02-12 LAB — BRAIN NATRIURETIC PEPTIDE: B Natriuretic Peptide: 887.3 pg/mL — ABNORMAL HIGH (ref 0.0–100.0)

## 2016-02-12 LAB — TROPONIN I: Troponin I: 0.81 ng/mL (ref <0.03)

## 2016-02-12 MED ORDER — ONDANSETRON HCL 4 MG/2ML IJ SOLN: 4.0000 mg | Freq: Four times a day (QID) | INTRAMUSCULAR | Status: DC | PRN

## 2016-02-12 MED ORDER — ATORVASTATIN CALCIUM 20 MG PO TABS
20.0000 mg | ORAL_TABLET | Freq: Every evening | ORAL | Status: DC
  Administered 2016-02-13 – 2016-02-17 (×6): 20 mg via ORAL
  Filled 2016-02-12: qty 1
  Filled 2016-02-12 (×2): qty 2
  Filled 2016-02-12 (×2): qty 1
  Filled 2016-02-12: qty 2

## 2016-02-12 MED ORDER — MULTIVITAMINS PO CAPS: 1.0000 | ORAL_CAPSULE | Freq: Every day | ORAL | Status: DC

## 2016-02-12 MED ORDER — FERROUS GLUCONATE 324 (38 FE) MG PO TABS
324.0000 mg | ORAL_TABLET | Freq: Every day | ORAL | Status: DC
  Administered 2016-02-13 – 2016-02-18 (×5): 324 mg via ORAL
  Filled 2016-02-12 (×6): qty 1

## 2016-02-12 MED ORDER — NIACIN ER 500 MG PO TBCR
500.0000 mg | EXTENDED_RELEASE_TABLET | Freq: Every day | ORAL | Status: DC
  Administered 2016-02-13 – 2016-02-18 (×5): 500 mg via ORAL
  Filled 2016-02-12 (×6): qty 1

## 2016-02-12 MED ORDER — FLUTICASONE PROPIONATE 50 MCG/ACT NA SUSP
1.0000 | Freq: Two times a day (BID) | NASAL | Status: DC
  Administered 2016-02-13 – 2016-02-18 (×11): 1 via NASAL
  Filled 2016-02-12 (×3): qty 16

## 2016-02-12 MED ORDER — ENOXAPARIN SODIUM 40 MG/0.4ML ~~LOC~~ SOLN: 40.0000 mg | SUBCUTANEOUS | Status: DC

## 2016-02-12 MED ORDER — ASPIRIN EC 81 MG PO TBEC
81.0000 mg | DELAYED_RELEASE_TABLET | Freq: Every morning | ORAL | Status: DC
  Administered 2016-02-13 – 2016-02-18 (×6): 81 mg via ORAL
  Filled 2016-02-12 (×6): qty 1

## 2016-02-12 MED ORDER — SODIUM CHLORIDE 0.9 % IV SOLN: 250.0000 mL | INTRAVENOUS | Status: DC | PRN

## 2016-02-12 MED ORDER — HYDROCODONE-ACETAMINOPHEN 5-325 MG PO TABS
1.0000 | ORAL_TABLET | Freq: Four times a day (QID) | ORAL | Status: DC | PRN
  Administered 2016-02-12 – 2016-02-17 (×4): 1 via ORAL
  Filled 2016-02-12 (×4): qty 1

## 2016-02-12 MED ORDER — SODIUM CHLORIDE 0.9% FLUSH
3.0000 mL | Freq: Two times a day (BID) | INTRAVENOUS | Status: DC
  Administered 2016-02-12 – 2016-02-18 (×9): 3 mL via INTRAVENOUS

## 2016-02-12 MED ORDER — FUROSEMIDE 10 MG/ML IJ SOLN
40.0000 mg | Freq: Once | INTRAMUSCULAR | Status: AC
  Administered 2016-02-12: 40 mg via INTRAVENOUS
  Filled 2016-02-12: qty 4

## 2016-02-12 MED ORDER — SODIUM CHLORIDE 0.9% FLUSH: 3.0000 mL | INTRAVENOUS | Status: DC | PRN

## 2016-02-12 MED ORDER — POLYETHYLENE GLYCOL 3350 17 G PO PACK
17.0000 g | PACK | Freq: Every day | ORAL | Status: DC | PRN
Start: 2016-02-12 — End: 2016-02-18

## 2016-02-12 MED ORDER — DILTIAZEM HCL ER COATED BEADS 120 MG PO CP24
240.0000 mg | ORAL_CAPSULE | ORAL | Status: DC
  Administered 2016-02-13 – 2016-02-14 (×2): 240 mg via ORAL
  Filled 2016-02-12 (×2): qty 2

## 2016-02-12 MED ORDER — ISOSORBIDE DINITRATE 10 MG PO TABS
10.0000 mg | ORAL_TABLET | Freq: Three times a day (TID) | ORAL | Status: DC
  Administered 2016-02-13 – 2016-02-18 (×17): 10 mg via ORAL
  Filled 2016-02-12 (×17): qty 1

## 2016-02-12 MED ORDER — INSULIN ASPART 100 UNIT/ML ~~LOC~~ SOLN
0.0000 [IU] | Freq: Three times a day (TID) | SUBCUTANEOUS | Status: DC
  Administered 2016-02-13: 3 [IU] via SUBCUTANEOUS
  Administered 2016-02-13 – 2016-02-16 (×6): 2 [IU] via SUBCUTANEOUS
  Administered 2016-02-16: 3 [IU] via SUBCUTANEOUS
  Administered 2016-02-16: 2 [IU] via SUBCUTANEOUS
  Administered 2016-02-17 (×3): 5 [IU] via SUBCUTANEOUS
  Administered 2016-02-18 (×2): 3 [IU] via SUBCUTANEOUS

## 2016-02-12 MED ORDER — INSULIN ASPART 100 UNIT/ML ~~LOC~~ SOLN
0.0000 [IU] | Freq: Every day | SUBCUTANEOUS | Status: DC
  Administered 2016-02-14: 2 [IU] via SUBCUTANEOUS
  Administered 2016-02-17: 3 [IU] via SUBCUTANEOUS

## 2016-02-12 MED ORDER — ALBUTEROL SULFATE (2.5 MG/3ML) 0.083% IN NEBU
5.0000 mg | INHALATION_SOLUTION | Freq: Once | RESPIRATORY_TRACT | Status: AC
  Administered 2016-02-12: 5 mg via RESPIRATORY_TRACT
  Filled 2016-02-12: qty 6

## 2016-02-12 MED ORDER — ACETAMINOPHEN 325 MG PO TABS
650.0000 mg | ORAL_TABLET | ORAL | Status: DC | PRN
  Administered 2016-02-17: 650 mg via ORAL
  Filled 2016-02-12: qty 2

## 2016-02-12 MED ORDER — DOXAZOSIN MESYLATE 2 MG PO TABS
2.0000 mg | ORAL_TABLET | Freq: Every day | ORAL | Status: DC
  Administered 2016-02-13 – 2016-02-18 (×6): 2 mg via ORAL
  Filled 2016-02-12 (×8): qty 1

## 2016-02-12 MED ORDER — VITAMIN D 1000 UNITS PO TABS
2000.0000 [IU] | ORAL_TABLET | Freq: Every day | ORAL | Status: DC
  Administered 2016-02-13 – 2016-02-18 (×5): 2000 [IU] via ORAL
  Filled 2016-02-12 (×5): qty 2

## 2016-02-12 MED ORDER — LORATADINE 10 MG PO TABS
10.0000 mg | ORAL_TABLET | Freq: Every day | ORAL | Status: DC
  Administered 2016-02-13 – 2016-02-18 (×6): 10 mg via ORAL
  Filled 2016-02-12 (×7): qty 1

## 2016-02-12 MED ORDER — DIPHENHYDRAMINE HCL 25 MG PO CAPS
25.0000 mg | ORAL_CAPSULE | Freq: Every day | ORAL | Status: DC
  Administered 2016-02-13 – 2016-02-17 (×6): 25 mg via ORAL
  Filled 2016-02-12 (×7): qty 1

## 2016-02-12 MED ORDER — SODIUM FLUORIDE 1.1 % DT PSTE: 1.0000 "application " | PASTE | Freq: Every evening | DENTAL | Status: DC

## 2016-02-12 MED ORDER — LEVALBUTEROL HCL 1.25 MG/0.5ML IN NEBU
1.2500 mg | INHALATION_SOLUTION | Freq: Four times a day (QID) | RESPIRATORY_TRACT | Status: DC
  Administered 2016-02-12: 1.25 mg via RESPIRATORY_TRACT
  Filled 2016-02-12 (×3): qty 0.5

## 2016-02-12 MED ORDER — ALPRAZOLAM 0.25 MG PO TABS: 0.2500 mg | ORAL_TABLET | Freq: Two times a day (BID) | ORAL | Status: DC | PRN

## 2016-02-12 MED ORDER — TEMAZEPAM 15 MG PO CAPS
30.0000 mg | ORAL_CAPSULE | Freq: Every day | ORAL | Status: DC
  Administered 2016-02-13 – 2016-02-17 (×6): 30 mg via ORAL
  Filled 2016-02-12 (×6): qty 2

## 2016-02-12 MED ORDER — LEVALBUTEROL HCL 1.25 MG/0.5ML IN NEBU: 1.2500 mg | INHALATION_SOLUTION | Freq: Four times a day (QID) | RESPIRATORY_TRACT | Status: DC

## 2016-02-12 MED ORDER — PANTOPRAZOLE SODIUM 40 MG PO TBEC
40.0000 mg | DELAYED_RELEASE_TABLET | Freq: Every day | ORAL | Status: DC
  Administered 2016-02-13 – 2016-02-18 (×6): 40 mg via ORAL
  Filled 2016-02-12 (×6): qty 1

## 2016-02-12 MED ORDER — FUROSEMIDE 10 MG/ML IJ SOLN
40.0000 mg | Freq: Two times a day (BID) | INTRAMUSCULAR | Status: DC
  Administered 2016-02-13 – 2016-02-14 (×5): 40 mg via INTRAVENOUS
  Filled 2016-02-12 (×5): qty 4

## 2016-02-12 MED ORDER — CARVEDILOL 12.5 MG PO TABS
6.2500 mg | ORAL_TABLET | Freq: Two times a day (BID) | ORAL | Status: DC
  Administered 2016-02-13 – 2016-02-14 (×3): 6.25 mg via ORAL
  Filled 2016-02-12 (×3): qty 1

## 2016-02-12 MED ORDER — OMEGA-3-ACID ETHYL ESTERS 1 G PO CAPS
1.0000 g | ORAL_CAPSULE | Freq: Every day | ORAL | Status: DC
  Administered 2016-02-13 – 2016-02-18 (×6): 1 g via ORAL
  Filled 2016-02-12 (×6): qty 1

## 2016-02-12 NOTE — ED Notes (Signed)
Attempted report x1.  Name and callback number provided.   

## 2016-02-12 NOTE — ED Notes (Signed)
Condom cath applied

## 2016-02-12 NOTE — ED Notes (Signed)
Lab called critical troponin of 0.81.

## 2016-02-12 NOTE — ED Provider Notes (Signed)
[facility] DEPT Provider Note   CSN: [id number] Arrival date & time:        History   Chief Complaint Chief Complaint  Patient presents with  . Shortness of Breath    HPI [name] is a 82 y.o. male.  HPI  Patient presents with concern of dyspnea. Symptoms began a few days ago, became worse over the past 2 days. Patient acknowledges history of CHF for which she takes Lasix, typically 20 mg, every other day. Over the past 2 days he has taken 40 mg daily, but has worsening symptoms. No new pain anywhere, no fever, no cough. He also describes new dyspnea on exertion, and fatigue well beyond baseline. Patient has recent notable history of fall, with right humerus fracture. He states that this is not currently a substantial issue.   Past Medical History:  Diagnosis Date  . Anemia   . CHF (congestive heart failure) ([place]) 05/2013  . Degenerative disc disease, lumbar   . Diabetes mellitus, type 2 ([place])   . Flu Feb. 9, 2015   ?  Mild Heart Attack  . GERD (gastroesophageal reflux disease)   . H/O thoracic aortic aneurysm repair    Stent Graft (penetrating ulcer) - r/t staphylococcal infection  . High cholesterol   . History of tobacco use    50 pack year history, quit in 2002  . Hypertension   . Left carotid artery occlusion    moderate R carotid disease; L Common Carotid Bypassed during Arch Repair.  Marland Kitchen MRSA infection    s/p '02-no problems now  . Myocardial infarction Feb. 9, 2015   Mild Heart attack  . Osteoporosis, senile   . PAD (peripheral artery disease) ([facility])    s/p L Fem-Pop Bypass; R Iliac PTA  . Seasonal allergies   . Stroke ([place])   . Transfusion history    '02- back surgery    Patient Active Problem List   Diagnosis Date Noted  . Chronic diastolic congestive heart failure ([place]) 01/02/2016  . Blurred vision 11/16/2015  . Dizziness and giddiness 11/16/2015  . Hyperkalemia 11/15/2015  . Bilateral carotid artery occlusion 10/29/2015  .  Peripheral vascular disease, unspecified 08/18/2013  . Chronic systolic CHF (congestive heart failure) ([place]) 07/01/2013  . Pulmonary edema 06/13/2013  . Acute decompensated heart failure ([place]) 06/13/2013  . Thrombocytopenia ([place]) 06/13/2013  . Influenza A with respiratory manifestations 05/10/2013  . Acute respiratory failure with hypoxia ([place]) 05/10/2013  . Dehydration 05/10/2013  . NSTEMI, initial episode of care ([place]) 05/09/2013  . Fever of undetermined origin 05/09/2013  . Productive cough 05/09/2013  . Acute renal failure ([place]) 05/09/2013  . NSTEMI (non-ST elevated myocardial infarction) ([place]) 05/09/2013  . Pulmonary infiltrate in right lung on chest x-ray 05/09/2013  . History of right PCA stroke - right occipital 05/09/2013  . Hypertension   . High cholesterol   . PAD (peripheral artery disease) ([place])   . Diabetes mellitus, type 2 ([place])   . Infected sebaceous cyst 04/07/2013  . Occlusion and stenosis of carotid artery without mention of cerebral infarction 10/23/2011    Past Surgical History:  Procedure Laterality Date  . 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 BACK SURGERY  2001  . CLEFT LIP REPAIR    . COLONOSCOPY WITH PROPOFOL N/A 08/10/2012   Procedure: COLONOSCOPY  WITH PROPOFOL;  Surgeon: Garlan Fair, MD;  Location: [facility];  Service: Endoscopy;  Laterality: N/A;  . ESOPHAGOGASTRODUODENOSCOPY (EGD) WITH PROPOFOL N/A 08/10/2012   Procedure: ESOPHAGOGASTRODUODENOSCOPY (EGD) WITH PROPOFOL;  Surgeon: Garlan Fair, MD;  Location[facility];  Service: Endoscopy;  Laterality: N/A;  . HEMIARTHROPLASTY HIP Right   . ILIAC ARTERY STENT Right    PTA -STENT  . JOINT REPLACEMENT Right 2003   right hip   . PR VEIN BYPASS GRAFT,AORTO-FEM-POP  1999  . THORACIC AORTIC ENDOVASCULAR STENT GRAFT  2003   @ [facility]   - for Penetrating Ulcer/Aneurysm (cardiac cath done 01/08/2001 by Dr. Antionette Poles)  . TRANSTHORACIC ECHOCARDIOGRAM  10/2009   EF=>55%, LA mildly dilated, mild mitral annular calfication, mild MR, mild TR, RVSP 30-74mmHg, AV mildly sclerotic, trace pulm valve regurg  . VASCULAR SURGERY         Home Medications    Prior to Admission medications   Medication Sig Start Date End Date Taking? Authorizing Provider  amoxicillin (AMOXIL) 500 MG capsule Take 2,000 mg by mouth as needed (1 hour before dental appointments).  03/02/15   Historical Provider, MD  aspirin EC 81 MG tablet Take 81 mg by mouth every morning.     Historical Provider, MD  atorvastatin (LIPITOR) 40 MG tablet Take 20 mg by mouth every evening.     Historical Provider, MD  CARTIA XT 240 MG 24 hr capsule Take 240 mg by mouth every morning. 09/02/15   Historical Provider, MD  carvedilol (COREG) 6.25 MG tablet Take 6.25 mg by mouth 2 (two) times daily with a meal.    Historical Provider, MD  Cholecalciferol (VITAMIN D) 2000 UNITS tablet Take 2,000 Units by mouth daily.    Historical Provider, MD  cyclobenzaprine (FLEXERIL) 5 MG tablet TK 1 T PO TID PRF [facility] 02/01/16   Historical Provider, MD  diphenhydrAMINE (BENADRYL) 25 MG tablet Take 25 mg by mouth at bedtime.    Historical Provider, MD  doxazosin (CARDURA) 2 MG tablet Take 2 mg by mouth daily.  06/04/13   Historical Provider, MD  Ferrous Gluconate (IRON) 246 (28 FE) MG TABS Take 2 tablets by mouth daily.    Historical Provider, MD  fluticasone (FLONASE) 50 MCG/ACT nasal spray Place 1 spray into the nose 2 (two) times daily.     Historical Provider, MD  furosemide (LASIX) 20 MG tablet Take 20 mg by mouth every other day. Take 1 tab every other day depending on weight gain 02/22/15   Historical Provider, MD  glimepiride (AMARYL) 2 MG tablet Take 0.25 mg by mouth daily as needed (for blood sugar over 135 (1/8 of a 2 mg tablet)).     Historical Provider, MD  HYDROcodone-acetaminophen  (NORCO/VICODIN) 5-325 MG tablet Take 1 tablet by mouth every 6 (six) hours. 01/25/16   Dorie Rank, MD  isosorbide dinitrate (ISORDIL) 10 MG tablet Take 1 tablet (10 mg total) by mouth 3 (three) times daily. 05/16/13   Erline Hau, MD  loratadine (CLARITIN) 10 MG tablet Take 10 mg by mouth daily.    Historical Provider, MD  Multiple Vitamin (MULTIVITAMIN) capsule Take 1 capsule by mouth daily.    Historical Provider, MD  niacin (SLO-NIACIN) 500 MG tablet Take 500 mg by mouth daily.     Historical Provider, MD  Omega-3 Fatty Acids (FISH OIL) 1200 MG CAPS Take 4,800 mg by mouth every morning.     Historical Provider, MD  omeprazole (PRILOSEC) 20 MG capsule Take 20  mg by mouth daily.    Historical Provider, MD  ONE TOUCH ULTRA TEST test strip TEST BLOOD SUGAR ONCE DAILY 11/26/15   Historical Provider, MD  [place] TEST BLOOD SUGAR ONCE DAILY 01/18/16   Historical Provider, MD  polyethylene glycol (MIRALAX / GLYCOLAX) packet Take 17 g by mouth every evening.     Historical Provider, MD  PREVIDENT 5000 BOOSTER PLUS 1.1 % PSTE Take 1 application by mouth every evening.  Use once in place of toothpaste brush 2 minutes then spit. DO NOT RINSE 04/03/15   Historical Provider, MD  temazepam (RESTORIL) 30 MG capsule Take 30 mg by mouth at bedtime. For sleep    Historical Provider, MD    Family History Family History  Problem Relation Age of Onset  . Diabetes Mother   . Hypertension Mother     heart disese, died of MI at 83  . Hyperlipidemia Mother   . Heart disease Father     CHF  . Heart attack Father   . Arthritis Father   . Diabetes Sister     also Alzheimers  . Heart disease Son     Heart Disease before age 76  . Marfan syndrome Daughter   . AAA (abdominal aortic aneurysm) Son     repair in 78s    Social History Social History  Substance Use Topics  . Smoking status: Former Smoker    Quit date: 09/24/2000  . Smokeless tobacco: Never Used  . Alcohol use  No     Allergies   Codeine   Review of Systems Review of Systems  Constitutional:       Per HPI, otherwise negative  HENT:       Per HPI, otherwise negative  Respiratory:       Per HPI, otherwise negative  Cardiovascular:       Per HPI, otherwise negative  Gastrointestinal: Negative for vomiting.  Endocrine:       Negative aside from HPI  Genitourinary:       Neg aside from HPI   Musculoskeletal:       Per HPI, otherwise negative  Skin: Positive for color change.  Neurological: Positive for weakness. Negative for syncope.  Hematological: Bruises/bleeds easily.     Physical Exam Updated Vital Signs BP 137/66 (BP Location: Left Arm)   Pulse 117   Temp 98.5 F (36.9 C) (Oral)   Resp 19   SpO2 96%   Physical Exam  Constitutional: He is oriented to person, place, and time.  Sickly appearing elderly M, awake, alert, oriented x3  HENT:  Head: Normocephalic and atraumatic.  Eyes: Conjunctivae and EOM are normal.  Cardiovascular: Normal rate and regular rhythm.   Pulmonary/Chest: Accessory muscle usage present. No stridor. Tachypnea noted. He is in respiratory distress. He has decreased breath sounds. He has wheezes.  Abdominal: He exhibits no distension.  Musculoskeletal: He exhibits no edema.       Right shoulder: He exhibits decreased range of motion, tenderness and bony tenderness.       Arms: Neurological: He is alert and oriented to person, place, and time.  Skin: Skin is warm and dry.  Psychiatric: He has a normal mood and affect.  Nursing note and vitals reviewed.    ED Treatments / Results  Labs (all labs ordered are listed, but only abnormal results are displayed) Labs Reviewed  COMPREHENSIVE METABOLIC PANEL - Abnormal; Notable for the following:       Result Value  Sodium 134 (*)    Glucose, Bld 188 (*)    BUN 50 (*)    Creatinine, Ser 1.91 (*)    ALT 13 (*)    Alkaline Phosphatase 200 (*)    GFR calc non Af Amer 31 (*)    GFR calc Af Amer 36  (*)    All other components within normal limits  BRAIN NATRIURETIC PEPTIDE - Abnormal; Notable for the following:    B Natriuretic Peptide 887.3 (*)    All other components within normal limits  TROPONIN I - Abnormal; Notable for the following:    Troponin I 0.81 (*)    All other components within normal limits  CBC WITH DIFFERENTIAL/PLATELET - Abnormal; Notable for the following:    RBC 3.05 (*)    Hemoglobin 10.1 (*)    HCT 29.8 (*)    Platelets 139 (*)    All other components within normal limits    EKG  EKG Interpretation  Date/Time:  Tuesday February 12 2016 20:07:12 EST Ventricular Rate:  122 PR Interval:    QRS Duration: 100 QT Interval:  337 QTC Calculation: 481 R Axis:   95 Text Interpretation:  Sinus tachycardia Prolonged PR interval ST-t wave abnormality Artifact Abnormal ekg Confirmed by Carmin Muskrat  MD ([id number]) on 02/12/2016 8:21:35 PM       O2- 99% [place], abn   Radiology Dg Chest Port 1 View  Result Date: 02/12/2016 CLINICAL DATA:  Shortness of Breath EXAM: PORTABLE CHEST 1 VIEW COMPARISON:  Chest radiograph June 13, 2013 and chest CT June 14, 2014 FINDINGS: There is interstitial edema with small bilateral pleural effusions. There is mild patchy alveolar opacity in the right lower lobe region. Heart is borderline prominent with mild pulmonary venous hypertension. No adenopathy. The patient has an aortic stent in place. Atherosclerotic calcification is also noted in the aorta. There is advanced arthropathy in the right shoulder with bony remodeling, stable. IMPRESSION: Evidence of a degree of congestive heart failure. Question alveolar edema versus superimposed pneumonia right base. Stable cardiac silhouette. Stent in descending thoracic aorta. Aortic atherosclerosis noted. Electronically Signed   By: Lowella Grip III M.D.   On: 02/12/2016 20:59    Procedures Procedures (including critical care time)  Medications Ordered in ED Medications  albuterol  (PROVENTIL) (2.5 MG/3ML) 0.083% nebulizer solution 5 mg (5 mg Nebulization Given 02/12/16 2029)  furosemide (LASIX) injection 40 mg (40 mg Intravenous Given 02/12/16 2033)     Initial Impression / Assessment and Plan / ED Course  I have reviewed the triage vital signs and the nursing notes.  Pertinent labs & imaging results that were available during my care of the patient were reviewed by me and considered in my medical decision making (see chart for details).  Clinical Course     10:02 PM After initial neb treatment the patient has increased rhonchi, remains today, with increased work of breathing. Initial labs notable for elevated BNP, troponin. Patient starting BiPAP.  I discussed patient's case with our cardiology colleagues.   Final Clinical Impressions(s) / ED Diagnoses  Acute exacerbation of congestive heart failure.  This elderly male with CHF presents several days working dyspnea. On exam the patient has tachypnea, tachycardia, increased work of breathing. Patient is found to have elevated troponin, BNP, both concerning for worsening congestive heart failure. Patient had minimal improvement after initial albuterol, and after initiation of BiPAP therapy, required admission to the stepdown unit.  CRITICAL CARE Performed by: Carmin Muskrat Total critical care time:  40 minutes Critical care time was exclusive of separately billable procedures and treating other patients. Critical care was necessary to treat or prevent imminent or life-threatening deterioration. Critical care was time spent personally by me on the following activities: development of treatment plan with patient and/or surrogate as well as nursing, discussions with consultants, evaluation of patient's response to treatment, examination of patient, obtaining history from patient or surrogate, ordering and performing treatments and interventions, ordering and review of laboratory studies, ordering and review of  radiographic studies, pulse oximetry and re-evaluation of patient's condition.     Carmin Muskrat, MD 02/12/16 [phone]

## 2016-02-12 NOTE — H&P (Signed)
History and Physical    [name] [id number] DOB: [birth year] DOA: 02/12/2016  Referring MD/NP/PA:   PCP: [name], MD   Patient coming from:  The patient is coming from home.  At baseline, pt is independent for most of ADL.   Chief Complaint: Shortness of breath  HPI: [name] is a 82 y.o. male with medical history significant of chronic combined systolic and diastolic CHF, hypertension, hyperlipidemia, diabetes mellitus, GERD, stroke, PAD, CAD, formal smoker, anemia, CKD-III, who presents with shortness of breath.  Patient states that she has been having shortness of breath for a week, which has been progressively getting worse. It is aggravated by exertion. Patient was on Lasix 20 mg every other days, which was increased to 40 mg daily by his PCP without significant help. Patient has mild dry cough, but no chest pain fever or chills. She does not have nausea, vomiting, abdominal pain, diarrhea, symptoms of UTI. No new unilateral weakness, vision change or hearing loss. He has bilateral leg edema. Of note, pt has right upper fracture wearing splint, has mild pain.  ED Course: pt was found to have BNP 887.3, troponin 0.81, WBC 6.7, worsening renal function, temperature normal, tachycardia, tachypnea, oxygen saturation 91% on room air which improved to 93% on BiPAP. Chest x-ray is consistent with CHF. Patient is admitted to stepdown as inpatient. BiPAP was started in ED.  Review of Systems:   General: no fevers, chills, has poor appetite, has fatigue HEENT: no blurry vision, hearing changes or sore throat Respiratory: has dyspnea, coughing, no wheezing CV: no chest pain, no palpitations GI: no nausea, vomiting, abdominal pain, diarrhea, constipation GU: no dysuria, burning on urination, increased urinary frequency, hematuria  Ext: has leg edema Neuro: no unilateral weakness, numbness, or tingling, no vision change or hearing loss Skin: no skin tear. Has bruits over  right upper arm MSK: has right upper fracture wearing splint, has mild pain. Heme: No easy bruising.  Travel history: No recent long distant travel.  Allergy:  Allergies  Allergen Reactions  . Codeine Nausea And Vomiting    Past Medical History:  Diagnosis Date  . Anemia   . CHF (congestive heart failure) ([place]) 05/2013  . Degenerative disc disease, lumbar   . Diabetes mellitus, type 2 ([place])   . Flu Feb. 9, 2015   ?  Mild Heart Attack  . GERD (gastroesophageal reflux disease)   . H/O thoracic aortic aneurysm repair    Stent Graft (penetrating ulcer) - r/t staphylococcal infection  . High cholesterol   . History of tobacco use    50 pack year history, quit in 2002  . Hypertension   . Left carotid artery occlusion    moderate R carotid disease; L Common Carotid Bypassed during Arch Repair.  Marland Kitchen MRSA infection    s/p '02-no problems now  . Myocardial infarction Feb. 9, 2015   Mild Heart attack  . Osteoporosis, senile   . PAD (peripheral artery disease) ([facility])    s/p L Fem-Pop Bypass; R Iliac PTA  . Seasonal allergies   . Stroke ([place])   . Transfusion history    '02- back surgery    Past Surgical History:  Procedure Laterality Date  . 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 BACK SURGERY  2001  . CLEFT LIP REPAIR    .  COLONOSCOPY WITH PROPOFOL N/A 08/10/2012   Procedure: COLONOSCOPY WITH PROPOFOL;  Surgeon: Garlan Fair, MD;  Location: [facility];  Service: Endoscopy;  Laterality: N/A;  . ESOPHAGOGASTRODUODENOSCOPY (EGD) WITH PROPOFOL N/A 08/10/2012   Procedure: ESOPHAGOGASTRODUODENOSCOPY (EGD) WITH PROPOFOL;  Surgeon: Garlan Fair, MD;  Location: [facility];  Service: Endoscopy;  Laterality: N/A;  . HEMIARTHROPLASTY HIP Right   . ILIAC ARTERY STENT Right    PTA -STENT  . JOINT REPLACEMENT Right 2003    right hip   . PR VEIN BYPASS GRAFT,AORTO-FEM-POP  1999  . THORACIC AORTIC ENDOVASCULAR STENT GRAFT  2003   @ [facility]  - for Penetrating Ulcer/Aneurysm (cardiac cath done 01/08/2001 by Dr. Antionette Poles)  . TRANSTHORACIC ECHOCARDIOGRAM  10/2009   EF=>55%, LA mildly dilated, mild mitral annular calfication, mild MR, mild TR, RVSP 30-76mmHg, AV mildly sclerotic, trace pulm valve regurg  . VASCULAR SURGERY      Social History:  reports that he quit smoking about 15 years ago. He has never used smokeless tobacco. He reports that he does not drink alcohol or use drugs.  Family History:  Family History  Problem Relation Age of Onset  . Diabetes Mother   . Hypertension Mother     heart disese, died of MI at 23  . Hyperlipidemia Mother   . Heart disease Father     CHF  . Heart attack Father   . Arthritis Father   . Diabetes Sister     also Alzheimers  . Heart disease Son     Heart Disease before age 16  . Marfan syndrome Daughter   . AAA (abdominal aortic aneurysm) Son     repair in 20s     Prior to Admission medications   Medication Sig Start Date End Date Taking? Authorizing Provider  amoxicillin (AMOXIL) 500 MG capsule Take 2,000 mg by mouth as needed (1 hour before dental appointments).  03/02/15  Yes Historical Provider, MD  aspirin EC 81 MG tablet Take 81 mg by mouth every morning.    Yes Historical Provider, MD  atorvastatin (LIPITOR) 40 MG tablet Take 20 mg by mouth every evening.    Yes Historical Provider, MD  CARTIA XT 240 MG 24 hr capsule Take 240 mg by mouth every morning. 09/02/15  Yes Historical Provider, MD  carvedilol (COREG) 6.25 MG tablet Take 6.25 mg by mouth 2 (two) times daily with a meal.   Yes Historical Provider, MD  Cholecalciferol (VITAMIN D) 2000 UNITS tablet Take 2,000 Units by mouth daily.   Yes Historical Provider, MD  diphenhydrAMINE (BENADRYL) 25 MG tablet Take 25 mg by mouth at bedtime.   Yes Historical Provider, MD  doxazosin (CARDURA) 2 MG tablet Take 2 mg by  mouth daily.  06/04/13  Yes Historical Provider, MD  Ferrous Gluconate (IRON) 246 (28 FE) MG TABS Take 2 tablets by mouth daily.   Yes Historical Provider, MD  fluticasone (FLONASE) 50 MCG/ACT nasal spray Place 1 spray into the nose 2 (two) times daily.    Yes Historical Provider, MD  furosemide (LASIX) 20 MG tablet Take 20 mg by mouth every other day. Take 1 tab every other day depending on weight gain 02/22/15  Yes Historical Provider, MD  glimepiride (AMARYL) 2 MG tablet Take 1 mg by mouth daily as needed (for blood sugar over 135 (1/8 of a 2 mg tablet)).    Yes Historical Provider, MD  isosorbide dinitrate (ISORDIL) 10 MG tablet Take 1 tablet (10 mg total) by mouth 3 (  three) times daily. 05/16/13  Yes Erline Hau, MD  loratadine (CLARITIN) 10 MG tablet Take 10 mg by mouth daily.   Yes Historical Provider, MD  Multiple Vitamin (MULTIVITAMIN) capsule Take 1 capsule by mouth daily.   Yes Historical Provider, MD  niacin (SLO-NIACIN) 500 MG tablet Take 500 mg by mouth daily.    Yes Historical Provider, MD  Omega-3 Fatty Acids (FISH OIL) 1200 MG CAPS Take 4,800 mg by mouth every morning.    Yes Historical Provider, MD  omeprazole (PRILOSEC) 20 MG capsule Take 20 mg by mouth daily.   Yes Historical Provider, MD  polyethylene glycol (MIRALAX / GLYCOLAX) packet Take 17 g by mouth every evening.    Yes Historical Provider, MD  PREVIDENT 5000 BOOSTER PLUS 1.1 % PSTE Take 1 application by mouth every evening.  Use once in place of toothpaste brush 2 minutes then spit. DO NOT RINSE 04/03/15  Yes Historical Provider, MD  temazepam (RESTORIL) 30 MG capsule Take 30 mg by mouth at bedtime. For sleep   Yes Historical Provider, MD  HYDROcodone-acetaminophen (NORCO/VICODIN) 5-325 MG tablet Take 1 tablet by mouth every 6 (six) hours. Patient not taking: Reported on 02/12/2016 01/25/16   Dorie Rank, MD    Physical Exam: Vitals:   02/12/16 1945 02/12/16 2030 02/12/16 2115 02/12/16 2200  BP: 137/66 161/92  144/82 137/73  Pulse: 117 (!) 125 (!) 125 (!) 121  Resp: 19 (!) 31 (!) 28 25  Temp: 98.5 F (36.9 C)     TempSrc: Oral     SpO2: 96% 91% 92% 93%   General: Not in acute distress HEENT:       Eyes: PERRL, EOMI, no scleral icterus.       ENT: No discharge from the ears and nose, no pharynx injection, no tonsillar enlargement.        Neck: positive JVD, no bruit, no mass felt. Heme: No neck lymph node enlargement. Cardiac: S1/S2, RRR, No murmurs, No gallops or rubs. Respiratory: has crackles bilaterally. No wheezing, rhonchi or rubs. GI: Soft, nondistended, nontender, no rebound pain, no organomegaly, BS present. GU: No hematuria Ext: 2+ pitting leg edema bilaterally. 2+DP/PT pulse bilaterally. Musculoskeletal: has right upper fracture wearing splint, has mild tendeness Skin: No rashes.  Neuro: Alert, oriented X3, cranial nerves II-XII grossly intact, moves all extremities . Psych: Patient is not psychotic, no suicidal or hemocidal ideation.  Labs on Admission: I have personally reviewed following labs and imaging studies  CBC:  Recent Labs Lab 02/12/16 2011  WBC 6.7  NEUTROABS 5.1  HGB 10.1*  HCT 29.8*  MCV 97.7  PLT [id number]*   Basic Metabolic Panel:  Recent Labs Lab 02/12/16 2011  NA 134*  K 4.4  CL 101  CO2 22  GLUCOSE 188*  BUN 50*  CREATININE 1.91*  CALCIUM 9.3   GFR: CrCl cannot be calculated (Unknown ideal weight.). Liver Function Tests:  Recent Labs Lab 02/12/16 2011  AST 21  ALT 13*  ALKPHOS 200*  BILITOT 0.7  PROT 6.6  ALBUMIN 3.5   No results for input(s): LIPASE, AMYLASE in the last 168 hours. No results for input(s): AMMONIA in the last 168 hours. Coagulation Profile: No results for input(s): INR, PROTIME in the last 168 hours. Cardiac Enzymes:  Recent Labs Lab 02/12/16 2011  TROPONINI 0.81*   BNP (last 3 results) No results for input(s): PROBNP in the last 8760 hours. HbA1C: No results for input(s): HGBA1C in the last 72  hours. CBG: No results  for input(s): GLUCAP in the last 168 hours. Lipid Profile: No results for input(s): CHOL, HDL, LDLCALC, TRIG, CHOLHDL, LDLDIRECT in the last 72 hours. Thyroid Function Tests: No results for input(s): TSH, T4TOTAL, FREET4, T3FREE, THYROIDAB in the last 72 hours. Anemia Panel: No results for input(s): VITAMINB12, FOLATE, FERRITIN, TIBC, IRON, RETICCTPCT in the last 72 hours. Urine analysis:    Component Value Date/Time   COLORURINE YELLOW 11/15/2015 [place] 11/15/2015 1645   LABSPEC 1.020 11/15/2015 1645   PHURINE 5.0 11/15/2015 [place] 11/15/2015 1645   HGBUR NEGATIVE 11/15/2015 [place] 11/15/2015 1645   KETONESUR NEGATIVE 11/15/2015 1645   PROTEINUR NEGATIVE 11/15/2015 1645   UROBILINOGEN 0.2 06/13/2013 1400   NITRITE NEGATIVE 11/15/2015 1645   LEUKOCYTESUR NEGATIVE 11/15/2015 1645   Sepsis Labs: @LABRCNTIP (procalcitonin:4,lacticidven:4) )No results found for this or any previous visit (from the past 240 hour(s)).   Radiological Exams on Admission: Dg Chest Port 1 View  Result Date: 02/12/2016 CLINICAL DATA:  Shortness of Breath EXAM: PORTABLE CHEST 1 VIEW COMPARISON:  Chest radiograph June 13, 2013 and chest CT June 14, 2014 FINDINGS: There is interstitial edema with small bilateral pleural effusions. There is mild patchy alveolar opacity in the right lower lobe region. Heart is borderline prominent with mild pulmonary venous hypertension. No adenopathy. The patient has an aortic stent in place. Atherosclerotic calcification is also noted in the aorta. There is advanced arthropathy in the right shoulder with bony remodeling, stable. IMPRESSION: Evidence of a degree of congestive heart failure. Question alveolar edema versus superimposed pneumonia right base. Stable cardiac silhouette. Stent in descending thoracic aorta. Aortic atherosclerosis noted. Electronically Signed   By: Lowella Grip III M.D.    On: 02/12/2016 20:59     EKG: Independently reviewed.  Sinus rhythm, tachycardia, QTC 480, T-wave inversion in V4-6, and in inferior leads  Assessment/Plan Principal Problem:   Acute on chronic combined systolic and diastolic CHF (congestive heart failure) (HCC) Active Problems:   Hypertension   High cholesterol   Diabetes mellitus, type 2 (HCC)   Acute respiratory failure with hypoxia (HCC)   Elevated troponin   Acute renal failure superimposed on stage 3 chronic kidney disease (HCC)   Acute exacerbation of CHF (congestive heart failure) (HCC)    Acute respiratory failure with hypoxia due to acute on chronic combined systolic and diastolic CHF: Patient's elevated BNP, positive JVD, leg edema, plus chest x-ray findings are consistent with CHF exacerbation. 2-D echo on 10/22/15 showed EFfor 45-50 percent with grade 2 diastolic dysfunction. EPD discussed with cardiology, Dr. Maceo Pro to continue ASA.  -will admit to SDU due to the need of BiPAP -Lasix 40 mg bid by IV (pt will be given 40 mg of lasix x 2 by IV tonight) -trop x 3 -will continue home Coreg, ASA -Daily weights -strict I/O's -Low salt diet -No ACEI due to worsening CKD  NSTEMI: Troponin 0.81. No chest pain. Likely due to demand ischemia secondary to CHF exacerbation.  -Troponin 3 -Follow-up 2-D echo -Continue aspirin, Lipitor, Coreg, Isordil  Addendum: Trop is trending up 0.81-->-0.78-->1.05  -will start IV heparin now   DM-II: Last A1c 6.0 on 06/14/13, well controled. Patient is taking Amaryl at home -SSI -Check A1c  HLD: Last LDL was not on record -Continue home medications: Lipitor -Check FLP  HTN: -continue Coreg -also on doxazosin and IV Lasix  AoCKD-III: Baseline Cre is 1.3-1.4. Her creatinine is 1.91, BUN 20. Likely due to cardiorenal syndrome. -  Check  FeUrea - Follow up renal function by BMP  Right arm bone fracture: on splint. Has mild pain -prn Norco  GERD: -Protonix   DVT ppx:   On IV Lovenox Code Status: Full code Family Communication: None at bed side.  Disposition Plan:  Anticipate discharge back to previous home environment Consults called: EPD discussed with cardiology, Dr. Aundra Dubin, but not formally consulted  Admission status: SDU/inpation       Date of Service 02/12/2016    Ivor Costa [facility] Pager [phone]  If 7PM-7AM, please contact night-coverage [email or web address] Password [facility] 02/12/2016, 10:39 PM

## 2016-02-12 NOTE — ED Triage Notes (Signed)
Per EMS, pt from home with c/o increased shortness of breath with exertion x 1 week. Pt told to increase lasix dose by PCP, no relief noted. Pt on 2L o2 upon arrival, no distress noted. BP-143/66, HR-119, CBG-277

## 2016-02-12 NOTE — ED Notes (Signed)
Portable x-ray done

## 2016-02-12 NOTE — ED Notes (Signed)
Pt has bilat crackles in lungs.  Oxygen saturation of 91%.  Oxygen increased to 3L/min.

## 2016-02-13 ENCOUNTER — Encounter (HOSPITAL_COMMUNITY): Payer: Self-pay | Admitting: Internal Medicine

## 2016-02-13 DIAGNOSIS — N183 Chronic kidney disease, stage 3 (moderate): Secondary | ICD-10-CM

## 2016-02-13 DIAGNOSIS — N179 Acute kidney failure, unspecified: Secondary | ICD-10-CM

## 2016-02-13 DIAGNOSIS — I5043 Acute on chronic combined systolic (congestive) and diastolic (congestive) heart failure: Secondary | ICD-10-CM

## 2016-02-13 DIAGNOSIS — I214 Non-ST elevation (NSTEMI) myocardial infarction: Principal | ICD-10-CM

## 2016-02-13 DIAGNOSIS — E118 Type 2 diabetes mellitus with unspecified complications: Secondary | ICD-10-CM

## 2016-02-13 DIAGNOSIS — J9601 Acute respiratory failure with hypoxia: Secondary | ICD-10-CM

## 2016-02-13 DIAGNOSIS — I10 Essential (primary) hypertension: Secondary | ICD-10-CM

## 2016-02-13 DIAGNOSIS — E7800 Pure hypercholesterolemia, unspecified: Secondary | ICD-10-CM

## 2016-02-13 LAB — TROPONIN I
TROPONIN I: 0.78 ng/mL — AB (ref <0.03)
TROPONIN I: 1.05 ng/mL — AB (ref <0.03)
Troponin I: 0.98 ng/mL (ref <0.03)

## 2016-02-13 LAB — CBC
HCT: 27.5 % — ABNORMAL LOW (ref 39.0–52.0)
Hemoglobin: 9.3 g/dL — ABNORMAL LOW (ref 13.0–17.0)
MCH: 32.7 pg (ref 26.0–34.0)
MCHC: 33.8 g/dL (ref 30.0–36.0)
MCV: 96.8 fL (ref 78.0–100.0)
Platelets: 125 10*3/uL — ABNORMAL LOW (ref 150–400)
RBC: 2.84 MIL/uL — ABNORMAL LOW (ref 4.22–5.81)
RDW: 13.6 % (ref 11.5–15.5)
WBC: 5.3 10*3/uL (ref 4.0–10.5)

## 2016-02-13 LAB — GLUCOSE, CAPILLARY
GLUCOSE-CAPILLARY: 158 mg/dL — AB (ref 65–99)
GLUCOSE-CAPILLARY: 212 mg/dL — AB (ref 65–99)
GLUCOSE-CAPILLARY: 156 mg/dL — AB (ref 65–99)
Glucose-Capillary: 160 mg/dL — ABNORMAL HIGH (ref 65–99)

## 2016-02-13 LAB — BASIC METABOLIC PANEL
ANION GAP: 12 (ref 5–15)
BUN: 47 mg/dL — ABNORMAL HIGH (ref 6–20)
CALCIUM: 8.8 mg/dL — AB (ref 8.9–10.3)
CO2: 21 mmol/L — AB (ref 22–32)
Chloride: 102 mmol/L (ref 101–111)
Creatinine, Ser: 1.74 mg/dL — ABNORMAL HIGH (ref 0.61–1.24)
GFR calc Af Amer: 40 mL/min — ABNORMAL LOW (ref >60)
GFR calc non Af Amer: 35 mL/min — ABNORMAL LOW (ref >60)
GLUCOSE: 132 mg/dL — AB (ref 65–99)
POTASSIUM: 3.5 mmol/L (ref 3.5–5.1)
Sodium: 135 mmol/L (ref 135–145)

## 2016-02-13 LAB — HEPARIN LEVEL (UNFRACTIONATED)

## 2016-02-13 LAB — LIPID PANEL
CHOL/HDL RATIO: 2.8 ratio
Cholesterol: 109 mg/dL (ref 0–200)
HDL: 39 mg/dL — ABNORMAL LOW (ref >40)
LDL CALC: 56 mg/dL (ref 0–99)
Triglycerides: 69 mg/dL (ref <150)
VLDL: 14 mg/dL (ref 0–40)

## 2016-02-13 LAB — MRSA PCR SCREENING: MRSA BY PCR: NEGATIVE

## 2016-02-13 LAB — PROTIME-INR
INR: 1.15
Prothrombin Time: 14.8 seconds (ref 11.4–15.2)

## 2016-02-13 LAB — CREATININE, URINE, RANDOM: CREATININE, URINE: 31.81 mg/dL

## 2016-02-13 MED ORDER — HEPARIN BOLUS VIA INFUSION
2000.0000 [IU] | Freq: Once | INTRAVENOUS | Status: AC
  Administered 2016-02-13: 2000 [IU] via INTRAVENOUS
  Filled 2016-02-13: qty 2000

## 2016-02-13 MED ORDER — HEPARIN (PORCINE) IN NACL 100-0.45 UNIT/ML-% IJ SOLN
1150.0000 [IU]/h | INTRAMUSCULAR | Status: DC
  Administered 2016-02-13: 900 [IU]/h via INTRAVENOUS
  Administered 2016-02-14: 1100 [IU]/h via INTRAVENOUS
  Administered 2016-02-15: 1150 [IU]/h via INTRAVENOUS
  Filled 2016-02-13 (×3): qty 250

## 2016-02-13 MED ORDER — TAB-A-VITE/IRON PO TABS
1.0000 | ORAL_TABLET | Freq: Every day | ORAL | Status: DC
  Administered 2016-02-13 – 2016-02-18 (×5): 1 via ORAL
  Filled 2016-02-13 (×6): qty 1

## 2016-02-13 MED ORDER — HEPARIN BOLUS VIA INFUSION
4000.0000 [IU] | Freq: Once | INTRAVENOUS | Status: AC
  Administered 2016-02-13: 4000 [IU] via INTRAVENOUS
  Filled 2016-02-13: qty 4000

## 2016-02-13 MED ORDER — LEVALBUTEROL HCL 1.25 MG/0.5ML IN NEBU
1.2500 mg | INHALATION_SOLUTION | Freq: Three times a day (TID) | RESPIRATORY_TRACT | Status: DC
  Administered 2016-02-13 – 2016-02-14 (×4): 1.25 mg via RESPIRATORY_TRACT
  Filled 2016-02-13 (×4): qty 0.5

## 2016-02-13 NOTE — Consult Note (Signed)
CONSULTATION NOTE  Reason for Consult: CHF/Chest pain  Requesting Physician: Dr. Ree Kida  Cardiologist: Dr. Debara Pickett  HPI: This is a 82 y.o. 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 He underwent stent grafting and did well from that. He has as history of hypertension and aortic sclerosis. He has chronic diastolic HF (EF 42-87% in 3/15), presumed CAD, extensive PAD with left femoropopliteal bypass in 1999 preceded by right internal iliac stenting in 1997. 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 He said he also had stent grafting of a penetrating ulcer in the descending and thoracic aorta (done at [facility], 2003). He has diabetes on oral medications as well as hypertension and hyperlipidemia. He was admitted 3/16/-06/16/13 for increased weight gain and worsening LE edema. ECHO showed EF 55-60% with mild diastolic dysfx. Discharge weight 146 lbs. Hospitalized in Feb 2015 with the flu and NSTEMI EF at that time 45%. He was seen last week by Dr. Haroldine Laws in the heart failure clinic for followup. It was noted that he was euvolemic and was maintaining his dry weight without worsening shortness of breath. He was subsequently referred back to me for followup. In July 2017, a repeat echo showed a newly reduced systolic dysfunction with an EF of 45-50% and global hypokinesis. The etiology of this is not clear although may be related to worsening coronary disease however he denies any angina or any new symptoms of decompensated congestive heart failure. Weight has been very stable. We discussed options for workup including stress testing or invasive procedures although since he is asymptomatic it difficult to recommend this. He has had trouble with hyperkalemia and was tried on an ARB but had problems with  elevated potassium therefore it was discontinued. I do not suspect he will tolerate Entresto for the same reasons.   He had a mechanical fall and was seen in the ER on 01/25/2016. Diagnosed with a right proximal humerus fracture. Treated with sling and seen as an outpatient by Dr. Ninfa Linden. He has significant ecchymosis related to this. He now presents to the [facility] ER with shortness of breath over the past week. He recently had an increase in his lasix dosing to 40 mg daily (from 20 mg QOD) without benefit. BNP on admission was 887 and troponin was elevated at 0.81, but increased to 1.05 (IV heparin was started). He was initially hypoxic and placed on BIPAP - CXR shows volume overload. Started on 40 mg IV Lasix BID and diuresed some overnight- creatinine has improved to 1.74 (from 1.91). Weight was 152 lbs at admission (noted to be at 148 lbs on 12/31/15 in the office). Cardiology is asked to evaluate for elevated troponin and CHF exacerbation.  PMHx:  Past Medical History:  Diagnosis Date  . Anemia   . CHF (congestive heart failure) ([place]) 05/2013  . Degenerative disc disease, lumbar   . Diabetes mellitus, type 2 ([place])   . Flu Feb. 9, 2015   ?  Mild Heart Attack  . GERD (gastroesophageal reflux disease)   . H/O thoracic aortic aneurysm repair    Stent Graft (penetrating ulcer) - r/t staphylococcal infection  . High cholesterol   . History of tobacco use    50 pack year history, quit in 2002  . Hypertension   . Left carotid artery occlusion    moderate R  carotid disease; L Common Carotid Bypassed during Arch Repair.  Marland Kitchen MRSA infection    s/p '02-no problems now  . Myocardial infarction Feb. 9, 2015   Mild Heart attack  . Osteoporosis, senile   . PAD (peripheral artery disease) (HCC)    s/p L Fem-Pop Bypass; R Iliac PTA  . Seasonal allergies   . Stroke ([place])   . Transfusion history    '02- back surgery   Past Surgical History:  Procedure Laterality Date  . AORTIC ARCH REPAIR - with R  Innominate Artery Resection; bypass to distal R Innominate & L common carotid  07/2000   For Mycotic Aneurysm of R Innominate A with occluded Innominate & L Common Carotid  . BACK SURGERY  2001  . CLEFT LIP REPAIR    . COLONOSCOPY WITH PROPOFOL N/A 08/10/2012   Procedure: COLONOSCOPY WITH PROPOFOL;  Surgeon: Garlan Fair, MD;  Location: [facility];  Service: Endoscopy;  Laterality: N/A;  . ESOPHAGOGASTRODUODENOSCOPY (EGD) WITH PROPOFOL N/A 08/10/2012   Procedure: ESOPHAGOGASTRODUODENOSCOPY (EGD) WITH PROPOFOL;  Surgeon: Garlan Fair, MD;  Location: [facility];  Service: Endoscopy;  Laterality: N/A;  . HEMIARTHROPLASTY HIP Right   . ILIAC ARTERY STENT Right    PTA -STENT  . JOINT REPLACEMENT Right 2003   right hip   . PR VEIN BYPASS GRAFT,AORTO-FEM-POP  1999  . THORACIC AORTIC ENDOVASCULAR STENT GRAFT  2003   @ [facility]  - for Penetrating Ulcer/Aneurysm (cardiac cath done 01/08/2001 by Dr. Antionette Poles)  . TRANSTHORACIC ECHOCARDIOGRAM  10/2009   EF=>55%, LA mildly dilated, mild mitral annular calfication, mild MR, mild TR, RVSP 30-104mHg, AV mildly sclerotic, trace pulm valve regurg  . VASCULAR SURGERY      FAMHx: Family History  Problem Relation Age of Onset  . Diabetes Mother   . Hypertension Mother     heart disese, died of MI at 722 . Hyperlipidemia Mother   . Heart disease Father     CHF  . Heart attack Father   . Arthritis Father   . Diabetes Sister     also Alzheimers  . Heart disease Son     Heart Disease before age 634 . Marfan syndrome Daughter   . AAA (abdominal aortic aneurysm) Son     repair in 20s  . Marfan syndrome Son     SOCHx:  reports that he quit smoking about 15 years ago. He has never used smokeless tobacco. He reports that he does not drink alcohol or use drugs.  ALLERGIES: Allergies  Allergen Reactions  . Codeine Nausea And Vomiting    ROS: Pertinent items noted in HPI and remainder of comprehensive ROS otherwise negative.  HOME  MEDICATIONS: No current facility-administered medications on file prior to encounter.    Current Outpatient Prescriptions on File Prior to Encounter  Medication Sig Dispense Refill  . amoxicillin (AMOXIL) 500 MG capsule Take 2,000 mg by mouth as needed (1 hour before dental appointments).   1  . aspirin EC 81 MG tablet Take 81 mg by mouth every morning.     .Marland Kitchenatorvastatin (LIPITOR) 40 MG tablet Take 20 mg by mouth every evening.     .Marland KitchenCARTIA XT 240 MG 24 hr capsule Take 240 mg by mouth every morning.    . carvedilol (COREG) 6.25 MG tablet Take 6.25 mg by mouth 2 (two) times daily with a meal.    . Cholecalciferol (VITAMIN D) 2000 UNITS tablet Take 2,000 Units by mouth daily.    .Marland Kitchen  diphenhydrAMINE (BENADRYL) 25 MG tablet Take 25 mg by mouth at bedtime.    Marland Kitchen doxazosin (CARDURA) 2 MG tablet Take 2 mg by mouth daily.     . Ferrous Gluconate (IRON) 246 (28 FE) MG TABS Take 2 tablets by mouth daily.    . fluticasone (FLONASE) 50 MCG/ACT nasal spray Place 1 spray into the nose 2 (two) times daily.     . furosemide (LASIX) 20 MG tablet Take 20 mg by mouth every other day. Take 1 tab every other day depending on weight gain    . glimepiride (AMARYL) 2 MG tablet Take 1 mg by mouth daily as needed (for blood sugar over 135 (1/8 of a 2 mg tablet)).     . isosorbide dinitrate (ISORDIL) 10 MG tablet Take 1 tablet (10 mg total) by mouth 3 (three) times daily. 90 tablet 2  . loratadine (CLARITIN) 10 MG tablet Take 10 mg by mouth daily.    . Multiple Vitamin (MULTIVITAMIN) capsule Take 1 capsule by mouth daily.    . niacin (SLO-NIACIN) 500 MG tablet Take 500 mg by mouth daily.     . Omega-3 Fatty Acids (FISH OIL) 1200 MG CAPS Take 4,800 mg by mouth every morning.     Marland Kitchen omeprazole (PRILOSEC) 20 MG capsule Take 20 mg by mouth daily.    . polyethylene glycol (MIRALAX / GLYCOLAX) packet Take 17 g by mouth every evening.     Marland Kitchen PREVIDENT 5000 BOOSTER PLUS 1.1 % PSTE Take 1 application by mouth every evening.  Use  once in place of toothpaste brush 2 minutes then spit. DO NOT RINSE  0  . temazepam (RESTORIL) 30 MG capsule Take 30 mg by mouth at bedtime. For sleep    . HYDROcodone-acetaminophen (NORCO/VICODIN) 5-325 MG tablet Take 1 tablet by mouth every 6 (six) hours. (Patient not taking: Reported on 02/12/2016) 30 tablet 0    HOSPITAL MEDICATIONS: Prior to Admission:  Prescriptions Prior to Admission  Medication Sig Dispense Refill Last Dose  . amoxicillin (AMOXIL) 500 MG capsule Take 2,000 mg by mouth as needed (1 hour before dental appointments).   1 prn  . aspirin EC 81 MG tablet Take 81 mg by mouth every morning.    02/12/2016 at Unknown time  . atorvastatin (LIPITOR) 40 MG tablet Take 20 mg by mouth every evening.    02/11/2016 at Unknown time  . CARTIA XT 240 MG 24 hr capsule Take 240 mg by mouth every morning.   02/12/2016 at Unknown time  . carvedilol (COREG) 6.25 MG tablet Take 6.25 mg by mouth 2 (two) times daily with a meal.   02/12/2016 at 1800  . Cholecalciferol (VITAMIN D) 2000 UNITS tablet Take 2,000 Units by mouth daily.   02/12/2016 at Unknown time  . diphenhydrAMINE (BENADRYL) 25 MG tablet Take 25 mg by mouth at bedtime.   02/11/2016 at Unknown time  . doxazosin (CARDURA) 2 MG tablet Take 2 mg by mouth daily.    02/12/2016 at Unknown time  . Ferrous Gluconate (IRON) 246 (28 FE) MG TABS Take 2 tablets by mouth daily.   02/12/2016 at Unknown time  . fluticasone (FLONASE) 50 MCG/ACT nasal spray Place 1 spray into the nose 2 (two) times daily.    02/12/2016 at Unknown time  . furosemide (LASIX) 20 MG tablet Take 20 mg by mouth every other day. Take 1 tab every other day depending on weight gain   02/12/2016 at Unknown time  . glimepiride (AMARYL) 2 MG tablet Take  1 mg by mouth daily as needed (for blood sugar over 135 (1/8 of a 2 mg tablet)).    02/12/2016 at Unknown time  . isosorbide dinitrate (ISORDIL) 10 MG tablet Take 1 tablet (10 mg total) by mouth 3 (three) times daily. 90 tablet 2  02/12/2016 at Unknown time  . loratadine (CLARITIN) 10 MG tablet Take 10 mg by mouth daily.   02/12/2016 at Unknown time  . Multiple Vitamin (MULTIVITAMIN) capsule Take 1 capsule by mouth daily.   02/12/2016 at Unknown time  . niacin (SLO-NIACIN) 500 MG tablet Take 500 mg by mouth daily.    02/12/2016 at Unknown time  . Omega-3 Fatty Acids (FISH OIL) 1200 MG CAPS Take 4,800 mg by mouth every morning.    02/12/2016 at Unknown time  . omeprazole (PRILOSEC) 20 MG capsule Take 20 mg by mouth daily.   02/12/2016 at Unknown time  . polyethylene glycol (MIRALAX / GLYCOLAX) packet Take 17 g by mouth every evening.    02/11/2016 at Unknown time  . PREVIDENT 5000 BOOSTER PLUS 1.1 % PSTE Take 1 application by mouth every evening.  Use once in place of toothpaste brush 2 minutes then spit. DO NOT RINSE  0 02/11/2016 at Unknown time  . temazepam (RESTORIL) 30 MG capsule Take 30 mg by mouth at bedtime. For sleep   02/11/2016 at Unknown time  . HYDROcodone-acetaminophen (NORCO/VICODIN) 5-325 MG tablet Take 1 tablet by mouth every 6 (six) hours. (Patient not taking: Reported on 02/12/2016) 30 tablet 0 Not Taking at Unknown time    VITALS: Blood pressure 122/68, pulse 95, temperature 97.8 F (36.6 C), temperature source Oral, resp. rate 16, height _0  (1.651 m), weight 152 lb 1.9 oz (69 kg), SpO2 98 %.  PHYSICAL EXAM: General appearance: alert, appears older than stated age and mild distress Neck: JVD - 3 cm above sternal notch and no carotid bruit Lungs: diminished breath sounds bilaterally and rales bibasilar Heart: regular rate and rhythm, S1, S2 normal and systolic murmur: holosystolic 2/6, blowing at apex Abdomen: soft, non-tender; bowel sounds normal; no masses,  no organomegaly Extremities: marked left-sided thoracic ecchymosis, right humeral fracture in a sling Pulses: 2+ and symmetric Skin: right chest wall ecchymosis from recent fall Neurologic: Mental status: Alert, oriented, thought content  appropriate, speech impedement is baseline Psych: Pleasant  LABS: Results for orders placed or performed during the hospital encounter of 02/12/16 (from the past 48 hour(s))  Comprehensive metabolic panel     Status: Abnormal   Collection Time: 02/12/16  8:11 PM  Result Value Ref Range   Sodium 134 (L) 135 - 145 mmol/L   Potassium 4.4 3.5 - 5.1 mmol/L   Chloride 101 101 - 111 mmol/L   CO2 22 22 - 32 mmol/L   Glucose, Bld 188 (H) 65 - 99 mg/dL   BUN 50 (H) 6 - 20 mg/dL   Creatinine, Ser 1.91 (H) 0.61 - 1.24 mg/dL   Calcium 9.3 8.9 - 10.3 mg/dL   Total Protein 6.6 6.5 - 8.1 g/dL   Albumin 3.5 3.5 - 5.0 g/dL   AST 21 15 - 41 U/L   ALT 13 (L) 17 - 63 U/L   Alkaline Phosphatase 200 (H) 38 - 126 U/L   Total Bilirubin 0.7 0.3 - 1.2 mg/dL   GFR calc non Af Amer 31 (L) >60 mL/min   GFR calc Af Amer 36 (L) >60 mL/min    Comment: (NOTE) The eGFR has been calculated using the CKD EPI equation. This  calculation has not been validated in all clinical situations. eGFR's persistently <60 mL/min signify possible Chronic Kidney Disease.    Anion gap 11 5 - 15  Brain natriuretic peptide     Status: Abnormal   Collection Time: 02/12/16  8:11 PM  Result Value Ref Range   B Natriuretic Peptide 887.3 (H) 0.0 - 100.0 pg/mL  Troponin I     Status: Abnormal   Collection Time: 02/12/16  8:11 PM  Result Value Ref Range   Troponin I 0.81 (HH) <0.03 ng/mL    Comment: CRITICAL RESULT CALLED TO, READ BACK BY AND VERIFIED WITH: B.PECINICH,RN 2124 02/12/16 M.CAMPBELL   CBC with Differential     Status: Abnormal   Collection Time: 02/12/16  8:11 PM  Result Value Ref Range   WBC 6.7 4.0 - 10.5 K/uL   RBC 3.05 (L) 4.22 - 5.81 MIL/uL   Hemoglobin 10.1 (L) 13.0 - 17.0 g/dL   HCT 29.8 (L) 39.0 - 52.0 %   MCV 97.7 78.0 - 100.0 fL   MCH 33.1 26.0 - 34.0 pg   MCHC 33.9 30.0 - 36.0 g/dL   RDW 13.8 11.5 - 15.5 %   Platelets 139 (L) 150 - 400 K/uL   Neutrophils Relative % 76 %   Neutro Abs 5.1 1.7 - 7.7  K/uL   Lymphocytes Relative 13 %   Lymphs Abs 0.9 0.7 - 4.0 K/uL   Monocytes Relative 8 %   Monocytes Absolute 0.5 0.1 - 1.0 K/uL   Eosinophils Relative 3 %   Eosinophils Absolute 0.2 0.0 - 0.7 K/uL   Basophils Relative 0 %   Basophils Absolute 0.0 0.0 - 0.1 K/uL  Troponin I (q 6hr x 3)     Status: Abnormal   Collection Time: 02/13/16 12:05 AM  Result Value Ref Range   Troponin I 0.78 (HH) <0.03 ng/mL    Comment: CRITICAL VALUE NOTED.  VALUE IS CONSISTENT WITH PREVIOUSLY REPORTED AND CALLED VALUE.  Protime-INR     Status: None   Collection Time: 02/13/16 12:05 AM  Result Value Ref Range   Prothrombin Time 14.8 11.4 - 15.2 seconds   INR 1.15   MRSA PCR Screening     Status: None   Collection Time: 02/13/16 12:12 AM  Result Value Ref Range   MRSA by PCR NEGATIVE NEGATIVE    Comment:        The GeneXpert MRSA Assay (FDA approved for NASAL specimens only), is one component of a comprehensive MRSA colonization surveillance program. It is not intended to diagnose MRSA infection nor to guide or monitor treatment for MRSA infections.   Glucose, capillary     Status: Abnormal   Collection Time: 02/13/16 12:47 AM  Result Value Ref Range   Glucose-Capillary 160 (H) 65 - 99 mg/dL  Creatinine, urine, random     Status: None   Collection Time: 02/13/16  2:25 AM  Result Value Ref Range   Creatinine, Urine 31.81 mg/dL  Lipid panel     Status: Abnormal   Collection Time: 02/13/16  4:27 AM  Result Value Ref Range   Cholesterol 109 0 - 200 mg/dL   Triglycerides 69 <150 mg/dL   HDL 39 (L) >40 mg/dL   Total CHOL/HDL Ratio 2.8 RATIO   VLDL 14 0 - 40 mg/dL   LDL Cholesterol 56 0 - 99 mg/dL    Comment:        Total Cholesterol/HDL:CHD Risk Coronary Heart Disease Risk Table  Men   Women  1/2 Average Risk   3.4   3.3  Average Risk       5.0   4.4  2 X Average Risk   9.6   7.1  3 X Average Risk  23.4   11.0        Use the calculated Patient Ratio above and the CHD  Risk Table to determine the patient's CHD Risk.        ATP III CLASSIFICATION (LDL):  <100     mg/dL   Optimal  100-129  mg/dL   Near or Above                    Optimal  130-159  mg/dL   Borderline  160-189  mg/dL   High  >190     mg/dL   Very High   Troponin I (q 6hr x 3)     Status: Abnormal   Collection Time: 02/13/16  4:27 AM  Result Value Ref Range   Troponin I 1.05 (HH) <0.03 ng/mL    Comment: CRITICAL VALUE NOTED.  VALUE IS CONSISTENT WITH PREVIOUSLY REPORTED AND CALLED VALUE.  Basic metabolic panel     Status: Abnormal   Collection Time: 02/13/16  4:27 AM  Result Value Ref Range   Sodium 135 135 - 145 mmol/L   Potassium 3.5 3.5 - 5.1 mmol/L    Comment: DELTA CHECK NOTED   Chloride 102 101 - 111 mmol/L   CO2 21 (L) 22 - 32 mmol/L   Glucose, Bld 132 (H) 65 - 99 mg/dL   BUN 47 (H) 6 - 20 mg/dL   Creatinine, Ser 1.74 (H) 0.61 - 1.24 mg/dL   Calcium 8.8 (L) 8.9 - 10.3 mg/dL   GFR calc non Af Amer 35 (L) >60 mL/min   GFR calc Af Amer 40 (L) >60 mL/min    Comment: (NOTE) The eGFR has been calculated using the CKD EPI equation. This calculation has not been validated in all clinical situations. eGFR's persistently <60 mL/min signify possible Chronic Kidney Disease.    Anion gap 12 5 - 15    IMAGING: Dg Chest Port 1 View  Result Date: 02/12/2016 CLINICAL DATA:  Shortness of Breath EXAM: PORTABLE CHEST 1 VIEW COMPARISON:  Chest radiograph June 13, 2013 and chest CT June 14, 2014 FINDINGS: There is interstitial edema with small bilateral pleural effusions. There is mild patchy alveolar opacity in the right lower lobe region. Heart is borderline prominent with mild pulmonary venous hypertension. No adenopathy. The patient has an aortic stent in place. Atherosclerotic calcification is also noted in the aorta. There is advanced arthropathy in the right shoulder with bony remodeling, stable. IMPRESSION: Evidence of a degree of congestive heart failure. Question alveolar edema  versus superimposed pneumonia right base. Stable cardiac silhouette. Stent in descending thoracic aorta. Aortic atherosclerosis noted. Electronically Signed   By: Lowella Grip III M.D.   On: 02/12/2016 20:59    HOSPITAL DIAGNOSES: Principal Problem:   Acute on chronic combined systolic and diastolic CHF (congestive heart failure) (HCC) Active Problems:   Hypertension   High cholesterol   Diabetes mellitus, type 2 ([place])   NSTEMI, initial episode of care [facility])   Acute respiratory failure with hypoxia (HCC)   Elevated troponin   Acute renal failure superimposed on stage 3 chronic kidney disease (HCC)   Acute exacerbation of CHF (congestive heart failure) (HCC)   IMPRESSION: 1. Acute combined systolic and diastolic congestive heart failure -  LVEF 45-50% recently 2. AKI on CKD (creatinine 1.91, now trending down) 3. History of hyperkalemia on ARB 4. NSTEMI - elevated troponin to 1.05 5. HTN 6. DM2 7. Dyslipidemia 8. History of myocotic aneurysm 9. Family history of Marfan's disorder in children  RECOMMENDATION: 1. Acute systolic congestive heart failure - may be related to worsening CAD, now with elevated troponin suggesting underlying obstructive CAD as a likely cause of his worsening LVEF although he has not had angina. Will need L/RHC when his creatinine is improved and he is adequately diuresed to allow him to lay flat for the procedure. He would need a right radial or femoral approach given his recent right humeral fracture. Would continue IV diuresis - watch creatinine closely. Not a candidate for ACE/ARB/Entresto given hyperkalemia and acute kidney injury. 2. NSTEMI - troponin rise not entirely consistent with CHF or demand ischemia, I think this suggests obstructive CAD - on IV heparin, aspirin and lipitor 20 mg daily (cholesterol is well controlled with LDL of 56). Again, will need LHC in the near future. 3. Dyslipidemia - LDL at goal on lipitor - will not acutely increase, as  this in not likely type 1 MI. 4. Hypertension - controlled on carvedilol, diltiazem and isordil.  FULL CODE  Thanks for the consultation - cardiology will follow closely with you.  Time Spent Directly with Patient: 45 minutes  Pixie Casino, MD, [facility] Attending Cardiologist [place] 02/13/2016, 9:42 AM

## 2016-02-13 NOTE — Progress Notes (Signed)
PROGRESS NOTE    LEVELLE BALSAMO  [id number] DOB: 1930/10/26 DOA: 02/12/2016 PCP: [name], MD   Chief Complaint  Patient presents with  . Shortness of Breath    Brief Narrative:  HPI on 02/12/2016 by Dr. Ivor Costa FRANC LEVASSEUR is a 82 y.o. male with medical history significant of chronic combined systolic and diastolic CHF, hypertension, hyperlipidemia, diabetes mellitus, GERD, stroke, PAD, CAD, formal smoker, anemia, CKD-III, who presents with shortness of breath.  Patient states that she has been having shortness of breath for a week, which has been progressively getting worse. It is aggravated by exertion. Patient was on Lasix 20 mg every other days, which was increased to 40 mg daily by his PCP without significant help. Patient has mild dry cough, but no chest pain fever or chills. She does not have nausea, vomiting, abdominal pain, diarrhea, symptoms of UTI. No new unilateral weakness, vision change or hearing loss. He has bilateral leg edema. Of note, pt has right upper fracture wearing splint, has mild pain. Assessment & Plan   Acute respiratory failure with hypoxia likely secondary to CHF exacerbation -Patient noted to have oxygen saturations of 91% on room air however was placed on BiPAP in the emergency department, O2 sats improved to 93% -Currently stable, on nasal cannula  Acute on chronic combined systolic and diastolic heart failure -Chest x-ray showed degree of congestive heart failure -BNP 887 -Echocardiogram 10/22/2015 showed an EF of Q000111Q, grade 2 diastolic dysfunction -Possibly related to worsening coronary artery disease as patient does have elevated troponin -Cardiology consulted and appreciated -Plan for right and left heart cath once creatinine has improved -Patient currently not a candidate for ACEi/ARB/Entresto due to AK I and history of hyperkalemia -Continue diuresis, monitor daily weights, intake, output  Elevated troponin/NSTEMI -Troponin  peaked at 1.05 -Continue IV heparin -Continue aspirin, statin -Plan for left heart cath once creatinine has improved  Acute on chronic kidney disease, stage III -Baseline creatinine 1.3-1.4 -Upon admission creatinine 1.91 -Creatinine currently 1.74 -Currently diuresing, continue to monitor BMP  Chronic normocytic anemia -Baseline hemoglobin approximate 10, currently 10.1 -Continue iron supplementation -Monitor CBC  Diabetes mellitus, type II -Continue insulin sliding scale, CBG monitoring  Hyperlipidemia -Continue statin  Hypertension -Continue Coreg, diltiazem, Isordil  Right humeral fracture status post fall -Currently in sling -Patient follows up with orthopedics -Continue pain control  GERD -Continue PPI  DVT Prophylaxis  heparin  Code Status: Full  Family Communication: None bedside  Disposition Plan: Admitted. Continue CHF treatment.  Consultants Cardiology  Procedures  None  Antibiotics   Anti-infectives    None      Subjective:   Labrandon Dillingham seen and examined today.  Patient states he's feeling mildly better this morning. Denies any current chest pain or shortness of breath, abdominal pain, nausea or vomiting, diarrhea or constipation.  Objective:   Vitals:   02/13/16 0536 02/13/16 0627 02/13/16 0744 02/13/16 0855  BP: 112/60 113/60 122/68   Pulse: 92  95   Resp: (!) 31  16   Temp: 97.6 F (36.4 C)  97.8 F (36.6 C)   TempSrc: Oral  Oral   SpO2: 99%  99% 98%  Weight: 69 kg (152 lb 1.9 oz)     Height:        Intake/Output Summary (Last 24 hours) at 02/13/16 1057 Last data filed at 02/13/16 0600  Gross per 24 hour  Intake              100 ml  Output              425 ml  Net             -325 ml   Filed Weights   02/13/16 0014 02/13/16 0536  Weight: 69 kg (152 lb 1.9 oz) 69 kg (152 lb 1.9 oz)    Exam  General: Well developed, well nourished, NAD, appears stated age  10: NCAT, , mucous membranes moist.    Cardiovascular: S1 S2 auscultated, 2/6SEM, RRR  Respiratory: Diminished breath sounds  Abdomen: Soft, nontender, nondistended, + bowel sounds  Extremities: warm dry without cyanosis clubbing or edema  Neuro: AAOx3, nonfocal (patient does have a speech impediment at baseline)  Skin: Without rashes exudates or nodules, moderate ecchymosis noted on the right upper extremity and chest wall  Psych: Normal affect and demeanor with intact judgement and insight   Data Reviewed: I have personally reviewed following labs and imaging studies  CBC:  Recent Labs Lab 02/12/16 2011  WBC 6.7  NEUTROABS 5.1  HGB 10.1*  HCT 29.8*  MCV 97.7  PLT [id number]*   Basic Metabolic Panel:  Recent Labs Lab 02/12/16 2011 02/13/16 0427  NA 134* 135  K 4.4 3.5  CL 101 102  CO2 22 21*  GLUCOSE 188* 132*  BUN 50* 47*  CREATININE 1.91* 1.74*  CALCIUM 9.3 8.8*   GFR: Estimated Creatinine Clearance: 28.5 mL/min (by C-G formula based on SCr of 1.74 mg/dL (H)). Liver Function Tests:  Recent Labs Lab 02/12/16 2011  AST 21  ALT 13*  ALKPHOS 200*  BILITOT 0.7  PROT 6.6  ALBUMIN 3.5   No results for input(s): LIPASE, AMYLASE in the last 168 hours. No results for input(s): AMMONIA in the last 168 hours. Coagulation Profile:  Recent Labs Lab 02/13/16 0005  INR 1.15   Cardiac Enzymes:  Recent Labs Lab 02/12/16 2011 02/13/16 0005 02/13/16 0427  TROPONINI 0.81* 0.78* 1.05*   BNP (last 3 results) No results for input(s): PROBNP in the last 8760 hours. HbA1C: No results for input(s): HGBA1C in the last 72 hours. CBG:  Recent Labs Lab 02/13/16 0047  GLUCAP 160*   Lipid Profile:  Recent Labs  02/13/16 0427  CHOL 109  HDL 39*  LDLCALC 56  TRIG 69  CHOLHDL 2.8   Thyroid Function Tests: No results for input(s): TSH, T4TOTAL, FREET4, T3FREE, THYROIDAB in the last 72 hours. Anemia Panel: No results for input(s): VITAMINB12, FOLATE, FERRITIN, TIBC, IRON, RETICCTPCT in the  last 72 hours. Urine analysis:    Component Value Date/Time   COLORURINE YELLOW 11/15/2015 [place] 11/15/2015 1645   LABSPEC 1.020 11/15/2015 1645   PHURINE 5.0 11/15/2015 1645   GLUCOSEU NEGATIVE 11/15/2015 1645   HGBUR NEGATIVE 11/15/2015 [place] 11/15/2015 1645   KETONESUR NEGATIVE 11/15/2015 1645   PROTEINUR NEGATIVE 11/15/2015 1645   UROBILINOGEN 0.2 06/13/2013 1400   NITRITE NEGATIVE 11/15/2015 1645   LEUKOCYTESUR NEGATIVE 11/15/2015 1645   Sepsis Labs: @LABRCNTIP (procalcitonin:4,lacticidven:4)  ) Recent Results (from the past 240 hour(s))  MRSA PCR Screening     Status: None   Collection Time: 02/13/16 12:12 AM  Result Value Ref Range Status   MRSA by PCR NEGATIVE NEGATIVE Final    Comment:        The GeneXpert MRSA Assay (FDA approved for NASAL specimens only), is one component of a comprehensive MRSA colonization surveillance program. It is not intended to diagnose MRSA infection nor to guide or monitor treatment  for MRSA infections.       Radiology Studies: Dg Chest Port 1 View  Result Date: 02/12/2016 CLINICAL DATA:  Shortness of Breath EXAM: PORTABLE CHEST 1 VIEW COMPARISON:  Chest radiograph June 13, 2013 and chest CT June 14, 2014 FINDINGS: There is interstitial edema with small bilateral pleural effusions. There is mild patchy alveolar opacity in the right lower lobe region. Heart is borderline prominent with mild pulmonary venous hypertension. No adenopathy. The patient has an aortic stent in place. Atherosclerotic calcification is also noted in the aorta. There is advanced arthropathy in the right shoulder with bony remodeling, stable. IMPRESSION: Evidence of a degree of congestive heart failure. Question alveolar edema versus superimposed pneumonia right base. Stable cardiac silhouette. Stent in descending thoracic aorta. Aortic atherosclerosis noted. Electronically Signed   By: Lowella Grip III M.D.   On:  02/12/2016 20:59     Scheduled Meds: . aspirin EC  81 mg Oral q morning - 10a  . atorvastatin  20 mg Oral QPM  . carvedilol  6.25 mg Oral BID WC  . cholecalciferol  2,000 Units Oral Daily  . diltiazem  240 mg Oral BH-q7a  . diphenhydrAMINE  25 mg Oral QHS  . doxazosin  2 mg Oral Daily  . ferrous gluconate  324 mg Oral Daily  . fluticasone  1 spray Each Nare BID  . furosemide  40 mg Intravenous BID  . insulin aspart  0-5 Units Subcutaneous QHS  . insulin aspart  0-9 Units Subcutaneous TID WC  . isosorbide dinitrate  10 mg Oral TID  . levalbuterol  1.25 mg Nebulization TID  . loratadine  10 mg Oral Daily  . multivitamins with iron  1 tablet Oral Daily  . niacin  500 mg Oral Daily  . omega-3 acid ethyl esters  1 g Oral Daily  . pantoprazole  40 mg Oral Daily  . sodium chloride flush  3 mL Intravenous Q12H  . temazepam  30 mg Oral QHS   Continuous Infusions: . heparin 900 Units/hr (02/13/16 0806)     LOS: 1 day   Time Spent in minutes   30 minutes  Maxwell Martorano D.O. on 02/13/2016 at 10:57 AM  Between 7am to 7pm - Pager - [phone]  After 7pm go to [email or web address] - password [email or web address]  And look for the night coverage person covering for me after hours  [facility] Office  [phone]

## 2016-02-13 NOTE — Progress Notes (Signed)
RT removed patient from BIPAP and placed patient on 3.5L [place]. Patient states he feels better and is no longer SOB. RT told patient to call if he felt SOB again.

## 2016-02-13 NOTE — Progress Notes (Signed)
ANTICOAGULATION CONSULT NOTE - f/u Pharmacy Consult for heparin  Indication: chest pain/ACS  Allergies  Allergen Reactions  . Codeine Nausea And Vomiting    Patient Measurements: Height: 5\' 5"  (165.1 cm) Weight: 152 lb 1.9 oz (69 kg) IBW/kg (Calculated) : 61.5  Heparin dosing weight: 69 kg  Vital Signs: Temp: 98 F (36.7 C) (11/15 1500) Temp Source: Oral (11/15 1500) BP: 115/62 (11/15 1500) Pulse Rate: 86 (11/15 1500)  Labs:  Recent Labs  02/12/16 2011 02/13/16 0005 02/13/16 0427 02/13/16 1103 02/13/16 1550  HGB 10.1*  --   --   --  9.3*  HCT 29.8*  --   --   --  27.5*  PLT 139*  --   --   --  125*  LABPROT  --  14.8  --   --   --   INR  --  1.15  --   --   --   HEPARINUNFRC  --   --   --   --  <0.10*  CREATININE 1.91*  --  1.74*  --   --   TROPONINI 0.81* 0.78* 1.05* 0.98*  --     Estimated Creatinine Clearance: 28.5 mL/min (by C-G formula based on SCr of 1.74 mg/dL (H)).   Medical History: Past Medical History:  Diagnosis Date  . Anemia   . CHF (congestive heart failure) ([place]) 05/2013  . Degenerative disc disease, lumbar   . Diabetes mellitus, type 2 ([place])   . Dyspnea   . Flu Feb. 9, 2015   ?  Mild Heart Attack  . GERD (gastroesophageal reflux disease)   . H/O thoracic aortic aneurysm repair    Stent Graft (penetrating ulcer) - r/t staphylococcal infection  . High cholesterol   . History of tobacco use    50 pack year history, quit in 2002  . Hypertension   . Left carotid artery occlusion    moderate R carotid disease; L Common Carotid Bypassed during Arch Repair.  Marland Kitchen MRSA infection    s/p '02-no problems now  . Myocardial infarction Feb. 9, 2015   Mild Heart attack  . Osteoporosis, senile   . PAD (peripheral artery disease) ([facility])    s/p L Fem-Pop Bypass; R Iliac PTA  . Seasonal allergies   . Stroke ([place])   . Transfusion history    '02- back surgery    Assessment: 82 yo M admitted with SOB. Heparin while cardiac workup ongoing. No known  anticoagulation PTA. CBC stable.  - PM HL <0.1, + troponins, Hgb down to 9.3, plts low normal  Goal of Therapy:  Heparin level 0.3-0.7 units/ml Monitor platelets by anticoagulation protocol: Yes   Plan:  Heparin 2000 unit bolus Heparin infusion 1100 units/hr Recheck heparin level in 8 hrs.   Elinora Weigand S. Alford Highland, PharmD, BCPS Clinical Staff Pharmacist Pager [phone]  Eilene Ghazi Stillinger 02/13/2016,4:56 PM

## 2016-02-13 NOTE — Progress Notes (Signed)
ANTICOAGULATION CONSULT NOTE - Initial Consult  Pharmacy Consult for heparin  Indication: chest pain/ACS  Allergies  Allergen Reactions  . Codeine Nausea And Vomiting    Patient Measurements: Height: 5\' 5"  (165.1 cm) Weight: 152 lb 1.9 oz (69 kg) IBW/kg (Calculated) : 61.5  Vital Signs: Temp: 97.6 F (36.4 C) (11/15 0536) Temp Source: Oral (11/15 0536) BP: 113/60 (11/15 0627) Pulse Rate: 92 (11/15 0536)  Labs:  Recent Labs  02/12/16 2011 02/13/16 0005 02/13/16 0427  HGB 10.1*  --   --   HCT 29.8*  --   --   PLT 139*  --   --   LABPROT  --  14.8  --   INR  --  1.15  --   CREATININE 1.91*  --  1.74*  TROPONINI 0.81* 0.78* 1.05*    Estimated Creatinine Clearance: 28.5 mL/min (by C-G formula based on SCr of 1.74 mg/dL (H)).   Medical History: Past Medical History:  Diagnosis Date  . Anemia   . CHF (congestive heart failure) ([place]) 05/2013  . Degenerative disc disease, lumbar   . Diabetes mellitus, type 2 ([place])   . Flu Feb. 9, 2015   ?  Mild Heart Attack  . GERD (gastroesophageal reflux disease)   . H/O thoracic aortic aneurysm repair    Stent Graft (penetrating ulcer) - r/t staphylococcal infection  . High cholesterol   . History of tobacco use    50 pack year history, quit in 2002  . Hypertension   . Left carotid artery occlusion    moderate R carotid disease; L Common Carotid Bypassed during Arch Repair.  Marland Kitchen MRSA infection    s/p '02-no problems now  . Myocardial infarction Feb. 9, 2015   Mild Heart attack  . Osteoporosis, senile   . PAD (peripheral artery disease) ([facility])    s/p L Fem-Pop Bypass; R Iliac PTA  . Seasonal allergies   . Stroke ([place])   . Transfusion history    '02- back surgery    Assessment: 82 yo M admitted with SOB. Heparin while cardiac workup ongoing. No known anticoagulation PTA. CBC stable.   Goal of Therapy:  Heparin level 0.3-0.7 units/ml Monitor platelets by anticoagulation protocol: Yes   Plan:  1. Give 4000 units bolus  x 1 2. Start heparin infusion at 900 units/hr 3. Check anti-Xa level in 8 hours and daily while on heparin 4. Continue to monitor H&H and platelets  Duayne Cal 02/13/2016,7:02 AM

## 2016-02-13 NOTE — Plan of Care (Signed)
Problem: Safety: Goal: Ability to remain free from injury will improve Outcome: Progressing Oriented to call light, educated to call for assistance , son at bedside, verbalized understanding

## 2016-02-14 ENCOUNTER — Ambulatory Visit (INDEPENDENT_AMBULATORY_CARE_PROVIDER_SITE_OTHER): Payer: Self-pay | Admitting: Orthopaedic Surgery

## 2016-02-14 ENCOUNTER — Telehealth: Payer: Self-pay | Admitting: Internal Medicine

## 2016-02-14 ENCOUNTER — Telehealth (INDEPENDENT_AMBULATORY_CARE_PROVIDER_SITE_OTHER): Payer: Self-pay | Admitting: *Deleted

## 2016-02-14 DIAGNOSIS — R748 Abnormal levels of other serum enzymes: Secondary | ICD-10-CM

## 2016-02-14 LAB — CBC
HEMATOCRIT: 28.2 % — AB (ref 39.0–52.0)
HEMOGLOBIN: 9.3 g/dL — AB (ref 13.0–17.0)
MCH: 32.5 pg (ref 26.0–34.0)
MCHC: 33 g/dL (ref 30.0–36.0)
MCV: 98.6 fL (ref 78.0–100.0)
Platelets: 128 10*3/uL — ABNORMAL LOW (ref 150–400)
RBC: 2.86 MIL/uL — ABNORMAL LOW (ref 4.22–5.81)
RDW: 13.6 % (ref 11.5–15.5)
WBC: 6.4 10*3/uL (ref 4.0–10.5)

## 2016-02-14 LAB — BASIC METABOLIC PANEL
Anion gap: 10 (ref 5–15)
BUN: 43 mg/dL — AB (ref 6–20)
CHLORIDE: 101 mmol/L (ref 101–111)
CO2: 26 mmol/L (ref 22–32)
CREATININE: 1.6 mg/dL — AB (ref 0.61–1.24)
Calcium: 8.9 mg/dL (ref 8.9–10.3)
GFR calc non Af Amer: 38 mL/min — ABNORMAL LOW (ref >60)
GFR, EST AFRICAN AMERICAN: 45 mL/min — AB (ref >60)
Glucose, Bld: 187 mg/dL — ABNORMAL HIGH (ref 65–99)
POTASSIUM: 3.4 mmol/L — AB (ref 3.5–5.1)
SODIUM: 137 mmol/L (ref 135–145)

## 2016-02-14 LAB — GLUCOSE, CAPILLARY
GLUCOSE-CAPILLARY: 112 mg/dL — AB (ref 65–99)
Glucose-Capillary: 198 mg/dL — ABNORMAL HIGH (ref 65–99)
Glucose-Capillary: 196 mg/dL — ABNORMAL HIGH (ref 65–99)
Glucose-Capillary: 201 mg/dL — ABNORMAL HIGH (ref 65–99)

## 2016-02-14 LAB — UREA NITROGEN, URINE: UREA NITROGEN UR: 288 mg/dL

## 2016-02-14 LAB — HEPARIN LEVEL (UNFRACTIONATED)
HEPARIN UNFRACTIONATED: 0.31 [IU]/mL (ref 0.30–0.70)
Heparin Unfractionated: 0.31 IU/mL (ref 0.30–0.70)
Heparin Unfractionated: 0.39 IU/mL (ref 0.30–0.70)

## 2016-02-14 LAB — HEMOGLOBIN A1C
Hgb A1c MFr Bld: 5.7 % — ABNORMAL HIGH (ref 4.8–5.6)
Mean Plasma Glucose: 117 mg/dL

## 2016-02-14 MED ORDER — LEVALBUTEROL HCL 1.25 MG/0.5ML IN NEBU
1.2500 mg | INHALATION_SOLUTION | Freq: Two times a day (BID) | RESPIRATORY_TRACT | Status: DC
  Administered 2016-02-14 – 2016-02-16 (×3): 1.25 mg via RESPIRATORY_TRACT
  Filled 2016-02-14 (×5): qty 0.5

## 2016-02-14 MED ORDER — DILTIAZEM HCL ER COATED BEADS 180 MG PO CP24
180.0000 mg | ORAL_CAPSULE | ORAL | Status: DC
  Administered 2016-02-15 – 2016-02-18 (×4): 180 mg via ORAL
  Filled 2016-02-14 (×4): qty 1

## 2016-02-14 MED ORDER — SODIUM CHLORIDE 0.9% FLUSH: 3.0000 mL | INTRAVENOUS | Status: DC | PRN

## 2016-02-14 MED ORDER — LEVALBUTEROL HCL 1.25 MG/0.5ML IN NEBU
1.2500 mg | INHALATION_SOLUTION | Freq: Four times a day (QID) | RESPIRATORY_TRACT | Status: DC | PRN
  Administered 2016-02-15 – 2016-02-18 (×4): 1.25 mg via RESPIRATORY_TRACT
  Filled 2016-02-14 (×3): qty 0.5

## 2016-02-14 MED ORDER — POTASSIUM CHLORIDE CRYS ER 20 MEQ PO TBCR
40.0000 meq | EXTENDED_RELEASE_TABLET | Freq: Once | ORAL | Status: AC
  Administered 2016-02-14: 40 meq via ORAL
  Filled 2016-02-14: qty 2

## 2016-02-14 MED ORDER — CARVEDILOL 12.5 MG PO TABS
12.5000 mg | ORAL_TABLET | Freq: Two times a day (BID) | ORAL | Status: DC
  Administered 2016-02-14 – 2016-02-18 (×8): 12.5 mg via ORAL
  Filled 2016-02-14 (×8): qty 1

## 2016-02-14 MED ORDER — SODIUM CHLORIDE 0.9% FLUSH: 3.0000 mL | Freq: Two times a day (BID) | INTRAVENOUS | Status: DC

## 2016-02-14 MED ORDER — SODIUM CHLORIDE 0.9 % IV SOLN: 250.0000 mL | INTRAVENOUS | Status: DC | PRN

## 2016-02-14 MED ORDER — ASPIRIN 81 MG PO CHEW
81.0000 mg | CHEWABLE_TABLET | ORAL | Status: AC
  Administered 2016-02-15: 81 mg via ORAL
  Filled 2016-02-14: qty 1

## 2016-02-14 MED ORDER — SODIUM CHLORIDE 0.9 % IV SOLN
INTRAVENOUS | Status: DC
  Administered 2016-02-15: 02:00:00 via INTRAVENOUS

## 2016-02-14 NOTE — Progress Notes (Signed)
PROGRESS NOTE    [name]  [id number] DOB: 1930/04/30 DOA: 02/12/2016 PCP: [name], MD   Chief Complaint  Patient presents with  . Shortness of Breath    Brief Narrative:  HPI on 02/12/2016 by Dr. Ivor Costa [name] is a 82 y.o. male with medical history significant of chronic combined systolic and diastolic CHF, hypertension, hyperlipidemia, diabetes mellitus, GERD, stroke, PAD, CAD, formal smoker, anemia, CKD-III, who presents with shortness of breath.  Patient states that she has been having shortness of breath for a week, which has been progressively getting worse. It is aggravated by exertion. Patient was on Lasix 20 mg every other days, which was increased to 40 mg daily by his PCP without significant help. Patient has mild dry cough, but no chest pain fever or chills. She does not have nausea, vomiting, abdominal pain, diarrhea, symptoms of UTI. No new unilateral weakness, vision change or hearing loss. He has bilateral leg edema. Of note, pt has right upper fracture wearing splint, has mild pain. Assessment & Plan   Acute respiratory failure with hypoxia likely secondary to CHF exacerbation -Patient noted to have oxygen saturations of 91% on room air however was placed on BiPAP in the emergency department, O2 sats improved to 93% -Currently stable, on nasal cannula  Acute on chronic combined systolic and diastolic heart failure -Chest x-ray showed degree of congestive heart failure -BNP 887 -Echocardiogram 10/22/2015 showed an EF of Q000111Q, grade 2 diastolic dysfunction -Possibly related to worsening coronary artery disease as patient does have elevated troponin -Cardiology consulted and appreciated -Plan for right and left heart cath once creatinine has improved -Patient currently not a candidate for ACEi/ARB/Entresto due to AK I and history of hyperkalemia -Continue diuresis, monitor daily weights, intake, output -Urine output 1300cc over past 24hrs,  weight down 6lbs since admission  Elevated troponin/NSTEMI -Troponin peaked at 1.05 -Continue IV heparin -Continue aspirin, statin -Plan for left heart cath once creatinine has improved  Acute on chronic kidney disease, stage III -Baseline creatinine 1.3-1.4 -Upon admission creatinine 1.91 -Creatinine currently 1.6 -Currently diuresing, continue to monitor BMP  Chronic normocytic anemia -Baseline hemoglobin approximate 10, currently 9.3 -Continue iron supplementation -Monitor CBC  Diabetes mellitus, type II -Continue insulin sliding scale, CBG monitoring  Hyperlipidemia -Continue statin  Hypertension -Continue Coreg, diltiazem, Isordil  Right humeral fracture status post fall -Currently in sling -Patient follows up with orthopedics -Continue pain control -Will consult PT/OT  GERD -Continue PPI  DVT Prophylaxis  heparin  Code Status: Full  Family Communication: None bedside  Disposition Plan: Admitted. Continue CHF treatment. Poss d/c to home when stable  Consultants Cardiology  Procedures  None  Antibiotics   Anti-infectives    None      Subjective:   [name] seen and examined today.  Patient states he's feeling better today. Denies chest pain or shortness of breath, abdominal pain, nausea or vomiting, diarrhea or constipation.  Objective:   Vitals:   02/14/16 0514 02/14/16 0746 02/14/16 0749 02/14/16 0935  BP: 119/73 (!) 115/49 (!) 106/53   Pulse: (!) 105 (!) 109    Resp: 20 20    Temp: 98.3 F (36.8 C) 98 F (36.7 C)    TempSrc: Oral Oral    SpO2: 97% 94%  100%  Weight:      Height:        Intake/Output Summary (Last 24 hours) at 02/14/16 1105 Last data filed at 02/14/16 0955  Gross per 24 hour  Intake  941.53 ml  Output             1300 ml  Net          -358.47 ml   Filed Weights   02/13/16 0014 02/13/16 0536 02/14/16 0500  Weight: 69 kg (152 lb 1.9 oz) 69 kg (152 lb 1.9 oz) 66.4 kg (146 lb 4.8 oz)     Exam  General: Well developed, well nourished, NAD, appears stated age  HEENT: NCAT, mucous membranes moist.   Cardiovascular: S1 S2 auscultated, 2/6SEM, RRR  Respiratory: Diminished breath sounds  Abdomen: Soft, nontender, nondistended, + bowel sounds  Extremities: warm dry without cyanosis clubbing. +LE edema  Neuro: AAOx3, nonfocal (patient does have a speech impediment at baseline)  Skin: Without rashes exudates or nodules, moderate ecchymosis noted on the right upper extremity and chest wall  Psych: Normal affect and demeanor with intact judgement and insight, pleasant   Data Reviewed: I have personally reviewed following labs and imaging studies  CBC:  Recent Labs Lab 02/12/16 2011 02/13/16 1550 02/14/16 0539  WBC 6.7 5.3 6.4  NEUTROABS 5.1  --   --   HGB 10.1* 9.3* 9.3*  HCT 29.8* 27.5* 28.2*  MCV 97.7 96.8 98.6  PLT 139* 125* 0000000*   Basic Metabolic Panel:  Recent Labs Lab 02/12/16 2011 02/13/16 0427 02/14/16 0539  NA 134* 135 137  K 4.4 3.5 3.4*  CL 101 102 101  CO2 22 21* 26  GLUCOSE 188* 132* 187*  BUN 50* 47* 43*  CREATININE 1.91* 1.74* 1.60*  CALCIUM 9.3 8.8* 8.9   GFR: Estimated Creatinine Clearance: 31 mL/min (by C-G formula based on SCr of 1.6 mg/dL (H)). Liver Function Tests:  Recent Labs Lab 02/12/16 2011  AST 21  ALT 13*  ALKPHOS 200*  BILITOT 0.7  PROT 6.6  ALBUMIN 3.5   No results for input(s): LIPASE, AMYLASE in the last 168 hours. No results for input(s): AMMONIA in the last 168 hours. Coagulation Profile:  Recent Labs Lab 02/13/16 0005  INR 1.15   Cardiac Enzymes:  Recent Labs Lab 02/12/16 2011 02/13/16 0005 02/13/16 0427 02/13/16 1103  TROPONINI 0.81* 0.78* 1.05* 0.98*   BNP (last 3 results) No results for input(s): PROBNP in the last 8760 hours. HbA1C:  Recent Labs  02/13/16 0427  HGBA1C 5.7*   CBG:  Recent Labs Lab 02/13/16 0047 02/13/16 1206 02/13/16 1643 02/13/16 2023 02/14/16 0745   GLUCAP 160* 158* 212* 156* 198*   Lipid Profile:  Recent Labs  02/13/16 0427  CHOL 109  HDL 39*  LDLCALC 56  TRIG 69  CHOLHDL 2.8   Thyroid Function Tests: No results for input(s): TSH, T4TOTAL, FREET4, T3FREE, THYROIDAB in the last 72 hours. Anemia Panel: No results for input(s): VITAMINB12, FOLATE, FERRITIN, TIBC, IRON, RETICCTPCT in the last 72 hours. Urine analysis:    Component Value Date/Time   COLORURINE YELLOW 11/15/2015 [place] 11/15/2015 1645   LABSPEC 1.020 11/15/2015 1645   PHURINE 5.0 11/15/2015 1645   GLUCOSEU NEGATIVE 11/15/2015 1645   HGBUR NEGATIVE 11/15/2015 [place] 11/15/2015 1645   KETONESUR NEGATIVE 11/15/2015 1645   PROTEINUR NEGATIVE 11/15/2015 1645   UROBILINOGEN 0.2 06/13/2013 1400   NITRITE NEGATIVE 11/15/2015 1645   LEUKOCYTESUR NEGATIVE 11/15/2015 1645   Sepsis Labs: @LABRCNTIP (procalcitonin:4,lacticidven:4)  ) Recent Results (from the past 240 hour(s))  MRSA PCR Screening     Status: None   Collection Time: 02/13/16 12:12 AM  Result Value Ref Range Status  MRSA by PCR NEGATIVE NEGATIVE Final    Comment:        The GeneXpert MRSA Assay (FDA approved for NASAL specimens only), is one component of a comprehensive MRSA colonization surveillance program. It is not intended to diagnose MRSA infection nor to guide or monitor treatment for MRSA infections.       Radiology Studies: Dg Chest Port 1 View  Result Date: 02/12/2016 CLINICAL DATA:  Shortness of Breath EXAM: PORTABLE CHEST 1 VIEW COMPARISON:  Chest radiograph June 13, 2013 and chest CT June 14, 2014 FINDINGS: There is interstitial edema with small bilateral pleural effusions. There is mild patchy alveolar opacity in the right lower lobe region. Heart is borderline prominent with mild pulmonary venous hypertension. No adenopathy. The patient has an aortic stent in place. Atherosclerotic calcification is also noted in the aorta. There is  advanced arthropathy in the right shoulder with bony remodeling, stable. IMPRESSION: Evidence of a degree of congestive heart failure. Question alveolar edema versus superimposed pneumonia right base. Stable cardiac silhouette. Stent in descending thoracic aorta. Aortic atherosclerosis noted. Electronically Signed   By: Lowella Grip III M.D.   On: 02/12/2016 20:59     Scheduled Meds: . aspirin EC  81 mg Oral q morning - 10a  . atorvastatin  20 mg Oral QPM  . carvedilol  6.25 mg Oral BID WC  . cholecalciferol  2,000 Units Oral Daily  . diltiazem  240 mg Oral BH-q7a  . diphenhydrAMINE  25 mg Oral QHS  . doxazosin  2 mg Oral Daily  . ferrous gluconate  324 mg Oral Daily  . fluticasone  1 spray Each Nare BID  . furosemide  40 mg Intravenous BID  . insulin aspart  0-5 Units Subcutaneous QHS  . insulin aspart  0-9 Units Subcutaneous TID WC  . isosorbide dinitrate  10 mg Oral TID  . levalbuterol  1.25 mg Nebulization BID  . loratadine  10 mg Oral Daily  . multivitamins with iron  1 tablet Oral Daily  . niacin  500 mg Oral Daily  . omega-3 acid ethyl esters  1 g Oral Daily  . pantoprazole  40 mg Oral Daily  . sodium chloride flush  3 mL Intravenous Q12H  . temazepam  30 mg Oral QHS   Continuous Infusions: . heparin 1,100 Units/hr (02/14/16 0236)     LOS: 2 days   Time Spent in minutes   30 minutes  Gaylord Seydel D.O. on 02/14/2016 at 11:05 AM  Between 7am to 7pm - Pager - [phone]  After 7pm go to [email or web address] - password [email or web address]  And look for the night coverage person covering for me after hours  [facility] Office  [phone]

## 2016-02-14 NOTE — Progress Notes (Signed)
Heart Failure Navigator Consult Note  Presentation: [name] is a 82 y.o. male with medical history significant of chronic combined systolic and diastolic CHF, hypertension, hyperlipidemia, diabetes mellitus, GERD, stroke, PAD, CAD, formal smoker, anemia, CKD-III, who presents with shortness of breath.  Patient states that she has been having shortness of breath for a week, which has been progressively getting worse. It is aggravated by exertion. Patient was on Lasix 20 mg every other days, which was increased to 40 mg daily by his PCP without significant help. Patient has mild dry cough, but no chest pain fever or chills. She does not have nausea, vomiting, abdominal pain, diarrhea, symptoms of UTI. No new unilateral weakness, vision change or hearing loss. He has bilateral leg edema. Of note, pt has right upper fracture wearing splint, has mild pain.  Past Medical History:  Diagnosis Date  . Anemia   . CHF (congestive heart failure) ([place]) 05/2013  . Degenerative disc disease, lumbar   . Diabetes mellitus, type 2 ([place])   . Dyspnea   . Flu Feb. 9, 2015   ?  Mild Heart Attack  . GERD (gastroesophageal reflux disease)   . H/O thoracic aortic aneurysm repair    Stent Graft (penetrating ulcer) - r/t staphylococcal infection  . High cholesterol   . History of tobacco use    50 pack year history, quit in 2002  . Hypertension   . Left carotid artery occlusion    moderate R carotid disease; L Common Carotid Bypassed during Arch Repair.  Marland Kitchen MRSA infection    s/p '02-no problems now  . Myocardial infarction Feb. 9, 2015   Mild Heart attack  . Osteoporosis, senile   . PAD (peripheral artery disease) (HCC)    s/p L Fem-Pop Bypass; R Iliac PTA  . Seasonal allergies   . Stroke ([place])   . Transfusion history    '02- back surgery    Social History   Social History  . Marital status: Widowed    Spouse name: N/A  . Number of children: 2  . Years of education: N/A   Occupational  History  . retired - Engineer, manufacturing systems    Social History Main Topics  . Smoking status: Former Smoker    Quit date: 09/24/2000  . Smokeless tobacco: Never Used  . Alcohol use No  . Drug use: No  . Sexual activity: Not Currently   Other Topics Concern  . None   Social History Narrative  . None    ECHO:Study Conclusions-- 10/22/15  - Left ventricle: The cavity size was mildly dilated. Wall   thickness was normal. Systolic function was mildly reduced. The   estimated ejection fraction was in the range of 45% to 50%.   Features are consistent with a pseudonormal left ventricular   filling pattern, with concomitant abnormal relaxation and   increased filling pressure (grade 2 diastolic dysfunction). - Aortic valve: There was mild regurgitation. - Mitral valve: There was mild regurgitation. - Pulmonary arteries: PA peak pressure: 44 mm Hg (S).  ------------------------------------------------------------------- Labs, prior tests, procedures, and surgery: Transthoracic echocardiography (05/10/2013).     EF was 50%.  ------------------------------------------------------------------- Study data:  Comparison was made to the study of 05/10/2013.  Study status:  Routine.  Procedure:  The patient reported no pain pre or post test. Transthoracic echocardiography. Image quality was adequate.  Study completion:  There were no complications. Transthoracic echocardiography.  M-mode, complete 2D, spectral Doppler, and color Doppler.  Birthdate:  Patient birthdate: 18-Apr-1930.  Age:  Patient is 82 yr old.  Sex:  Gender: male. BMI: 25.3 kg/m^2.  Blood pressure:     122/60  Patient status: Outpatient.  Study date:  Study date: 10/22/2015. Study time: 01:35 PM.  Location:  [facility]  BNP    Component Value Date/Time   BNP 887.3 (H) 02/12/2016 2011    ProBNP    Component Value Date/Time   PROBNP 3,921.0 (H) 06/13/2013 0409     Education Assessment and Provision:  Detailed  education and instructions provided on heart failure disease management including the following:  Signs and symptoms of Heart Failure When to call the physician Importance of daily weights Low sodium diet Fluid restriction Medication management Anticipated future follow-up appointments  Patient education given on each of the above topics.  Patient acknowledges understanding and acceptance of all instructions.  I know [name] from a previous admission and subsequent visits to the [facility].  He lives with son in [place].  He does weigh each day yet tells me that he did not call with recent weight gains.  I reviewed the importance of daily weights and how weight increases relate to the signs and symptoms of HF.   I reviewed a low sodium diet and high sodium foods to avoid.  He says that he avoids added salt and high sodium foods in general.  He does eat out occassionally.  He denies any issues getting or taking prescribed medications.    Education Materials:  "Living Better With Heart Failure" Booklet, Daily Weight Tracker Tool    High Risk Criteria for Readmission and/or Poor Patient Outcomes:  EF <30%-  No 45-50%  2 or more admissions in 6 months-2/6 mo  Difficult social situation- no  Demonstrates medication noncompliance-denies    Barriers of Care:  Knowledge and compliance  Discharge Planning:   Plans to return to home with son.  He ill benefit from [facility] for ongoing education, symptom recognition and compliance reinforcement

## 2016-02-14 NOTE — Telephone Encounter (Signed)
Nurse case manager from [facility] calling to confirm pt will have home health nurse and physical therapy. CB: [phone]

## 2016-02-14 NOTE — Progress Notes (Signed)
Patient Name: [name] Date of Encounter: 02/14/2016  Primary Cardiologist: Dr. [facility] Problem List     Principal Problem:   Acute on chronic combined systolic and diastolic CHF (congestive heart failure) ([place]) Active Problems:   Hypertension   High cholesterol   Diabetes mellitus, type 2 ([place])   NSTEMI, initial episode of care [facility])   Acute respiratory failure with hypoxia (HCC)   Elevated troponin   Acute renal failure superimposed on stage 3 chronic kidney disease (HCC)   Acute exacerbation of CHF (congestive heart failure) ([place])   Subjective   Initially sleeping as he says he slept poorly last night. Breathing significantly improved. Denies any chest discomfort or palpitations. Has been ambulating without difficulty. On 2L [place].    Inpatient Medications    Scheduled Meds: . aspirin EC  81 mg Oral q morning - 10a  . atorvastatin  20 mg Oral QPM  . carvedilol  6.25 mg Oral BID WC  . cholecalciferol  2,000 Units Oral Daily  . diltiazem  240 mg Oral BH-q7a  . diphenhydrAMINE  25 mg Oral QHS  . doxazosin  2 mg Oral Daily  . ferrous gluconate  324 mg Oral Daily  . fluticasone  1 spray Each Nare BID  . furosemide  40 mg Intravenous BID  . insulin aspart  0-5 Units Subcutaneous QHS  . insulin aspart  0-9 Units Subcutaneous TID WC  . isosorbide dinitrate  10 mg Oral TID  . levalbuterol  1.25 mg Nebulization BID  . loratadine  10 mg Oral Daily  . multivitamins with iron  1 tablet Oral Daily  . niacin  500 mg Oral Daily  . omega-3 acid ethyl esters  1 g Oral Daily  . pantoprazole  40 mg Oral Daily  . sodium chloride flush  3 mL Intravenous Q12H  . temazepam  30 mg Oral QHS   Continuous Infusions: . heparin 1,100 Units/hr (02/14/16 0236)   PRN Meds: sodium chloride, acetaminophen, ALPRAZolam, HYDROcodone-acetaminophen, levalbuterol, ondansetron (ZOFRAN) IV, polyethylene glycol, sodium chloride flush   Vital Signs    Vitals:   02/14/16 0514 02/14/16  0746 02/14/16 0749 02/14/16 0935  BP: 119/73 (!) 115/49 (!) 106/53   Pulse: (!) 105 (!) 109    Resp: 20 20    Temp: 98.3 F (36.8 C) 98 F (36.7 C)    TempSrc: Oral Oral    SpO2: 97% 94%  100%  Weight:      Height:        Intake/Output Summary (Last 24 hours) at 02/14/16 1108 Last data filed at 02/14/16 0955  Gross per 24 hour  Intake           941.53 ml  Output             1300 ml  Net          -358.47 ml   Filed Weights   02/13/16 0014 02/13/16 0536 02/14/16 0500  Weight: 152 lb 1.9 oz (69 kg) 152 lb 1.9 oz (69 kg) 146 lb 4.8 oz (66.4 kg)    Physical Exam    GEN: Well nourished, well developed, elderly Caucasian male appearing in no acute distress.  HEENT: Grossly normal.  Neck: Supple, no carotid bruits, or masses. JVD at 9cm. Cardiac: RRR, no murmurs, rubs, or gallops. No clubbing, cyanosis, edema.  Radials/DP/PT 2+ and equal bilaterally.  Respiratory:  Respirations regular and unlabored, clear to auscultation bilaterally. GI: Soft, nontender, nondistended, BS + x 4. MS:  no deformity or atrophy. Skin: warm and dry, no rash. Neuro:  Strength and sensation are intact. Psych: AAOx3.  Normal affect.  Labs    CBC  Recent Labs  02/12/16 2011 02/13/16 1550 02/14/16 0539  WBC 6.7 5.3 6.4  NEUTROABS 5.1  --   --   HGB 10.1* 9.3* 9.3*  HCT 29.8* 27.5* 28.2*  MCV 97.7 96.8 98.6  PLT 139* 125* 0000000*   Basic Metabolic Panel  Recent Labs  02/13/16 0427 02/14/16 0539  NA 135 137  K 3.5 3.4*  CL 102 101  CO2 21* 26  GLUCOSE 132* 187*  BUN 47* 43*  CREATININE 1.74* 1.60*  CALCIUM 8.8* 8.9   Liver Function Tests  Recent Labs  02/12/16 2011  AST 21  ALT 13*  ALKPHOS 200*  BILITOT 0.7  PROT 6.6  ALBUMIN 3.5   No results for input(s): LIPASE, AMYLASE in the last 72 hours. Cardiac Enzymes  Recent Labs  02/13/16 0005 02/13/16 0427 02/13/16 1103  TROPONINI 0.78* 1.05* 0.98*   BNP Invalid input(s): POCBNP D-Dimer No results for input(s): DDIMER  in the last 72 hours. Hemoglobin A1C  Recent Labs  02/13/16 0427  HGBA1C 5.7*   Fasting Lipid Panel  Recent Labs  02/13/16 0427  CHOL 109  HDL 39*  LDLCALC 56  TRIG 69  CHOLHDL 2.8   Thyroid Function Tests No results for input(s): TSH, T4TOTAL, T3FREE, THYROIDAB in the last 72 hours.  Invalid input(s): FREET3  Telemetry    NSR, HR in 80's to low-100's. Episodes of ventricular trigeminy  - Personally Reviewed  ECG    No new tracings.   Radiology    Dg Chest Port 1 View  Result Date: 02/12/2016 CLINICAL DATA:  Shortness of Breath EXAM: PORTABLE CHEST 1 VIEW COMPARISON:  Chest radiograph June 13, 2013 and chest CT June 14, 2014 FINDINGS: There is interstitial edema with small bilateral pleural effusions. There is mild patchy alveolar opacity in the right lower lobe region. Heart is borderline prominent with mild pulmonary venous hypertension. No adenopathy. The patient has an aortic stent in place. Atherosclerotic calcification is also noted in the aorta. There is advanced arthropathy in the right shoulder with bony remodeling, stable. IMPRESSION: Evidence of a degree of congestive heart failure. Question alveolar edema versus superimposed pneumonia right base. Stable cardiac silhouette. Stent in descending thoracic aorta. Aortic atherosclerosis noted. Electronically Signed   By: Lowella Grip III M.D.   On: 02/12/2016 20:59    Cardiac Studies   Echocardiogram: 09/2015 Study Conclusions  - Left ventricle: The cavity size was mildly dilated. Wall   thickness was normal. Systolic function was mildly reduced. The   estimated ejection fraction was in the range of 45% to 50%.   Features are consistent with a pseudonormal left ventricular   filling pattern, with concomitant abnormal relaxation and   increased filling pressure (grade 2 diastolic dysfunction). - Aortic valve: There was mild regurgitation. - Mitral valve: There was mild regurgitation. - Pulmonary  arteries: PA peak pressure: 44 mm Hg (S).  Patient Profile     82 yo male w/ PMH of chronic combined systolic and diastolic CHF (EF [id number] in 09/2015), HTN, HLD, PAD, and Type 2 DM who presented to [facility] ED on 02/12/2016 for worsening dyspnea with exertion. BNP at 887, troponin peak of 1.05. Admitted for diuresis and potential cath.   Assessment & Plan    1. Acute on Chronic Combined Systolic and Diastolic CHF - echo in 0000000 showed  an EF of 45-50% with Grade 2 DD. Presented with worsening dyspnea with exertion for the past week and weight gain of at least 4 lbs. BNP elevated to 887 on admission. - has been started on IV Lasix 40mg  BID with a reported net output of -691mL, however weight has gone down 6 lbs. Creatinine continues to trend down. Likely transition to PO Lasix today or tomorrow. - plan for [facility] tomorrow with his improved respiratory status.   2. NSTEMI - cyclic troponin values have been 0.78, 1.05, and 0.98. - plan is for a R/LHC once his respiratory status improves. Possibly tomorrow as he is below dry weight and does not appear significantly volume overloaded on physical exam.  - continue Heparin, ASA, statin, and BB. Has frequent ectopy on telemetry but BP hinders further titration of his BB. Consider reduction of Diltiazem and further titration of his BB.   3. Dyslipidemia - Lipid Panel this admission shows total cholesterol 109, HDL 39, and LDL 56. - continue Atorvastatin 20mg  daily.   4. HTN - BP well-controlled at 106/49 - 131/73 in the past 24 hours. - continue current medication regimen.  5. Stage 3 CKD - creatinine elevated to 1.91 on admission, improved to 1.60 today.  6. Hypokalemia - K+ 3.4, will replace.   Signed, Erma Heritage, PA  02/14/2016, 11:08 AM

## 2016-02-14 NOTE — Evaluation (Signed)
Physical Therapy Evaluation Patient Details Name: [name] MRN: [id number] DOB: Jul 21, 1930 Today's Date: 02/14/2016   History of Present Illness  Patient is a 82 y/o male with hx of CHF, HTN, CVA, PAD, DM, MI, Rt THA in 2003, chronic combined systolic and diastolic CHF presents with SOB. Found to have Acute respiratory failure with hypoxia likely secondary to CHF exacerbation  Clinical Impression  Patient presents with generalized weakness, impaired balance, decreased use of RUE due to it being in a sling and impaired mobility s/p above. Tolerated gait training with 1 UE on RW and Min guard assist. No DOE noted and Sp02 remained >93% on RA. Pt reports he will have help for 10 hours during the day while son works. Pt's ability to perform ADLs is limited due to RUE in sling. Will follow acutely to maximize independence and mobility prior to return home.    Follow Up Recommendations Home health PT;Supervision for mobility/OOB;Supervision/Assistance - 24 hour (continue HHPT)    Equipment Recommendations  None recommended by PT    Recommendations for Other Services       Precautions / Restrictions Precautions Precautions: Fall Required Braces or Orthoses: Sling Restrictions Weight Bearing Restrictions: No Other Position/Activity Restrictions: Has sling RUE secondary to recent fx.      Mobility  Bed Mobility               General bed mobility comments: Up in chair upon PT arrival.   Transfers Overall transfer level: Needs assistance Equipment used: Rolling walker (2 wheeled) Transfers: Sit to/from Stand Sit to Stand: Mod assist         General transfer comment: Assist to boost from chair with cues for hand placement/technique.  Ambulation/Gait Ambulation/Gait assistance: Min guard Ambulation Distance (Feet): 100 Feet Assistive device: Rolling walker (2 wheeled) Gait Pattern/deviations: Step-through pattern;Decreased stride length;Shuffle;Trunk flexed Gait  velocity: decreased Gait velocity interpretation: <1.8 ft/sec, indicative of risk for recurrent falls General Gait Details: Slow, shuffling like gait with 1 UE using RW. Some assist with guiding RW during turns. Ambulated on RA and Sp02 remained >93%.  Stairs            Wheelchair Mobility    Modified Rankin (Stroke Patients Only)       Balance Overall balance assessment: Needs assistance Sitting-balance support: Feet supported;No upper extremity supported Sitting balance-Leahy Scale: Fair     Standing balance support: During functional activity;Single extremity supported Standing balance-Leahy Scale: Poor Standing balance comment: Reliant on UE for support in standing.                              Pertinent Vitals/Pain Pain Assessment: No/denies pain    Home Living Family/patient expects to be discharged to:: Private residence Living Arrangements: Children (with son) Available Help at Discharge: Family;Personal care attendant Type of Home: House Home Access: Ramped entrance     Home Layout: One level Home Equipment: Environmental consultant - 2 wheels;Cane - single point;Shower seat      Prior Function Level of Independence: Needs assistance   Gait / Transfers Assistance Needed: Uses RW vs SPC for ambulation as RUE is in sling.  ADL's / Homemaking Assistance Needed: Has required assist from son for ADLs since RUE injury.        Hand Dominance   Dominant Hand: Right    Extremity/Trunk Assessment   Upper Extremity Assessment:  (RUE in sling)  Lower Extremity Assessment: Generalized weakness      Cervical / Trunk Assessment: Kyphotic  Communication   Communication: No difficulties  Cognition Arousal/Alertness: Awake/alert Behavior During Therapy: WFL for tasks assessed/performed Overall Cognitive Status: Within Functional Limits for tasks assessed                      General Comments      Exercises     Assessment/Plan     PT Assessment Patient needs continued PT services  PT Problem List Decreased strength;Decreased mobility;Decreased balance;Decreased activity tolerance;Decreased range of motion          PT Treatment Interventions DME instruction;Therapeutic activities;Gait training;Therapeutic exercise;Patient/family education;Balance training;Functional mobility training    PT Goals (Current goals can be found in the Care Plan section)  Acute Rehab PT Goals Patient Stated Goal: to go home PT Goal Formulation: With patient Time For Goal Achievement: 02/28/16 Potential to Achieve Goals: Good    Frequency Min 3X/week   Barriers to discharge        Co-evaluation               End of Session Equipment Utilized During Treatment: Gait belt;Other (comment) (sling) Activity Tolerance: Patient tolerated treatment well Patient left: in chair;with call bell/phone within reach Nurse Communication: Mobility status         Time: [id number] PT Time Calculation (min) (ACUTE ONLY): 32 min   Charges:   PT Evaluation $PT Eval Moderate Complexity: 1 Procedure PT Treatments $Gait Training: 8-22 mins   PT G Codes:        Marga Gramajo A Burak Zerbe 02/14/2016, 4:14 PM  Wray Kearns, [place], DPT [phone]

## 2016-02-14 NOTE — Progress Notes (Signed)
ANTICOAGULATION CONSULT NOTE -follow up Pharmacy Consult for heparin  Indication: chest pain/ACS  Allergies  Allergen Reactions  . Codeine Nausea And Vomiting    Patient Measurements: Height: 5\' 5"  (165.1 cm) Weight: 146 lb 4.8 oz (66.4 kg) IBW/kg (Calculated) : 61.5  Heparin dosing weight: 69 kg  Vital Signs: Temp: 98 F (36.7 C) (11/16 0746) Temp Source: Oral (11/16 0746) BP: 106/53 (11/16 0749) Pulse Rate: 109 (11/16 0746)  Labs:  Recent Labs  02/12/16 2011 02/13/16 0005 02/13/16 0427 02/13/16 1103 02/13/16 1550 02/14/16 0007 02/14/16 0539  HGB 10.1*  --   --   --  9.3*  --  9.3*  HCT 29.8*  --   --   --  27.5*  --  28.2*  PLT 139*  --   --   --  125*  --  128*  LABPROT  --  14.8  --   --   --   --   --   INR  --  1.15  --   --   --   --   --   HEPARINUNFRC  --   --   --   --  <0.10* 0.39 0.31  CREATININE 1.91*  --  1.74*  --   --   --  1.60*  TROPONINI 0.81* 0.78* 1.05* 0.98*  --   --   --     Estimated Creatinine Clearance: 31 mL/min (by C-G formula based on SCr of 1.6 mg/dL (H)).  Assessment: 82 y.o. male with CHF and elevated cardiac markers for heparin HL = 0.31 on 1100 units/hr, therapeutic level.  NSTEMI + troponins, Hgb down to 9.3, plts low /stable SCr improved to 1.6 today and cardiologist plans for R/[facility] tomorrow.   Goal of Therapy:  Heparin level 0.3-0.7 units/ml Monitor platelets by anticoagulation protocol: Yes   Plan:  Continue Heparin at current rate 1100 units/hr Recheck Heparin level tonight to confirm therapeutic Daily HL, CBC.   Nicole Cella, [place] Clinical Pharmacist Pager: [phone] or [phone]) Main Rx [phone] 02/14/2016,6:02 PM

## 2016-02-14 NOTE — Evaluation (Signed)
Occupational Therapy Evaluation Patient Details Name: [name] MRN: [id number] DOB: May 31, 1930 Today's Date: 02/14/2016    History of Present Illness Patient is a 82 y/o male with hx of CHF, HTN, CVA, PAD, DM, MI, Rt THA in 2003, chronic combined systolic and diastolic CHF presents with SOB. Found to have Acute respiratory failure with hypoxia likely secondary to CHF exacerbation   Clinical Impression   Pt reports he required assist with BADL since RUE fx a few weeks ago. Currently pt requires min assist for functional mobility and ADL. Pt tolerated gentle AROM to R elbow, wrist, hand. Pt planning to d/c home with family support. Pt would benefit from continued skilled OT to address established goals.    Follow Up Recommendations  No OT follow up;Supervision/Assistance - 24 hour    Equipment Recommendations  None recommended by OT    Recommendations for Other Services       Precautions / Restrictions Precautions Precautions: Fall Required Braces or Orthoses: Sling Restrictions Weight Bearing Restrictions: No Other Position/Activity Restrictions: Has sling RUE secondary to recent fx. No orders but maintained NWB on RUE.      Mobility Bed Mobility               General bed mobility comments: Pt OOB in chair upon arrival.  Transfers Overall transfer level: Needs assistance Equipment used: 1 person hand held assist Transfers: Sit to/from Stand Sit to Stand: Min assist         General transfer comment: Min assist to boost up from chair.     Balance Overall balance assessment: Needs assistance Sitting-balance support: Feet supported;No upper extremity supported Sitting balance-Leahy Scale: Fair     Standing balance support: No upper extremity supported;During functional activity Standing balance-Leahy Scale: Fair Standing balance comment: Able to stand at sink and complete grooming activity with min guard assist                             ADL Overall ADL's : Needs assistance/impaired Eating/Feeding: Set up;Sitting   Grooming: Min guard;Standing;Oral care   Upper Body Bathing: Minimal assitance;Sitting   Lower Body Bathing: Minimal assistance;Sit to/from stand   Upper Body Dressing : Minimal assistance;Sitting Upper Body Dressing Details (indicate cue type and reason): for sling management Lower Body Dressing: Minimal assistance;Sit to/from stand Lower Body Dressing Details (indicate cue type and reason): Pt able to pull up socks in sitting but anticipate he would need assist in standing due to balance Toilet Transfer: Minimal assistance;Ambulation;Regular Toilet (hand held assist) Toilet Transfer Details (indicate cue type and reason): Simulated by sit to stand from chair with functional mobility in room.         Functional mobility during ADLs: Minimal assistance (hand held assist) General ADL Comments: Educated pt on R digits and elbow ROM to prevent stiffness and for edema control. SpO2 mid-high 90s throughout session on RA.     Vision Vision Assessment?: No apparent visual deficits   Perception     Praxis      Pertinent Vitals/Pain Pain Assessment: No/denies pain     Hand Dominance Right   Extremity/Trunk Assessment Upper Extremity Assessment Upper Extremity Assessment: RUE deficits/detail RUE Deficits / Details: Full elbow, wrist, hand ROM. RUE: Unable to fully assess due to immobilization   Lower Extremity Assessment Lower Extremity Assessment: Defer to PT evaluation   Cervical / Trunk Assessment Cervical / Trunk Assessment: Kyphotic   Communication Communication Communication: No difficulties  Cognition Arousal/Alertness: Awake/alert Behavior During Therapy: WFL for tasks assessed/performed Overall Cognitive Status: Within Functional Limits for tasks assessed                     General Comments       Exercises Exercises: Other exercises Other Exercises Other  Exercises: AROM R elbow, wrist, digits x5 each.   Shoulder Instructions      Home Living Family/patient expects to be discharged to:: Private residence Living Arrangements: Children Available Help at Discharge: Family;Personal care attendant Type of Home: House Home Access: Ramped entrance     [place]: One level     Bathroom Shower/Tub: Tub/shower unit Shower/tub characteristics: Curtain Biochemist, clinical: [place]: Environmental consultant - 2 wheels;Cane - single point;Shower seat;Bedside commode          Prior Functioning/Environment Level of Independence: Needs assistance  Gait / Transfers Assistance Needed: Uses RW vs SPC for ambulation as RUE is in sling. ADL's / Homemaking Assistance Needed: Has required assist from son for ADLs since RUE injury.            OT Problem List: Decreased strength;Decreased range of motion;Decreased activity tolerance;Impaired balance (sitting and/or standing);Impaired UE functional use   OT Treatment/Interventions: Self-care/ADL training;Energy conservation;DME and/or AE instruction;Therapeutic activities;Patient/family education;Balance training    OT Goals(Current goals can be found in the care plan section) Acute Rehab OT Goals Patient Stated Goal: to go home OT Goal Formulation: With patient Time For Goal Achievement: 02/28/16 Potential to Achieve Goals: Good ADL Goals Pt Will Perform Upper Body Bathing: with supervision;sitting Pt Will Perform Lower Body Bathing: with min guard assist;sit to/from stand Pt Will Transfer to Toilet: with min guard assist;ambulating;bedside commode Pt Will Perform Tub/Shower Transfer: Tub transfer;with min guard assist;ambulating;shower seat  OT Frequency: Min 2X/week   Barriers to D/C:            Co-evaluation              End of Session Equipment Utilized During Treatment: Other (comment) (sling)  Activity Tolerance: Patient tolerated treatment well Patient left: in  chair;with call bell/phone within reach   Time: [id number] OT Time Calculation (min): 28 min Charges:  OT General Charges $OT Visit: 1 Procedure OT Evaluation $OT Eval Moderate Complexity: 1 Procedure OT Treatments $Self Care/Home Management : 8-22 mins G-Codes:     Binnie Kand M.S., OTR/L [phone]  02/14/2016, 5:02 PM

## 2016-02-14 NOTE — Care Management Note (Addendum)
Case Management Note  Patient Details  Name: [name] MRN: [id number] Date of Birth: [birth year]  Subjective/Objective: Pt presented for Acute on Chronic CHF. Initiated on IV Lasix.  Pt is from home and son lives with him. Per patient, he is active with a [facility] and unable to remember the name. He states Therapist, sports and [employer] are coming out. CM did call [facility] and the office was closed for lunch.                     Action/Plan: CM will call back to office to see which agency they set pt up with. If the plan continues to be home pt will need HHRN and PT resumption orders. CM to monitor for DME 02 home need.   Expected Discharge Date:                  Expected Discharge Plan:  [place]  In-House Referral:  NA  Discharge planning Services  CM Consult  Post Acute Care Choice:  Home Health, Resumption of Svcs/PTA Provider Choice offered to:   Patient  DME Arranged:    DME Agency:     HH Arranged:   RN, PT, OT HH Agency:   [facility]  Status of Service:  In process, will continue to follow  If discussed at Long Length of Stay Meetings, dates discussed:    Additional Comments: [place] 02-15-16 Jacqlyn Krauss, RN,BSN [phone] CM did speak with [facility] Liaison Jocelyn Lamer and the patient is active with them for PT/OT. Orders received via Dr. Silvestre Mesi Office [facility]. Pt will benefit from [facility] as well. [facility] is aware pt is hospitalized and the plan will be to return home once stable.[facility] to f/u at home as well. Pt will need HH Orders, PT/OT RN and F2F once stable for d/c. No further needs from CM at this time.  Bethena Roys, RN 02/14/2016, 12:35 PM

## 2016-02-14 NOTE — Progress Notes (Signed)
ANTICOAGULATION CONSULT NOTE -follow up Pharmacy Consult for heparin  Indication: chest pain/ACS  Allergies  Allergen Reactions  . Codeine Nausea And Vomiting    Patient Measurements: Height: 5\' 5"  (165.1 cm) Weight: 146 lb 4.8 oz (66.4 kg) IBW/kg (Calculated) : 61.5  Heparin dosing weight: 69 kg  Vital Signs: Temp: 98 F (36.7 C) (11/16 2134) Temp Source: Oral (11/16 2134) BP: 109/51 (11/16 2108) Pulse Rate: 98 (11/16 2108)  Labs:  Recent Labs  02/12/16 2011 02/13/16 0005 02/13/16 0427 02/13/16 1103  02/13/16 1550 02/14/16 0007 02/14/16 0539 02/14/16 2004  HGB 10.1*  --   --   --   --  9.3*  --  9.3*  --   HCT 29.8*  --   --   --   --  27.5*  --  28.2*  --   PLT 139*  --   --   --   --  125*  --  128*  --   LABPROT  --  14.8  --   --   --   --   --   --   --   INR  --  1.15  --   --   --   --   --   --   --   HEPARINUNFRC  --   --   --   --   < > <0.10* 0.39 0.31 0.31  CREATININE 1.91*  --  1.74*  --   --   --   --  1.60*  --   TROPONINI 0.81* 0.78* 1.05* 0.98*  --   --   --   --   --   < > = values in this interval not displayed.  Estimated Creatinine Clearance: 31 mL/min (by C-G formula based on SCr of 1.6 mg/dL (H)).  Assessment: 82 y.o. male with CHF and elevated cardiac markers for heparin, plan for cath tomorrow. Confirmatory heparin level = 0.31, still at low end goal range.   Goal of Therapy:  Heparin level 0.3-0.7 units/ml Monitor platelets by anticoagulation protocol: Yes   Plan:  Increase heparin rate slightly to 1150 units/hr to keep level stay in therapeutic range.  Maryanna Shape, PharmD, BCPS  Clinical Pharmacist  Pager: [phone]   02/14/2016,9:47 PM

## 2016-02-14 NOTE — Progress Notes (Signed)
[place] for heparin  Indication: chest pain/ACS  Allergies  Allergen Reactions  . Codeine Nausea And Vomiting    Patient Measurements: Height: 5\' 5"  (165.1 cm) Weight: 152 lb 1.9 oz (69 kg) IBW/kg (Calculated) : 61.5  Heparin dosing weight: 69 kg  Vital Signs: Temp: 98 F (36.7 C) (11/15 1500) Temp Source: Oral (11/15 2023) BP: 131/69 (11/15 2023) Pulse Rate: 102 (11/15 2023)  Labs:  Recent Labs  02/12/16 2011 02/13/16 0005 02/13/16 0427 02/13/16 1103 02/13/16 1550 02/14/16 0007  HGB 10.1*  --   --   --  9.3*  --   HCT 29.8*  --   --   --  27.5*  --   PLT 139*  --   --   --  125*  --   LABPROT  --  14.8  --   --   --   --   INR  --  1.15  --   --   --   --   HEPARINUNFRC  --   --   --   --  <0.10* 0.39  CREATININE 1.91*  --  1.74*  --   --   --   TROPONINI 0.81* 0.78* 1.05* 0.98*  --   --     Estimated Creatinine Clearance: 28.5 mL/min (by C-G formula based on SCr of 1.74 mg/dL (H)).  Assessment: 82 y.o. male with CHF and elevated cardiac markers for heparin  Goal of Therapy:  Heparin level 0.3-0.7 units/ml Monitor platelets by anticoagulation protocol: Yes   Plan:  Continue Heparin at current rate   Lovetta Condie, [place] Curb 02/14/2016,1:17 AM

## 2016-02-14 NOTE — Progress Notes (Signed)
02 BNC reduced to 1.5 liters, 02 sat mid 90s will continue to wean pt as tolerated.  Edward Qualia RN

## 2016-02-15 ENCOUNTER — Inpatient Hospital Stay (HOSPITAL_COMMUNITY): Payer: Medicare Other

## 2016-02-15 ENCOUNTER — Encounter (HOSPITAL_COMMUNITY)
Admission: EM | Disposition: A | Discharge status: Hospice | Payer: Self-pay | Source: Home / Self Care | Attending: Internal Medicine

## 2016-02-15 ENCOUNTER — Encounter (HOSPITAL_COMMUNITY): Payer: Self-pay | Admitting: Thoracic Surgery (Cardiothoracic Vascular Surgery)

## 2016-02-15 DIAGNOSIS — I509 Heart failure, unspecified: Secondary | ICD-10-CM

## 2016-02-15 DIAGNOSIS — I25110 Atherosclerotic heart disease of native coronary artery with unstable angina pectoris: Secondary | ICD-10-CM

## 2016-02-15 DIAGNOSIS — I739 Peripheral vascular disease, unspecified: Secondary | ICD-10-CM

## 2016-02-15 HISTORY — PX: CARDIAC CATHETERIZATION: SHX172

## 2016-02-15 HISTORY — PX: PERIPHERAL VASCULAR CATHETERIZATION: SHX172C

## 2016-02-15 LAB — BASIC METABOLIC PANEL
Anion gap: 12 (ref 5–15)
BUN: 48 mg/dL — AB (ref 6–20)
CHLORIDE: 103 mmol/L (ref 101–111)
CO2: 24 mmol/L (ref 22–32)
Calcium: 9.2 mg/dL (ref 8.9–10.3)
Creatinine, Ser: 1.73 mg/dL — ABNORMAL HIGH (ref 0.61–1.24)
GFR calc non Af Amer: 35 mL/min — ABNORMAL LOW (ref >60)
GFR, EST AFRICAN AMERICAN: 40 mL/min — AB (ref >60)
Glucose, Bld: 145 mg/dL — ABNORMAL HIGH (ref 65–99)
POTASSIUM: 3.6 mmol/L (ref 3.5–5.1)
SODIUM: 139 mmol/L (ref 135–145)

## 2016-02-15 LAB — GLUCOSE, CAPILLARY
GLUCOSE-CAPILLARY: 165 mg/dL — AB (ref 65–99)
GLUCOSE-CAPILLARY: 159 mg/dL — AB (ref 65–99)
GLUCOSE-CAPILLARY: 163 mg/dL — AB (ref 65–99)
GLUCOSE-CAPILLARY: 156 mg/dL — AB (ref 65–99)
Glucose-Capillary: 166 mg/dL — ABNORMAL HIGH (ref 65–99)

## 2016-02-15 LAB — CBC
HEMATOCRIT: 27.7 % — AB (ref 39.0–52.0)
HEMATOCRIT: 28.1 % — AB (ref 39.0–52.0)
HEMOGLOBIN: 9.3 g/dL — AB (ref 13.0–17.0)
Hemoglobin: 9.1 g/dL — ABNORMAL LOW (ref 13.0–17.0)
MCH: 32.5 pg (ref 26.0–34.0)
MCH: 31.9 pg (ref 26.0–34.0)
MCHC: 33.6 g/dL (ref 30.0–36.0)
MCHC: 32.4 g/dL (ref 30.0–36.0)
MCV: 96.9 fL (ref 78.0–100.0)
MCV: 98.6 fL (ref 78.0–100.0)
Platelets: 139 10*3/uL — ABNORMAL LOW (ref 150–400)
Platelets: 127 10*3/uL — ABNORMAL LOW (ref 150–400)
RBC: 2.86 MIL/uL — ABNORMAL LOW (ref 4.22–5.81)
RBC: 2.85 MIL/uL — ABNORMAL LOW (ref 4.22–5.81)
RDW: 13.5 % (ref 11.5–15.5)
RDW: 13.6 % (ref 11.5–15.5)
WBC: 6.5 10*3/uL (ref 4.0–10.5)
WBC: 5.9 10*3/uL (ref 4.0–10.5)

## 2016-02-15 LAB — POCT I-STAT 3, VENOUS BLOOD GAS (G3P V)
ACID-BASE DEFICIT: 4 mmol/L — AB (ref 0.0–2.0)
Acid-base deficit: 3 mmol/L — ABNORMAL HIGH (ref 0.0–2.0)
BICARBONATE: 21.8 mmol/L (ref 20.0–28.0)
Bicarbonate: 21.5 mmol/L (ref 20.0–28.0)
O2 Saturation: 41 %
O2 Saturation: 49 %
PCO2 VEN: 37.6 mmHg — AB (ref 44.0–60.0)
PCO2 VEN: 38.1 mmHg — AB (ref 44.0–60.0)
PH VEN: 7.361 (ref 7.250–7.430)
PH VEN: 7.371 (ref 7.250–7.430)
PO2 VEN: 24 mmHg — AB (ref 32.0–45.0)
PO2 VEN: 27 mmHg — AB (ref 32.0–45.0)
TCO2: 23 mmol/L (ref 0–100)
TCO2: 23 mmol/L (ref 0–100)

## 2016-02-15 LAB — POCT I-STAT 3, ART BLOOD GAS (G3+)
ACID-BASE DEFICIT: 4 mmol/L — AB (ref 0.0–2.0)
BICARBONATE: 20 mmol/L (ref 20.0–28.0)
O2 Saturation: 95 %
PCO2 ART: 32.5 mmHg (ref 32.0–48.0)
PO2 ART: 74 mmHg — AB (ref 83.0–108.0)
TCO2: 21 mmol/L (ref 0–100)
pH, Arterial: 7.398 (ref 7.350–7.450)

## 2016-02-15 LAB — POCT ACTIVATED CLOTTING TIME: ACTIVATED CLOTTING TIME: 114 s

## 2016-02-15 LAB — CREATININE, SERUM
CREATININE: 1.66 mg/dL — AB (ref 0.61–1.24)
GFR, EST AFRICAN AMERICAN: 42 mL/min — AB (ref >60)
GFR, EST NON AFRICAN AMERICAN: 37 mL/min — AB (ref >60)

## 2016-02-15 LAB — HEPARIN LEVEL (UNFRACTIONATED): HEPARIN UNFRACTIONATED: 0.41 [IU]/mL (ref 0.30–0.70)

## 2016-02-15 SURGERY — RIGHT/LEFT HEART CATH AND CORONARY ANGIOGRAPHY
Anesthesia: LOCAL

## 2016-02-15 MED ORDER — HEPARIN (PORCINE) IN NACL 2-0.9 UNIT/ML-% IJ SOLN
INTRAMUSCULAR | Status: DC | PRN
  Administered 2016-02-15: 1000 mL

## 2016-02-15 MED ORDER — SODIUM CHLORIDE 0.9% FLUSH
3.0000 mL | Freq: Two times a day (BID) | INTRAVENOUS | Status: DC
  Administered 2016-02-16 – 2016-02-17 (×3): 3 mL via INTRAVENOUS

## 2016-02-15 MED ORDER — LIDOCAINE HCL (PF) 1 % IJ SOLN
INTRAMUSCULAR | Status: AC
  Filled 2016-02-15: qty 30

## 2016-02-15 MED ORDER — HEPARIN (PORCINE) IN NACL 2-0.9 UNIT/ML-% IJ SOLN
INTRAMUSCULAR | Status: AC
  Filled 2016-02-15: qty 1000

## 2016-02-15 MED ORDER — LIDOCAINE HCL (PF) 1 % IJ SOLN
INTRAMUSCULAR | Status: DC | PRN
  Administered 2016-02-15: 20 mL

## 2016-02-15 MED ORDER — FENTANYL CITRATE (PF) 100 MCG/2ML IJ SOLN
INTRAMUSCULAR | Status: AC
  Filled 2016-02-15: qty 2

## 2016-02-15 MED ORDER — MORPHINE SULFATE (PF) 2 MG/ML IV SOLN: 2.0000 mg | INTRAVENOUS | Status: DC | PRN

## 2016-02-15 MED ORDER — IOPAMIDOL (ISOVUE-370) INJECTION 76%
INTRAVENOUS | Status: AC
  Filled 2016-02-15: qty 100

## 2016-02-15 MED ORDER — SODIUM CHLORIDE 0.9 % IV SOLN: INTRAVENOUS | Status: AC

## 2016-02-15 MED ORDER — MIDAZOLAM HCL 2 MG/2ML IJ SOLN
INTRAMUSCULAR | Status: AC
  Filled 2016-02-15: qty 2

## 2016-02-15 MED ORDER — HEPARIN SODIUM (PORCINE) 5000 UNIT/ML IJ SOLN
5000.0000 [IU] | Freq: Three times a day (TID) | INTRAMUSCULAR | Status: DC
  Administered 2016-02-15 – 2016-02-18 (×9): 5000 [IU] via SUBCUTANEOUS
  Filled 2016-02-15 (×9): qty 1

## 2016-02-15 MED ORDER — IOPAMIDOL (ISOVUE-370) INJECTION 76%
INTRAVENOUS | Status: DC | PRN
  Administered 2016-02-15: 90 mL via INTRA_ARTERIAL

## 2016-02-15 MED ORDER — SODIUM CHLORIDE 0.9 % IV SOLN: 250.0000 mL | INTRAVENOUS | Status: DC | PRN

## 2016-02-15 MED ORDER — MIDAZOLAM HCL 2 MG/2ML IJ SOLN
INTRAMUSCULAR | Status: DC | PRN
  Administered 2016-02-15: 1 mg via INTRAVENOUS

## 2016-02-15 MED ORDER — SODIUM CHLORIDE 0.9% FLUSH: 3.0000 mL | INTRAVENOUS | Status: DC | PRN

## 2016-02-15 MED ORDER — FENTANYL CITRATE (PF) 100 MCG/2ML IJ SOLN
INTRAMUSCULAR | Status: DC | PRN
  Administered 2016-02-15: 25 ug via INTRAVENOUS

## 2016-02-15 SURGICAL SUPPLY — 16 items
CATH INFINITI 5FR MULTPACK ANG (CATHETERS) ×1 IMPLANT
CATH SWAN GANZ 7F STRAIGHT (CATHETERS) ×1 IMPLANT
COVER PRB 48X5XTLSCP FOLD TPE (BAG) IMPLANT
COVER PROBE 5X48 (BAG) ×2
GUIDEWIRE 3MM J TIP .035 145 (WIRE) ×1 IMPLANT
KIT HEART LEFT (KITS) ×2 IMPLANT
PACK CARDIAC CATHETERIZATION (CUSTOM PROCEDURE TRAY) ×2 IMPLANT
PINNACLE LONG 5F 25CM (SHEATH) ×2
SHEATH INTROD PINNACLE 5F 25CM (SHEATH) IMPLANT
SHEATH PINNACLE 5F 10CM (SHEATH) ×1 IMPLANT
SHEATH PINNACLE 7F 10CM (SHEATH) ×1 IMPLANT
SYR MEDRAD MARK V 150ML (SYRINGE) ×2 IMPLANT
TRANSDUCER W/STOPCOCK (MISCELLANEOUS) ×3 IMPLANT
TUBING CIL FLEX 10 FLL-RA (TUBING) ×2 IMPLANT
WIRE EMERALD 3MM-J .035X260CM (WIRE) ×1 IMPLANT
WIRE HI TORQ VERSACORE-J 145CM (WIRE) ×1 IMPLANT

## 2016-02-15 NOTE — Progress Notes (Signed)
Site area: rt groin Site Prior to Removal:  Level 0 Pressure Applied For: 20 minutes Manual:   yes Patient Status During Pull:  stable Post Pull Site:  Level 0 Post Pull Instructions Given:  yes Post Pull Pulses Present: yes Dressing Applied:  Gauze, tegaderm Bedrest begins @ [id number] Comments:

## 2016-02-15 NOTE — Progress Notes (Signed)
PROGRESS NOTE    [name]  [id number] DOB: 1930/06/13 DOA: 02/12/2016 PCP: [name], MD   Chief Complaint  Patient presents with  . Shortness of Breath    Brief Narrative:  HPI on 02/12/2016 by Dr. Ivor Costa [name] is a 82 y.o. male with medical history significant of chronic combined systolic and diastolic CHF, hypertension, hyperlipidemia, diabetes mellitus, GERD, stroke, PAD, CAD, formal smoker, anemia, CKD-III, who presents with shortness of breath.  Patient states that she has been having shortness of breath for a week, which has been progressively getting worse. It is aggravated by exertion. Patient was on Lasix 20 mg every other days, which was increased to 40 mg daily by his PCP without significant help. Patient has mild dry cough, but no chest pain fever or chills. She does not have nausea, vomiting, abdominal pain, diarrhea, symptoms of UTI. No new unilateral weakness, vision change or hearing loss. He has bilateral leg edema. Of note, pt has right upper fracture wearing splint, has mild pain. Assessment & Plan   Acute respiratory failure with hypoxia likely secondary to CHF exacerbation -Patient noted to have oxygen saturations of 91% on room air however was placed on BiPAP in the emergency department, O2 sats improved to 93% -Currently stable, weaned off of nasal canula, O2 sats between 92-93%  Acute on chronic combined systolic and diastolic heart failure -Chest x-ray showed degree of congestive heart failure -BNP 887 -Echocardiogram 10/22/2015 showed an EF of [id number], grade 2 diastolic dysfunction -Possibly related to worsening coronary artery disease as patient does have elevated troponin -Cardiology consulted and appreciated -Plan for right and left heart cath once creatinine has improved -Patient currently not a candidate for ACEi/ARB/Entresto due to AK I and history of hyperkalemia -Continue to monitor daily weights, intake, output -Urine  output 1400cc over past 24hrs, weight down 8lbs since admission -lasix held   Elevated troponin/NSTEMI -Troponin peaked at 1.05 -Continue IV heparin -Continue aspirin, statin -Plan for heart cath today  Acute on chronic kidney disease, stage III -Baseline creatinine 1.3-1.4 -Upon admission creatinine 1.91 -Creatinine currently 1.7 -Currently receiving IVF, continue to monitor BMP  Chronic normocytic anemia -Baseline hemoglobin approximate 10, currently 9.3 -Continue iron supplementation -Monitor CBC  Diabetes mellitus, type II -Continue insulin sliding scale, CBG monitoring  Hyperlipidemia -Continue statin  Hypertension -Continue Coreg, diltiazem, Isordil  Right humeral fracture status post fall -Currently in sling -Patient follows up with orthopedics -Continue pain control -PT recommended HH  GERD -Continue PPI  DVT Prophylaxis  heparin  Code Status: Full  Family Communication: None bedside  Disposition Plan: Admitted. Continue CHF treatment. Cath today. Home when stable.  Consultants Cardiology  Procedures  None  Antibiotics   Anti-infectives    None      Subjective:   [name] seen and examined today.  Patient states he's feeling better today, has some mild shortness of breath. Denies chest pain, abdominal pain, nausea or vomiting, diarrhea or constipation.  Objective:   Vitals:   02/14/16 2108 02/14/16 2134 02/15/16 0306 02/15/16 0859  BP: (!) 109/51  116/90   Pulse: 98  (!) 127   Resp: (!) 29  (!) 25   Temp:  98 F (36.7 C) 98.1 F (36.7 C)   TempSrc:  Oral Oral   SpO2: 93% 92% 92% 93%  Weight:   65.7 kg (144 lb 14.4 oz)   Height:        Intake/Output Summary (Last 24 hours) at 02/15/16 1218 Last data  filed at 02/15/16 0900  Gross per 24 hour  Intake           513.56 ml  Output              950 ml  Net          -436.44 ml   Filed Weights   02/13/16 0536 02/14/16 0500 02/15/16 0306  Weight: 69 kg (152 lb 1.9 oz) 66.4 kg  (146 lb 4.8 oz) 65.7 kg (144 lb 14.4 oz)    Exam  General: Well developed, well nourished, NAD  HEENT: NCAT, mucous membranes moist.   Cardiovascular: S1 S2 auscultated, 2/6SEM, RRR  Respiratory: Diminished breath sounds  Abdomen: Soft, nontender, nondistended, + bowel sounds  Extremities: warm dry without cyanosis clubbing. +LE edema  Neuro: AAOx3, nonfocal (patient does have a speech impediment at baseline)  Skin: Without rashes exudates or nodules, moderate ecchymosis noted on the right upper extremity and chest wall  Psych: Appropriate mood and affect   Data Reviewed: I have personally reviewed following labs and imaging studies  CBC:  Recent Labs Lab 02/12/16 2011 02/13/16 1550 02/14/16 0539 02/15/16 0432  WBC 6.7 5.3 6.4 6.5  NEUTROABS 5.1  --   --   --   HGB 10.1* 9.3* 9.3* 9.3*  HCT 29.8* 27.5* 28.2* 27.7*  MCV 97.7 96.8 98.6 96.9  PLT 139* 125* 128* [id number]*   Basic Metabolic Panel:  Recent Labs Lab 02/12/16 2011 02/13/16 0427 02/14/16 0539 02/15/16 0432  NA 134* 135 137 139  K 4.4 3.5 3.4* 3.6  CL 101 102 101 103  CO2 22 21* 26 24  GLUCOSE 188* 132* 187* 145*  BUN 50* 47* 43* 48*  CREATININE 1.91* 1.74* 1.60* 1.73*  CALCIUM 9.3 8.8* 8.9 9.2   GFR: Estimated Creatinine Clearance: 28.1 mL/min (by C-G formula based on SCr of 1.73 mg/dL (H)). Liver Function Tests:  Recent Labs Lab 02/12/16 2011  AST 21  ALT 13*  ALKPHOS 200*  BILITOT 0.7  PROT 6.6  ALBUMIN 3.5   No results for input(s): LIPASE, AMYLASE in the last 168 hours. No results for input(s): AMMONIA in the last 168 hours. Coagulation Profile:  Recent Labs Lab 02/13/16 0005  INR 1.15   Cardiac Enzymes:  Recent Labs Lab 02/12/16 2011 02/13/16 0005 02/13/16 0427 02/13/16 1103  TROPONINI 0.81* 0.78* 1.05* 0.98*   BNP (last 3 results) No results for input(s): PROBNP in the last 8760 hours. HbA1C:  Recent Labs  02/13/16 0427  HGBA1C 5.7*   CBG:  Recent Labs Lab  02/14/16 1150 02/14/16 1716 02/14/16 2133 02/15/16 0732 02/15/16 1137  GLUCAP 112* 196* 201* 166* 165*   Lipid Profile:  Recent Labs  02/13/16 0427  CHOL 109  HDL 39*  LDLCALC 56  TRIG 69  CHOLHDL 2.8   Thyroid Function Tests: No results for input(s): TSH, T4TOTAL, FREET4, T3FREE, THYROIDAB in the last 72 hours. Anemia Panel: No results for input(s): VITAMINB12, FOLATE, FERRITIN, TIBC, IRON, RETICCTPCT in the last 72 hours. Urine analysis:    Component Value Date/Time   COLORURINE YELLOW 11/15/2015 [place] 11/15/2015 1645   LABSPEC 1.020 11/15/2015 1645   PHURINE 5.0 11/15/2015 [place] 11/15/2015 1645   HGBUR NEGATIVE 11/15/2015 1645   BILIRUBINUR NEGATIVE 11/15/2015 1645   KETONESUR NEGATIVE 11/15/2015 1645   PROTEINUR NEGATIVE 11/15/2015 1645   UROBILINOGEN 0.2 06/13/2013 1400   NITRITE NEGATIVE 11/15/2015 1645   LEUKOCYTESUR NEGATIVE 11/15/2015 1645   Sepsis Labs: @  LABRCNTIP(procalcitonin:4,lacticidven:4)  ) Recent Results (from the past 240 hour(s))  MRSA PCR Screening     Status: None   Collection Time: 02/13/16 12:12 AM  Result Value Ref Range Status   MRSA by PCR NEGATIVE NEGATIVE Final    Comment:        The GeneXpert MRSA Assay (FDA approved for NASAL specimens only), is one component of a comprehensive MRSA colonization surveillance program. It is not intended to diagnose MRSA infection nor to guide or monitor treatment for MRSA infections.       Radiology Studies: No results found.   Scheduled Meds: . [MAR Hold] aspirin EC  81 mg Oral q morning - 10a  . [MAR Hold] atorvastatin  20 mg Oral QPM  . [MAR Hold] carvedilol  12.5 mg Oral BID WC  . [MAR Hold] cholecalciferol  2,000 Units Oral Daily  . [MAR Hold] diltiazem  180 mg Oral BH-q7a  . [MAR Hold] diphenhydrAMINE  25 mg Oral QHS  . [MAR Hold] doxazosin  2 mg Oral Daily  . [MAR Hold] ferrous gluconate  324 mg Oral Daily  . [MAR Hold] fluticasone  1  spray Each Nare BID  . [MAR Hold] insulin aspart  0-5 Units Subcutaneous QHS  . [MAR Hold] insulin aspart  0-9 Units Subcutaneous TID WC  . [MAR Hold] isosorbide dinitrate  10 mg Oral TID  . [MAR Hold] levalbuterol  1.25 mg Nebulization BID  . [MAR Hold] loratadine  10 mg Oral Daily  . [MAR Hold] multivitamins with iron  1 tablet Oral Daily  . [MAR Hold] niacin  500 mg Oral Daily  . [MAR Hold] omega-3 acid ethyl esters  1 g Oral Daily  . [MAR Hold] pantoprazole  40 mg Oral Daily  . [MAR Hold] sodium chloride flush  3 mL Intravenous Q12H  . sodium chloride flush  3 mL Intravenous Q12H  . [MAR Hold] temazepam  30 mg Oral QHS   Continuous Infusions: . sodium chloride 50 mL/hr at 02/15/16 0210  . heparin 1,150 Units/hr (02/15/16 0210)     LOS: 3 days   Time Spent in minutes   30 minutes  Pernie Grosso D.O. on 02/15/2016 at 12:18 PM  Between 7am to 7pm - Pager - [phone]  After 7pm go to [email or web address] - password [email or web address]  And look for the night coverage person covering for me after hours  [facility] Office  [phone]

## 2016-02-15 NOTE — Consult Note (Signed)
   [facility] CM Inpatient Consult   02/15/2016  [name] 11-Jul-1930 [id number]   Patient evaluated for community based chronic disease management services for HF exacerbation with [facility] Care Management Program as a benefit of patient's Medicare Insurance.  Chart review reveals the patient, [name] is a 82 y.o.malewith medical history significant of chronic combined systolic and diastolic CHF, hypertension, hyperlipidemia, diabetes mellitus, GERD, stroke, PAD, CAD, formal smoker, anemia, CKD-III, whopresents with shortness of breath.Consent form signed.   Spoke with patient at bedside to explain [facility] Management services.  Patient will receive post hospital discharge call and will be evaluated for monthly home visits for assessments and disease process education.  Left contact information and [facility] literature at bedside. Made Inpatient Case Manager aware that [facility] Management following. Of note, [facility] Care Management services does not replace or interfere with any services that are arranged by inpatient case management or social work.  For additional questions or referrals please contact:     Natividad Brood, RN BSN [facility] Liaison  [phone] business mobile phone Toll free office [phone]

## 2016-02-15 NOTE — Telephone Encounter (Signed)
FYI

## 2016-02-15 NOTE — H&P (View-Only) (Signed)
DAILY PROGRESS NOTE  Subjective:  Remained tachycardic overnight, coreg increased yesterday evening to 12.5 mg BID. BP stable. Breathing well. No events overnight- now 1.6L negative. Creatinine up slightly today at 1.73 (From 1.6). H/H stable.  Objective:  Temp:  [98 F (36.7 C)-98.1 F (36.7 C)] 98.1 F (36.7 C) (11/17 0306) Pulse Rate:  [98-127] 127 (11/17 0306) Resp:  [25-29] 25 (11/17 0306) BP: (109-116)/(51-90) 116/90 (11/17 0306) SpO2:  [92 %-100 %] 92 % (11/17 0306) Weight:  [144 lb 14.4 oz (65.7 kg)] 144 lb 14.4 oz (65.7 kg) (11/17 0306) Weight change: -1 lb 6.4 oz (-0.635 kg)  Intake/Output from previous day: 11/16 0701 - 11/17 0700 In: 633.6 [P.O.:360; I.V.:273.6] Out: 1400 [Urine:1400]  Intake/Output from this shift: No intake/output data recorded.  Medications: No current facility-administered medications on file prior to encounter.    Current Outpatient Prescriptions on File Prior to Encounter  Medication Sig Dispense Refill  . amoxicillin (AMOXIL) 500 MG capsule Take 2,000 mg by mouth as needed (1 hour before dental appointments).   1  . aspirin EC 81 MG tablet Take 81 mg by mouth every morning.     Marland Kitchen atorvastatin (LIPITOR) 40 MG tablet Take 20 mg by mouth every evening.     Marland Kitchen CARTIA XT 240 MG 24 hr capsule Take 240 mg by mouth every morning.    . carvedilol (COREG) 6.25 MG tablet Take 6.25 mg by mouth 2 (two) times daily with a meal.    . Cholecalciferol (VITAMIN D) 2000 UNITS tablet Take 2,000 Units by mouth daily.    . diphenhydrAMINE (BENADRYL) 25 MG tablet Take 25 mg by mouth at bedtime.    Marland Kitchen doxazosin (CARDURA) 2 MG tablet Take 2 mg by mouth daily.     . Ferrous Gluconate (IRON) 246 (28 FE) MG TABS Take 2 tablets by mouth daily.    . fluticasone (FLONASE) 50 MCG/ACT nasal spray Place 1 spray into the nose 2 (two) times daily.     . furosemide (LASIX) 20 MG tablet Take 20 mg by mouth every other day. Take 1 tab every other day depending on weight gain     . glimepiride (AMARYL) 2 MG tablet Take 1 mg by mouth daily as needed (for blood sugar over 135 (1/8 of a 2 mg tablet)).     . isosorbide dinitrate (ISORDIL) 10 MG tablet Take 1 tablet (10 mg total) by mouth 3 (three) times daily. 90 tablet 2  . loratadine (CLARITIN) 10 MG tablet Take 10 mg by mouth daily.    . Multiple Vitamin (MULTIVITAMIN) capsule Take 1 capsule by mouth daily.    . niacin (SLO-NIACIN) 500 MG tablet Take 500 mg by mouth daily.     . Omega-3 Fatty Acids (FISH OIL) 1200 MG CAPS Take 4,800 mg by mouth every morning.     Marland Kitchen omeprazole (PRILOSEC) 20 MG capsule Take 20 mg by mouth daily.    . polyethylene glycol (MIRALAX / GLYCOLAX) packet Take 17 g by mouth every evening.     Marland Kitchen PREVIDENT 5000 BOOSTER PLUS 1.1 % PSTE Take 1 application by mouth every evening.  Use once in place of toothpaste brush 2 minutes then spit. DO NOT RINSE  0  . temazepam (RESTORIL) 30 MG capsule Take 30 mg by mouth at bedtime. For sleep    . HYDROcodone-acetaminophen (NORCO/VICODIN) 5-325 MG tablet Take 1 tablet by mouth every 6 (six) hours. (Patient not taking: Reported on 02/12/2016) 30 tablet 0  Physical Exam: General appearance: alert and no distress Lungs: clear to auscultation bilaterally Heart: regular rate and rhythm Extremities: extremities normal, atraumatic, no cyanosis or edema Neurologic: Grossly normal  Lab Results: Results for orders placed or performed during the hospital encounter of 02/12/16 (from the past 48 hour(s))  Troponin I (q 6hr x 3)     Status: Abnormal   Collection Time: 02/13/16 11:03 AM  Result Value Ref Range   Troponin I 0.98 (HH) <0.03 ng/mL    Comment: CRITICAL VALUE NOTED.  VALUE IS CONSISTENT WITH PREVIOUSLY REPORTED AND CALLED VALUE.  Glucose, capillary     Status: Abnormal   Collection Time: 02/13/16 12:06 PM  Result Value Ref Range   Glucose-Capillary 158 (H) 65 - 99 mg/dL   Comment 1 Notify RN    Comment 2 Document in Chart   Heparin level  (unfractionated)     Status: Abnormal   Collection Time: 02/13/16  3:50 PM  Result Value Ref Range   Heparin Unfractionated <0.10 (L) 0.30 - 0.70 IU/mL    Comment:        IF HEPARIN RESULTS ARE BELOW EXPECTED VALUES, AND PATIENT DOSAGE HAS BEEN CONFIRMED, SUGGEST FOLLOW UP TESTING OF ANTITHROMBIN III LEVELS. REPEATED TO VERIFY   CBC     Status: Abnormal   Collection Time: 02/13/16  3:50 PM  Result Value Ref Range   WBC 5.3 4.0 - 10.5 K/uL   RBC 2.84 (L) 4.22 - 5.81 MIL/uL   Hemoglobin 9.3 (L) 13.0 - 17.0 g/dL   HCT 27.5 (L) 39.0 - 52.0 %   MCV 96.8 78.0 - 100.0 fL   MCH 32.7 26.0 - 34.0 pg   MCHC 33.8 30.0 - 36.0 g/dL   RDW 13.6 11.5 - 15.5 %   Platelets 125 (L) 150 - 400 K/uL  Glucose, capillary     Status: Abnormal   Collection Time: 02/13/16  4:43 PM  Result Value Ref Range   Glucose-Capillary 212 (H) 65 - 99 mg/dL  Glucose, capillary     Status: Abnormal   Collection Time: 02/13/16  8:23 PM  Result Value Ref Range   Glucose-Capillary 156 (H) 65 - 99 mg/dL  Heparin level (unfractionated)     Status: None   Collection Time: 02/14/16 12:07 AM  Result Value Ref Range   Heparin Unfractionated 0.39 0.30 - 0.70 IU/mL    Comment:        IF HEPARIN RESULTS ARE BELOW EXPECTED VALUES, AND PATIENT DOSAGE HAS BEEN CONFIRMED, SUGGEST FOLLOW UP TESTING OF ANTITHROMBIN III LEVELS.   Basic metabolic panel     Status: Abnormal   Collection Time: 02/14/16  5:39 AM  Result Value Ref Range   Sodium 137 135 - 145 mmol/L   Potassium 3.4 (L) 3.5 - 5.1 mmol/L   Chloride 101 101 - 111 mmol/L   CO2 26 22 - 32 mmol/L   Glucose, Bld 187 (H) 65 - 99 mg/dL   BUN 43 (H) 6 - 20 mg/dL   Creatinine, Ser 1.60 (H) 0.61 - 1.24 mg/dL   Calcium 8.9 8.9 - 10.3 mg/dL   GFR calc non Af Amer 38 (L) >60 mL/min   GFR calc Af Amer 45 (L) >60 mL/min    Comment: (NOTE) The eGFR has been calculated using the CKD EPI equation. This calculation has not been validated in all clinical situations. eGFR's  persistently <60 mL/min signify possible Chronic Kidney Disease.    Anion gap 10 5 - 15  Heparin level (unfractionated)  Status: None   Collection Time: 02/14/16  5:39 AM  Result Value Ref Range   Heparin Unfractionated 0.31 0.30 - 0.70 IU/mL    Comment:        IF HEPARIN RESULTS ARE BELOW EXPECTED VALUES, AND PATIENT DOSAGE HAS BEEN CONFIRMED, SUGGEST FOLLOW UP TESTING OF ANTITHROMBIN III LEVELS.   CBC     Status: Abnormal   Collection Time: 02/14/16  5:39 AM  Result Value Ref Range   WBC 6.4 4.0 - 10.5 K/uL   RBC 2.86 (L) 4.22 - 5.81 MIL/uL   Hemoglobin 9.3 (L) 13.0 - 17.0 g/dL   HCT 28.2 (L) 39.0 - 52.0 %   MCV 98.6 78.0 - 100.0 fL   MCH 32.5 26.0 - 34.0 pg   MCHC 33.0 30.0 - 36.0 g/dL   RDW 13.6 11.5 - 15.5 %   Platelets 128 (L) 150 - 400 K/uL  Glucose, capillary     Status: Abnormal   Collection Time: 02/14/16  7:45 AM  Result Value Ref Range   Glucose-Capillary 198 (H) 65 - 99 mg/dL  Glucose, capillary     Status: Abnormal   Collection Time: 02/14/16 11:50 AM  Result Value Ref Range   Glucose-Capillary 112 (H) 65 - 99 mg/dL  Glucose, capillary     Status: Abnormal   Collection Time: 02/14/16  5:16 PM  Result Value Ref Range   Glucose-Capillary 196 (H) 65 - 99 mg/dL  Heparin level (unfractionated)     Status: None   Collection Time: 02/14/16  8:04 PM  Result Value Ref Range   Heparin Unfractionated 0.31 0.30 - 0.70 IU/mL    Comment:        IF HEPARIN RESULTS ARE BELOW EXPECTED VALUES, AND PATIENT DOSAGE HAS BEEN CONFIRMED, SUGGEST FOLLOW UP TESTING OF ANTITHROMBIN III LEVELS.   Glucose, capillary     Status: Abnormal   Collection Time: 02/14/16  9:33 PM  Result Value Ref Range   Glucose-Capillary 201 (H) 65 - 99 mg/dL  Basic metabolic panel     Status: Abnormal   Collection Time: 02/15/16  4:32 AM  Result Value Ref Range   Sodium 139 135 - 145 mmol/L   Potassium 3.6 3.5 - 5.1 mmol/L   Chloride 103 101 - 111 mmol/L   CO2 24 22 - 32 mmol/L   Glucose,  Bld 145 (H) 65 - 99 mg/dL   BUN 48 (H) 6 - 20 mg/dL   Creatinine, Ser 1.73 (H) 0.61 - 1.24 mg/dL   Calcium 9.2 8.9 - 10.3 mg/dL   GFR calc non Af Amer 35 (L) >60 mL/min   GFR calc Af Amer 40 (L) >60 mL/min    Comment: (NOTE) The eGFR has been calculated using the CKD EPI equation. This calculation has not been validated in all clinical situations. eGFR's persistently <60 mL/min signify possible Chronic Kidney Disease.    Anion gap 12 5 - 15  Heparin level (unfractionated)     Status: None   Collection Time: 02/15/16  4:32 AM  Result Value Ref Range   Heparin Unfractionated 0.41 0.30 - 0.70 IU/mL    Comment:        IF HEPARIN RESULTS ARE BELOW EXPECTED VALUES, AND PATIENT DOSAGE HAS BEEN CONFIRMED, SUGGEST FOLLOW UP TESTING OF ANTITHROMBIN III LEVELS.   CBC     Status: Abnormal   Collection Time: 02/15/16  4:32 AM  Result Value Ref Range   WBC 6.5 4.0 - 10.5 K/uL   RBC 2.86 (L) 4.22 -  5.81 MIL/uL   Hemoglobin 9.3 (L) 13.0 - 17.0 g/dL   HCT 27.7 (L) 39.0 - 52.0 %   MCV 96.9 78.0 - 100.0 fL   MCH 32.5 26.0 - 34.0 pg   MCHC 33.6 30.0 - 36.0 g/dL   RDW 13.5 11.5 - 15.5 %   Platelets 139 (L) 150 - 400 K/uL  Glucose, capillary     Status: Abnormal   Collection Time: 02/15/16  7:32 AM  Result Value Ref Range   Glucose-Capillary 166 (H) 65 - 99 mg/dL   Comment 1 Document in Chart     Imaging: No results found.  Assessment:  1. Principal Problem: 2.   Acute on chronic combined systolic and diastolic CHF (congestive heart failure) ([place]) 3. Active Problems: 4.   Hypertension 5.   High cholesterol 6.   Diabetes mellitus, type 2 ([place]) 7.   NSTEMI, initial episode of care ([place]) 8.   Acute respiratory failure with hypoxia ([facility]) 9.   Elevated troponin 10.   Acute renal failure superimposed on stage 3 chronic kidney disease ([place]) 11.   Acute exacerbation of CHF (congestive heart failure) ([place]) 12.   Plan:  1. Creatinine climbed slightly - may be at diuretic endpoint. Hold  lasix today. Hydrating for L/RHC today at 12 pm for NSTEMI and acute systolic congestive heart failure. May need to uptitrate b-blocker further this afternoon based on cath findings.  Time Spent Directly with Patient:  15 minutes  Length of Stay:  LOS: 3 days   Pixie Casino, MD, [facility] Attending Cardiologist [place] 02/15/2016, 8:09 AM

## 2016-02-15 NOTE — Consult Note (Addendum)
[address]       [place] [address]             [phone]          CARDIOTHORACIC SURGERY CONSULTATION REPORT  PCP is [name], MD Referring Provider is Pixie Casino, MD  Reason for consultation:  Multivessel CAD  HPI:  Patient is an 82 year old gentleman with complex past medical history who has been referred for surgical consultation to discuss treatment options for management of severe multivessel coronary artery disease with acute on chronic combined systolic and diastolic congestive heart failure.  He has a complex history of cerebrovascular disease and peripheral arterial disease which predates his cardiac history. He underwent right internal iliac artery stenting in 1997 and left femoral-popliteal bypass by Dr. Amedeo Plenty in the 1999.  In 2002 he underwent spine surgery and developed MRSA infection. Shortly after that he presented with a mycotic aneurysm of the innominate artery for which he underwent median sternotomy for resection of the innominate artery aneurysm with reconstruction of the aortic arch under total circulatory arrest using a Y graft taken off of the proximal ascending thoracic aorta with distal anastomosis to the innominate artery and the left common carotid artery. This procedure was performed by Dr. Cyndia Bent and Dr. Amedeo Plenty, and at the time the patient was noted to have extreme inflammation throughout the mediastinum consistent with an inflammatory mycotic aneurysm. Since then the patient has been documented to have chronic occlusion of the limb of the graft to the left common carotid artery, which remains chronically occluded at this time. The patient also later underwent endovascular repair of a complex descending thoracic aortic aneurysm at [facility].  More recently he has been followed by Dr. Oneida Alar for his vascular disease, who saw him last in November 2015.  He has been followed by Dr. Debara Pickett for hypertension and congestive heart  failure since 2011. He was hospitalized in 2015 with the flu and had a non-ST segment elevation myocardial infarction. Ejection fraction was 45% at that time. The patient had remained reasonably stable until July of this year when the patient began to complain of progressive exertional shortness of breath. Follow-up echocardiogram performed at that time revealed ejection fraction estimated 45-50% with grade 2 diastolic dysfunction, mild aortic insufficiency, mild mitral regurgitation, and mild to moderate pulmonary hypertension. A decision was made to treat the patient medically without proceeding with further workup for possible ischemic heart disease.  He suffered a mechanical fall on 01/25/2016 and was found to have a right proximal humerus fracture. He was treated with a sling and has been seen as an outpatient by Dr. Rush Farmer. Over this period of time the patient has developed further progression in symptoms of shortness of breath.  He was hospitalized 02/12/2016 with elevated troponin and BNP. Chest x-ray revealed volume overload and his weight was up 4 pounds at times of admission comparison with his last office visit.  EKG revealed sinus tachycardia without his acute ischemic changes.  Troponin levels peaked at 1.05.  Left and right heart catheterization was performed today by Dr. Ellyn Hack. The patient was found to have 50% stenosis of the left main coronary artery with 99% ostial stenosis of the left circumflex coronary artery and 100% chronic occlusion of the mid right coronary artery with left-to-right collaterals. Pulmonary artery pressures were 41/21 mmHg with mean pulmonary capillary wedge pressure 18 mmHg and left ventricular end-diastolic pressure 15 mmHg.  Fick cardiac output measured 3.79  L/m corresponding to a cardiac index of 2.2. Mixed venous oxygen saturation was only 47% although the patient has chronic anemia. Cardiothoracic surgical consultation was requested.  The patient has been retired  for more than 20 years and lives locally in [place] with his son. He has been slowing down physically for quite some time, although he has remained for the most part functionally independent. Up until recently he has still been driving an automobile and tending to basic chores. He admits to a long-standing history of progressive exertional shortness of breath and fatigue that has been gradually getting worse for quite some time now. His breathing has gotten worse since he fell and broke his arm 3 weeks ago, and at the time of admission he was short of breath at rest. He denies any history of chest pain or chest tightness either with activity or at rest. He has not had palpitations or dizzy spells. He has had some lower extremity edema. Appetite is fair but he has been having increasing problems with difficulty swallowing. Food occasionally gets stuck, and it seems as though this occurs in the proximal esophagus or perhaps at the level of the cricopharyngeus. He has had to cough food back up occasionally. He has had increased cough.  He has some problems with chronic constipation. His mobility is somewhat reduced and he uses a walker to get around the house. He states that he typically takes a cane with him when he goes out but he cannot stand on his feet for prolonged periods of time.  Past Medical History:  Diagnosis Date  . Anemia   . CHF (congestive heart failure) ([place]) 05/2013  . Degenerative disc disease, lumbar   . Diabetes mellitus, type 2 ([place])   . Dyspnea   . Flu Feb. 9, 2015   ?  Mild Heart Attack  . GERD (gastroesophageal reflux disease)   . H/O thoracic aortic aneurysm repair    Stent Graft (penetrating ulcer) - r/t staphylococcal infection  . High cholesterol   . History of tobacco use    50 pack year history, quit in 2002  . Hypertension   . Left carotid artery occlusion    moderate R carotid disease; L Common Carotid Bypassed during Arch Repair but graft occluded  . MRSA  infection    s/p '02-no problems now  . Myocardial infarction Feb. 9, 2015   Mild Heart attack  . Osteoporosis, senile   . PAD (peripheral artery disease) ([facility])    s/p L Fem-Pop Bypass; R Iliac PTA  . Seasonal allergies   . Stroke ([place])   . Transfusion history    '02- back surgery    Past Surgical History:  Procedure Laterality Date  . 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 BACK SURGERY  2001  . CLEFT LIP REPAIR    . COLONOSCOPY WITH PROPOFOL N/A 08/10/2012   Procedure: COLONOSCOPY WITH PROPOFOL;  Surgeon: Garlan Fair, MD;  Location: [facility];  Service: Endoscopy;  Laterality: N/A;  . ESOPHAGOGASTRODUODENOSCOPY (EGD) WITH PROPOFOL N/A 08/10/2012   Procedure: ESOPHAGOGASTRODUODENOSCOPY (EGD) WITH PROPOFOL;  Surgeon: Garlan Fair, MD;  Location: [facility];  Service: Endoscopy;  Laterality: N/A;  . HEMIARTHROPLASTY HIP Right   . ILIAC ARTERY STENT Right    PTA -STENT  . JOINT REPLACEMENT Right 2003  right hip   . PR VEIN BYPASS GRAFT,AORTO-FEM-POP  1999  . THORACIC AORTIC ENDOVASCULAR STENT GRAFT  2003   @ [facility]  - for Penetrating Ulcer/Aneurysm (cardiac cath done 01/08/2001 by Dr. Antionette Poles)  . TRANSTHORACIC ECHOCARDIOGRAM  10/2009   EF=>55%, LA mildly dilated, mild mitral annular calfication, mild MR, mild TR, RVSP 30-6mmHg, AV mildly sclerotic, trace pulm valve regurg  . VASCULAR SURGERY      Family History  Problem Relation Age of Onset  . Diabetes Mother   . Hypertension Mother     heart disese, died of MI at 8  . Hyperlipidemia Mother   . Heart disease Father     CHF  . Heart attack Father   . Arthritis Father   . Diabetes Sister     also Alzheimers  . Heart disease Son     Heart Disease before age 35  . Marfan syndrome Daughter   . AAA (abdominal aortic aneurysm) Son     repair in 20s  .  Marfan syndrome Son     Social History   Social History  . Marital status: Widowed    Spouse name: N/A  . Number of children: 2  . Years of education: N/A   Occupational History  . retired - Engineer, manufacturing systems    Social History Main Topics  . Smoking status: Former Smoker    Quit date: 09/24/2000  . Smokeless tobacco: Never Used  . Alcohol use No  . Drug use: No  . Sexual activity: Not Currently   Other Topics Concern  . Not on file   Social History Narrative  . No narrative on file    Prior to Admission medications   Medication Sig Start Date End Date Taking? Authorizing Provider  amoxicillin (AMOXIL) 500 MG capsule Take 2,000 mg by mouth as needed (1 hour before dental appointments).  03/02/15  Yes Historical Provider, MD  aspirin EC 81 MG tablet Take 81 mg by mouth every morning.    Yes Historical Provider, MD  atorvastatin (LIPITOR) 40 MG tablet Take 20 mg by mouth every evening.    Yes Historical Provider, MD  CARTIA XT 240 MG 24 hr capsule Take 240 mg by mouth every morning. 09/02/15  Yes Historical Provider, MD  carvedilol (COREG) 6.25 MG tablet Take 6.25 mg by mouth 2 (two) times daily with a meal.   Yes Historical Provider, MD  Cholecalciferol (VITAMIN D) 2000 UNITS tablet Take 2,000 Units by mouth daily.   Yes Historical Provider, MD  diphenhydrAMINE (BENADRYL) 25 MG tablet Take 25 mg by mouth at bedtime.   Yes Historical Provider, MD  doxazosin (CARDURA) 2 MG tablet Take 2 mg by mouth daily.  06/04/13  Yes Historical Provider, MD  Ferrous Gluconate (IRON) 246 (28 FE) MG TABS Take 2 tablets by mouth daily.   Yes Historical Provider, MD  fluticasone (FLONASE) 50 MCG/ACT nasal spray Place 1 spray into the nose 2 (two) times daily.    Yes Historical Provider, MD  furosemide (LASIX) 20 MG tablet Take 20 mg by mouth every other day. Take 1 tab every other day depending on weight gain 02/22/15  Yes Historical Provider, MD  glimepiride (AMARYL) 2 MG tablet Take 1 mg by mouth daily  as needed (for blood sugar over 135 (1/8 of a 2 mg tablet)).    Yes Historical Provider, MD  isosorbide dinitrate (ISORDIL) 10 MG tablet Take 1 tablet (10 mg total) by mouth 3 (three) times daily. 05/16/13  Yes Estela  Leonie Green, MD  loratadine (CLARITIN) 10 MG tablet Take 10 mg by mouth daily.   Yes Historical Provider, MD  Multiple Vitamin (MULTIVITAMIN) capsule Take 1 capsule by mouth daily.   Yes Historical Provider, MD  niacin (SLO-NIACIN) 500 MG tablet Take 500 mg by mouth daily.    Yes Historical Provider, MD  Omega-3 Fatty Acids (FISH OIL) 1200 MG CAPS Take 4,800 mg by mouth every morning.    Yes Historical Provider, MD  omeprazole (PRILOSEC) 20 MG capsule Take 20 mg by mouth daily.   Yes Historical Provider, MD  polyethylene glycol (MIRALAX / GLYCOLAX) packet Take 17 g by mouth every evening.    Yes Historical Provider, MD  PREVIDENT 5000 BOOSTER PLUS 1.1 % PSTE Take 1 application by mouth every evening.  Use once in place of toothpaste brush 2 minutes then spit. DO NOT RINSE 04/03/15  Yes Historical Provider, MD  temazepam (RESTORIL) 30 MG capsule Take 30 mg by mouth at bedtime. For sleep   Yes Historical Provider, MD  HYDROcodone-acetaminophen (NORCO/VICODIN) 5-325 MG tablet Take 1 tablet by mouth every 6 (six) hours. Patient not taking: Reported on 02/12/2016 01/25/16   Dorie Rank, MD    Current Facility-Administered Medications  Medication Dose Route Frequency Provider Last Rate Last Dose  . 0.9 %  sodium chloride infusion  250 mL Intravenous PRN Ivor Costa, MD      . 0.9 %  sodium chloride infusion  250 mL Intravenous PRN Leonie Man, MD      . 0.9 %  sodium chloride infusion   Intravenous Continuous Leonie Man, MD 100 mL/hr at 02/15/16 1430    . acetaminophen (TYLENOL) tablet 650 mg  650 mg Oral Q4H PRN Ivor Costa, MD      . ALPRAZolam Duanne Moron) tablet 0.25 mg  0.25 mg Oral BID PRN Ivor Costa, MD      . aspirin EC tablet 81 mg  81 mg Oral q morning - 10a Ivor Costa, MD   81  mg at 02/15/16 0959  . atorvastatin (LIPITOR) tablet 20 mg  20 mg Oral QPM Ivor Costa, MD   20 mg at 02/15/16 1704  . carvedilol (COREG) tablet 12.5 mg  12.5 mg Oral BID WC Pixie Casino, MD   12.5 mg at 02/15/16 1704  . cholecalciferol (VITAMIN D) tablet 2,000 Units  2,000 Units Oral Daily Ivor Costa, MD   2,000 Units at 02/14/16 [phone]  . diltiazem (CARDIZEM CD) 24 hr capsule 180 mg  180 mg Oral BH-q7a Pixie Casino, MD   180 mg at 02/15/16 [id number]  . diphenhydrAMINE (BENADRYL) capsule 25 mg  25 mg Oral QHS Ivor Costa, MD   25 mg at 02/14/16 2104  . doxazosin (CARDURA) tablet 2 mg  2 mg Oral Daily Ivor Costa, MD   2 mg at 02/15/16 0959  . ferrous gluconate (FERGON) tablet 324 mg  324 mg Oral Daily Ivor Costa, MD   324 mg at 02/14/16 0847  . fluticasone (FLONASE) 50 MCG/ACT nasal spray 1 spray  1 spray Each Nare BID Ivor Costa, MD   1 spray at 02/15/16 0959  . heparin injection 5,000 Units  5,000 Units Subcutaneous Q8H Leonie Man, MD      . HYDROcodone-acetaminophen (NORCO/VICODIN) 5-325 MG per tablet 1 tablet  1 tablet Oral Q6H PRN Carmin Muskrat, MD   1 tablet at 02/13/16 1213  . insulin aspart (novoLOG) injection 0-5 Units  0-5 Units Subcutaneous QHS Ivor Costa, MD  2 Units at 02/14/16 2230  . insulin aspart (novoLOG) injection 0-9 Units  0-9 Units Subcutaneous TID WC Ivor Costa, MD   2 Units at 02/15/16 1705  . isosorbide dinitrate (ISORDIL) tablet 10 mg  10 mg Oral TID Ivor Costa, MD   10 mg at 02/15/16 1700  . levalbuterol (XOPENEX) nebulizer solution 1.25 mg  1.25 mg Nebulization BID Maryann Mikhail, DO   1.25 mg at 02/14/16 2039  . levalbuterol (XOPENEX) nebulizer solution 1.25 mg  1.25 mg Nebulization Q6H PRN Maryann Mikhail, DO   1.25 mg at 02/15/16 0859  . loratadine (CLARITIN) tablet 10 mg  10 mg Oral Daily Ivor Costa, MD   10 mg at 02/15/16 0959  . morphine 2 MG/ML injection 2 mg  2 mg Intravenous Q1H PRN Leonie Man, MD      . multivitamins with iron tablet 1 tablet  1 tablet Oral  Daily Ivor Costa, MD   1 tablet at 02/14/16 0847  . niacin (SLO-NIACIN) CR tablet 500 mg  500 mg Oral Daily Ivor Costa, MD   500 mg at 02/14/16 0847  . omega-3 acid ethyl esters (LOVAZA) capsule 1 g  1 g Oral Daily Ivor Costa, MD   1 g at 02/15/16 0959  . ondansetron (ZOFRAN) injection 4 mg  4 mg Intravenous Q6H PRN Ivor Costa, MD      . pantoprazole (PROTONIX) EC tablet 40 mg  40 mg Oral Daily Ivor Costa, MD   40 mg at 02/15/16 0959  . polyethylene glycol (MIRALAX / GLYCOLAX) packet 17 g  17 g Oral Daily PRN Ivor Costa, MD      . sodium chloride flush (NS) 0.9 % injection 3 mL  3 mL Intravenous Q12H Ivor Costa, MD   3 mL at 02/14/16 2105  . sodium chloride flush (NS) 0.9 % injection 3 mL  3 mL Intravenous PRN Ivor Costa, MD      . sodium chloride flush (NS) 0.9 % injection 3 mL  3 mL Intravenous Q12H Leonie Man, MD      . sodium chloride flush (NS) 0.9 % injection 3 mL  3 mL Intravenous PRN Leonie Man, MD      . temazepam (RESTORIL) capsule 30 mg  30 mg Oral QHS Ivor Costa, MD   30 mg at 02/14/16 2104    Allergies  Allergen Reactions  . Codeine Nausea And Vomiting      Review of Systems:   General:  decreased appetite, decreased energy, + weight gain, no weight loss, no fever  Cardiac:  no chest pain with exertion, no chest pain at rest, + SOB with exertion, + resting SOB, no PND, no orthopnea, no palpitations, no arrhythmia, no atrial fibrillation, + LE edema, no dizzy spells, no syncope  Respiratory:  + shortness of breath, no home oxygen, + intermittent productive cough, no dry cough, no bronchitis, no wheezing, no hemoptysis, no asthma, no pain with inspiration or cough, no sleep apnea, no CPAP at night  GI:   + difficulty swallowing, no reflux, no frequent heartburn, no hiatal hernia, no abdominal pain, + constipation, no diarrhea, no hematochezia, no hematemesis, no melena  GU:   no dysuria,  no frequency, no urinary tract infection, no hematuria, no enlarged prostate, no kidney  stones, + kidney disease  Vascular:  no pain suggestive of claudication, no pain in feet, no leg cramps, no varicose veins, no DVT, no non-healing foot ulcer  Neuro:   + stroke, no TIA's, no  seizures, no headaches, no temporary blindness one eye,  + slurred speech, no peripheral neuropathy, no chronic pain, + instability of gait, no memory/cognitive dysfunction  Musculoskeletal: + arthritis - primarily involving the back, no joint swelling, no myalgias, some difficulty walking, decreased mobility   Skin:   no rash, no itching, no skin infections, no pressure sores or ulcerations  Psych:   no anxiety, no depression, no nervousness, no unusual recent stress  Eyes:   no blurry vision, no floaters, no recent vision changes, + wears glasses or contacts  ENT:   no hearing loss, no loose or painful teeth, no dentures  Hematologic:  no easy bruising, no abnormal bleeding, no clotting disorder, no frequent epistaxis  Endocrine:  + diabetes, does not check CBG's at home     Physical Exam:   BP 111/80   Pulse (!) 107   Temp 98.1 F (36.7 C) (Oral)   Resp (!) 25   Ht 5\' 5"  (1.651 m)   Wt 144 lb 14.4 oz (65.7 kg)   SpO2 91%   BMI 24.11 kg/m   General:  Elderly, frail-appearing  HEENT:  Unremarkable except for cleft palate repair, difficulty w/ speech  Neck:   no JVD, no bruits, no adenopathy   Chest:   Few scattered rales and rhonci, symmetrical breath sounds, no wheezes, well healed sternotomy scar  CV:   RRR, no murmur   Abdomen:  soft, non-tender, no masses   Extremities:  warm, well-perfused, pulses not palpable, no lower extremity edema  Rectal/GU  Deferred  Neuro:   Grossly non-focal and symmetrical throughout  Skin:   Clean and dry, no rashes, no breakdown  Diagnostic Tests:  Transthoracic Echocardiography  Patient:    [place], Suire MR #:       [id number] Study Date: 10/22/2015 Gender:     M Age:        10 Height:     162.6 cm Weight:     66.8 kg BSA:        1.75 m^2 Pt.  Status: Room:   ATTENDING    Dorris Carnes, M.D.  SONOGRAPHER  [place], [place]     Lyman Bishop MD  REFERRING    Lyman Bishop MD  PERFORMING   [facility], Outpatient  cc:  ------------------------------------------------------------------- LV EF: 45% -   50%  ------------------------------------------------------------------- Indications:      CHF (I50.32).  ------------------------------------------------------------------- History:   PMH:   Coronary artery disease.  Congestive heart failure.  Stroke.  PMH:   Myocardial infarction.  Risk factors: Carotid stenosis. PVD. Thrombocytopenia. Former tobacco use.  ------------------------------------------------------------------- Study Conclusions  - Left ventricle: The cavity size was mildly dilated. Wall   thickness was normal. Systolic function was mildly reduced. The   estimated ejection fraction was in the range of 45% to 50%.   Features are consistent with a pseudonormal left ventricular   filling pattern, with concomitant abnormal relaxation and   increased filling pressure (grade 2 diastolic dysfunction). - Aortic valve: There was mild regurgitation. - Mitral valve: There was mild regurgitation. - Pulmonary arteries: PA peak pressure: 44 mm Hg (S).  ------------------------------------------------------------------- Labs, prior tests, procedures, and surgery: Transthoracic echocardiography (05/10/2013).     EF was 50%.  ------------------------------------------------------------------- Study data:  Comparison was made to the study of 05/10/2013.  Study status:  Routine.  Procedure:  The patient reported no pain pre or post test. Transthoracic echocardiography. Image quality was adequate.  Study completion:  There were no complications. Transthoracic  echocardiography.  M-mode, complete 2D, spectral Doppler, and color Doppler.  Birthdate:  Patient birthdate: 1930/12/09.  Age:  Patient is 82 yr old.  Sex:   Gender: male. BMI: 25.3 kg/m^2.  Blood pressure:     122/60  Patient status: Outpatient.  Study date:  Study date: 10/22/2015. Study time: 01:35 PM.  Location:  [facility]  -------------------------------------------------------------------  ------------------------------------------------------------------- Left ventricle:  The cavity size was mildly dilated. Wall thickness was normal. Systolic function was mildly reduced. The estimated ejection fraction was in the range of 45% to 50%. Features are consistent with a pseudonormal left ventricular filling pattern, with concomitant abnormal relaxation and increased filling pressure (grade 2 diastolic dysfunction).  ------------------------------------------------------------------- Aortic valve:   Structurally normal valve.   Cusp separation was normal.  Doppler:  Transvalvular velocity was within the normal range. There was no stenosis. There was mild regurgitation.   ------------------------------------------------------------------- Mitral valve:   Mildly thickened leaflets .  Doppler:  There was mild regurgitation.    Peak gradient (D): 6 mm Hg.  ------------------------------------------------------------------- Left atrium:  The atrium was normal in size.  ------------------------------------------------------------------- Right ventricle:  The cavity size was normal. Wall thickness was normal. Systolic function was normal.  ------------------------------------------------------------------- Tricuspid valve:   Structurally normal valve.   Leaflet separation was normal.  Doppler:  Transvalvular velocity was within the normal range. There was trivial regurgitation.  ------------------------------------------------------------------- Right atrium:  The atrium was normal in size.  ------------------------------------------------------------------- Pericardium:  There was no pericardial  effusion.  ------------------------------------------------------------------- Systemic veins: Inferior vena cava: The vessel was normal in size. The respirophasic diameter changes were in the normal range (>= 50%), consistent with normal central venous pressure.  ------------------------------------------------------------------- Measurements   Left ventricle                           Value        Reference  LV ID, ED, PLAX chordal          (H)     52.7  mm     43 - 52  LV ID, ES, PLAX chordal          (H)     42.2  mm     23 - 38  LV fx shortening, PLAX chordal   (L)     20    %      >=29  LV PW thickness, ED                      8.59  mm     ---------  IVS/LV PW ratio, ED                      1            <=1.3  Stroke volume, 2D                        60    ml     ---------  Stroke volume/bsa, 2D                    34    ml/m^2 ---------  LV ejection fraction, 1-p A4C            52    %      ---------  LV end-diastolic volume, 2-p             102  ml     ---------  LV end-systolic volume, 2-p              56    ml     ---------  LV ejection fraction, 2-p                45    %      ---------  Stroke volume, 2-p                       46    ml     ---------  LV end-diastolic volume/bsa, 2-p         58    ml/m^2 ---------  LV end-systolic volume/bsa, 2-p          32    ml/m^2 ---------  Stroke volume/bsa, 2-p                   26.3  ml/m^2 ---------  LV e&', lateral                           9.79  cm/s   ---------  LV E/e&', lateral                         12.05        ---------  LV e&', medial                            5.03  cm/s   ---------  LV E/e&', medial                          23.46        ---------  LV e&', average                           7.41  cm/s   ---------  LV E/e&', average                         15.92        ---------    Ventricular septum                       Value        Reference  IVS thickness, ED                        8.59  mm     ---------    LVOT                                      Value        Reference  LVOT ID, S                               20    mm     ---------  LVOT area                                3.14  cm^2   ---------  LVOT peak velocity, S  80.5  cm/s   ---------  LVOT mean velocity, S                    49.6  cm/s   ---------  LVOT VTI, S                              19.2  cm     ---------    Aortic valve                             Value        Reference  Aortic regurg pressure half-time         517   ms     ---------    Aorta                                    Value        Reference  Aortic root ID, ED                       34    mm     ---------  Ascending aorta ID, A-P, S               31    mm     ---------    Left atrium                              Value        Reference  LA ID, A-P, ES                           43    mm     ---------  LA ID/bsa, A-P                   (H)     2.46  cm/m^2 <=2.2  LA volume, S                             64    ml     ---------  LA volume/bsa, S                         36.6  ml/m^2 ---------  LA volume, ES, 1-p A4C                   70    ml     ---------  LA volume/bsa, ES, 1-p A4C               40    ml/m^2 ---------  LA volume, ES, 1-p A2C                   56    ml     ---------  LA volume/bsa, ES, 1-p A2C               32    ml/m^2 ---------    Mitral valve                             Value  Reference  Mitral E-wave peak velocity              118   cm/s   ---------  Mitral A-wave peak velocity              99.7  cm/s   ---------  Mitral deceleration time                 151   ms     150 - 230  Mitral peak gradient, D                  6     mm Hg  ---------  Mitral E/A ratio, peak                   1.2          ---------    Pulmonary arteries                       Value        Reference  PA pressure, S, DP               (H)     44    mm Hg  <=30    Tricuspid valve                          Value        Reference  Tricuspid regurg peak velocity            301   cm/s   ---------  Tricuspid peak RV-RA gradient            36    mm Hg  ---------    Systemic veins                           Value        Reference  Estimated CVP                            8     mm Hg  ---------    Right ventricle                          Value        Reference  RV pressure, S, DP               (H)     44    mm Hg  <=30  RV s&', lateral, S                        10.5  cm/s   ---------    Pulmonic valve                           Value        Reference  Pulmonic regurg velocity, ED             90.3  cm/s   ---------  Legend: (L)  and  (H)  mark values outside specified reference range.  ------------------------------------------------------------------- Prepared and Electronically Authenticated by  Dorris Carnes, M.D. 2017-07-24T17:51:51    Abdominal Aortogram  Right/Left Heart Cath and Coronary Angiography  Conclusion     LV end diastolic pressure is mildly elevated. 15 mmHg.  PCWP T millimeters mercury.  Ost RCA to Mid RCA lesion, 100 %stenosed. Mid RCA to Dist RCA lesion, 65 %stenosed - filling retrograde via left to right collaterals  Ost LM to LM lesion, 55 %stenosed.  Ost Cx to Prox Cx lesion, 99 %stenosed.  Ost 1st Mrg to 1st Mrg lesion, 80 %stenosed.  Ost LAD to Mid LAD lesion, 15 %stenosed.    The patient has severe disease involving calcification of the left main with ostial circumflex disease as well as an occluded RCA that is filled via left to right collaterals. Mostly from the LAD. The LAD itself is actually relatively free of disease. Also noted is significant peripheral vascular disease with what looks like an occluded left iliac artery. 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 Free limited vascular access.  With his extent of disease, his best three-vessel relation option would be surgical with CABG, however he is likely not a very stable CABG candidate. His options are CABG (if it  provided is not option by the surgeons), palliative care/medical management, versus extremely high risk PCI which would likely require either orbital or rotational atherectomy and potentially bifurcation PCI of the left main-LAD-circumflex. He does not have many viable options for vascular access for hemodynamic support which would be required for on protected left main PCI as the sole remaining conduit.  I have reviewed the images with Dr. Debara Pickett and Dr. Angelena Form. I have asked Dr. Burt Knack to review the images. I think the best course of action would be to consult CT surgery for their opinion, after which the interventional community will need to discuss potential treatment options however I think PCI would not be an optimal option and palliative care/medical management is probably the best choice.  The patient will be returned to his nursing unit for ongoing care.    Glenetta Hew, M.D., M.S. Interventional Cardiologist   Pager # [phone] Phone # [phone] [address] [place], [place] [address]    Indications   NSTEMI (non-ST elevated myocardial infarction) ([facility]) [I21.4 (ICD-10-CM)]  Acute on chronic combined systolic and diastolic congestive heart failure (H[facility]) [I50.43 (ICD-10-CM)]  Peripheral vascular disease ([place]) [I73.9 (ICD-10-CM)]  Procedural Details/Technique   Technical Details PCP: [name], MD CARDIOLOGIST: Pixie Casino., MD  82 yo male w/ PMH of chronic combined systolic and diastolic CHF (EF [id number] in 09/2015), HTN, HLD, PAD, and Type 2 DM who presented to [facility] ED on 02/12/2016 for worsening dyspnea with exertion. BNP at 887, troponin peak of 1.05. Admitted for diuresis and now referred for right left heart catheterization. He has a history of a stent graft in the thoracic aorta as well as a stent in the right common iliac artery.  Time Out: Verified patient identification, verified procedure, site/side was marked, verified correct  patient position, special equipment/implants available, medications/allergies/relevent history reviewed, required imaging and test results available. Performed.   Access:  RIGHT Common Femoral Artery: 5 Fr 25 cm long Sheath - fluoroscopically guided modified Seldinger Technique (using Versicore wire); RIGHT Common Femoral Vein: 7 Fr Sheath - Seldinger Technique. -- Direct Ultrasound Guidance.   Right Heart Catheterization: 7 Fr Gordy Councilman catheter advanced under fluoroscopy with balloon inflated to the RA, RV, then PCWP-PA for hemodynamic measurement.  Simultaneous FA & PA blood gases checked for SaO2% to calculate FICK CO/CI  Thermodilution Injections performed to calculate CO/CI  Simultaneous PCWP/LV & RV/LV pressures monitored with Angled Pigtail in LV.  Catheter removed completely out  of the body with balloon deflated.  Left Heart Catheterization: 5Fr Catheters advanced or exchanged over a J-wire under direct fluoroscopic guidance into the ascending aorta; JR4 catheter advanced first.  LV Hemodynamics (LV Gram): JR4 Catheter Left Coronary Artery Cineangiography: JL4 Catheter  Right Coronary Artery Cineangiography: JR4 Catheter   After completing angiography, the decision was made to perform abdominal iliac angiography to determine potential access for hemodynamic support for high-risk PCI. A 5 French pigtail catheter was advanced over the Versicore wire into the descending aorta. An abdominal angiogram was performed demonstrating occluded left iliac artery was extreme tortuosity in the right iliac artery with minimal flow distally.  The catheter was then removed out of the body over wire.   Femoral Sheath(s) removed in the PACU holding area with manual pressure for hemostasis.    MEDICATIONS * 25 g IV fentanyl, 1 mgIV Versed * SQ Lidocaine 20 mL * Isovue Contrast: 90   Estimated blood loss <50 mL.  During this procedure the patient was administered the following to achieve and  maintain moderate conscious sedation: Versed 1 mg, Fentanyl 25 mcg, while the patient's heart rate, blood pressure, and oxygen saturation were continuously monitored. The period of conscious sedation was 60 minutes, of which I was present face-to-face 100% of this time.    Coronary Findings   Dominance: Right  Left Main  Vessel is large.  Ost LM to LM lesion, 55% stenosed. The lesion is discrete. The lesion is moderately calcified.  Left Anterior Descending  There is mild diffuse disease throughout the vessel.  Ost LAD to Mid LAD lesion, 15% stenosed. The lesion is moderately calcified.  First Diagonal Branch  Vessel is moderate in size. There is mild disease in the vessel.  First Septal Branch  Vessel is moderate in size.  Second Diagonal Branch  Vessel is small in size.  Second Septal Branch  Vessel is moderate in size.  Third Diagonal Branch  Vessel is small in size.  Third Septal Branch  Vessel is small in size.  Left Circumflex  Vessel is large.  Ost Cx to Prox Cx lesion, 99% stenosed. The lesion is located at the major branch. The lesion is severely calcified.  First Obtuse Marginal Branch  Ost 1st Mrg to 1st Mrg lesion, 80% stenosed. The lesion is eccentric.  Lateral First Obtuse Marginal Branch  Vessel is small in size.  Second Obtuse Marginal Branch  Vessel is moderate in size.  Lateral Second Obtuse Marginal Branch  Vessel is small in size.  Right Coronary Artery  Vessel is large.  Ost RCA to Mid RCA lesion, 100% stenosed. The lesion is chronically occluded with left-to-right collateral flow. The lesion is severely calcified.  Mid RCA to Dist RCA lesion, 65% stenosed. The lesion is severely calcified. Diffusely diseased  Acute Marginal Branch  Vessel is small in size.  Right Posterior Descending Artery  Vessel is moderate in size. There is mild disease in the vessel. RPDA filled by collaterals from 3rd Sept.  Inferior Septal  Vessel is small in size. Inf Sept  filled by collaterals from 1st Sept. Inf Sept filled by collaterals from 2nd Sept.  Right Posterior Atrioventricular Branch  Vessel is moderate in size. There is mild diffuse disease in the vessel.  First Right Posterolateral  Vessel is moderate in size. There is moderate disease in the vessel.  Second Right Posterolateral  Vessel is small in size. 2nd RPLB filled by collaterals from Lat 2nd Mrg.  Vascular Findings   Abdominal  Aorta Aortic Arch: The aortic arch was not visualized.   Descending Aorta: The descending aorta was not visualized.   Abdominal Aorta: The abdominal aorta was not visualized. The abdominal aorta is moderately dilated. Very tortuous vessel with a stent graft in the thoracic aorta. The distal aorta bifurcates into the left and right common iliac arteries, and the left common iliac is occluded.    Right Heart   Right Heart Pressures AO PRESSURE/MAP: 111/51/77 mmHg. LVP/EDP: 112/4/15 mmHg PAP/mean: 41/21/30 mmHg PCWP: 19/17/18 mmHg  AO sat 95%, PA sat 47%.  Cardiac Output/Index: Fick- 3.79/2.19; thermal 5.53/3.2    Right Atrium Right atrial pressure is elevated. 17/15/14 mmHg    Right Ventricle RVP/EDP: 35/8/11 mmHg    Wall Motion   LV gram not assessed        Left Heart   Left Ventricle LV end diastolic pressure is mildly elevated.    Aortic Valve There is no aortic valve stenosis.    Coronary Diagrams   Diagnostic Diagram     Implants     No implant documentation for this case.  PACS Images   Show images for Cardiac catheterization   Link to Procedure Log   Procedure Log    Hemo Data   Flowsheet Row Most Recent Value  Fick Cardiac Output 3.79 L/min  Fick Cardiac Output Index 2.19 (L/min)/BSA  Thermal Cardiac Output 5.53 L/min  Thermal Cardiac Output Index 3.2 (L/min)/BSA  RA A Wave 17 mmHg  RA V Wave 15 mmHg  RA Mean 14 mmHg  RV Systolic Pressure 35 mmHg  RV Diastolic Pressure 8 mmHg  RV EDP 11 mmHg  PA Systolic Pressure 41  mmHg  PA Diastolic Pressure 21 mmHg  PA Mean 30 mmHg  PW A Wave 19 mmHg  PW V Wave 17 mmHg  PW Mean 18 mmHg  AO Systolic Pressure 99991111 mmHg  AO Diastolic Pressure 51 mmHg  AO Mean 77 mmHg  LV Systolic Pressure 99991111 mmHg  LV Diastolic Pressure 8 mmHg  LV EDP 18 mmHg  Arterial Occlusion Pressure Extended Systolic Pressure [id number] mmHg  Arterial Occlusion Pressure Extended Diastolic Pressure 50 mmHg  Arterial Occlusion Pressure Extended Mean Pressure 75 mmHg  Left Ventricular Apex Extended Systolic Pressure [id number] mmHg  Left Ventricular Apex Extended Diastolic Pressure 4 mmHg  Left Ventricular Apex Extended EDP Pressure 15 mmHg  TPVR Index 9.39 HRUI  TSVR Index 24.09 HRUI  PVR SVR Ratio 0.19  TPVR/TSVR Ratio 0.39      Impression:  Patient has left main and three-vessel coronary artery disease with at least mild to moderate left ventricular dysfunction and presents with acute exacerbation of chronic combined systolic and diastolic congestive heart failure, [place] Heart Association functional class IV.  At the time of admission troponin levels were mildly elevated, consistent with acute non-ST segment elevation myocardial infarction.   I have personally reviewed the patient's diagnostic cardiac catheterization and the last previous echocardiogram which was performed in July of this year. Follow-up echocardiogram has been ordered but remains pending at this time. I have also personally reviewed the patient's most recent CT angiogram of his chest and neck, which was performed in 2015.  The patient has 50% stenosis of the left main coronary artery and 99% ostial stenosis of left circumflex coronary artery with 100% chronic occlusion of the right coronary artery. There is mild to moderate pulmonary hypertension although pulmonary capillary wedge pressure and left ventricular end-diastolic pressure were not elevated.  Cardiac output was normal although mixed  venous oxygen saturation was only 47% the  setting of chronic anemia and likely significant underlying chronic lung disease.  I agree the patient would likely benefit from coronary artery bypass grafting in terms of potential symptomatic improvement, decreased risk of future myocardial infarction, and somewhat improved long-term survival. However, I am afraid that risks associated with surgery are prohibitive in this elderly gentleman with multiple chronic medical problems and a complex surgical history.  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  There is considerable disease and calcification involving the ascending aorta and aortic arch.  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  Arterial cannulation could be problematic and it is quite possible that placement of an aortic cross-clamp would not be feasible.  Although it might be technically possible to perform coronary artery bypass grafting under cold fibrillatory arrest, the risks of stroke, respiratory failure, and renal failure would unquestionably be very high. Moreover, the patient's functional status has clearly declined substantially over recent months, and his ability to recover from such a procedure at his age would be questionable at best.  Unfortunately, given the patient's severe vascular disease I agree that an attempt at PCI of the high-grade stenosis of the left circumflex coronary artery is probably not feasible.   Plan:  I discussed matters at length with the patient in his hospital room this evening. We discussed the indications, risks, and potential benefits of coronary artery bypass grafting. We discussed the extremely high risks associated with surgery and prognosis with long-term medical therapy.    Under the circumstances, long-term medical therapy is probably the best option for this elderly gentleman. The patient understands this recommendation completely and is comfortable with that decision. All of his questions have been addressed.   I spent in excess of 120 minutes during the conduct of this hospital consultation and >50% of this time involved direct face-to-face encounter for counseling and/or coordination of the patient's care.    Valentina Gu. Roxy Manns, MD 02/15/2016 7:06 PM

## 2016-02-15 NOTE — Progress Notes (Signed)
OT Cancellation Note  Patient Details Name: [name] MRN: [id number] DOB: [birth year]   Cancelled Treatment:    Reason Eval/Treat Not Completed: Patient at procedure or test/ unavailable (cath lab). Will follow up as time allows.  Binnie Kand M.S., OTR/L Pager: [phone]  02/15/2016, 12:11 PM

## 2016-02-15 NOTE — Interval H&P Note (Signed)
History and Physical Interval Note:  02/15/2016 12:16 PM  [name]  has presented today for surgery, with the diagnosis of NSTEMI / Acute Combined HF  The various methods of treatment have been discussed with the patient and family. After consideration of risks, benefits and other options for treatment, the patient has consented to  Procedure(s): Right/Left Heart Cath and Coronary Angiography (N/A) with Possible Percutaneous Coronary Intervention as a surgical intervention .  The patient's history has been reviewed, patient examined, no change in status, stable for surgery.  I have reviewed the patient's chart and labs.  Questions were answered to the patient's satisfaction.    Cath Lab Visit (complete for each Cath Lab visit)  Clinical Evaluation Leading to the Procedure:   ACS: Yes.    Non-ACS:    Anginal Classification: CCS III  Anti-ischemic medical therapy: Maximal Therapy (2 or more classes of medications)  Non-Invasive Test Results: No non-invasive testing performed  Prior CABG: No previous CABG   Glenetta Hew

## 2016-02-15 NOTE — Progress Notes (Signed)
DAILY PROGRESS NOTE  Subjective:  Remained tachycardic overnight, coreg increased yesterday evening to 12.5 mg BID. BP stable. Breathing well. No events overnight- now 1.6L negative. Creatinine up slightly today at 1.73 (From 1.6). H/H stable.  Objective:  Temp:  [98 F (36.7 C)-98.1 F (36.7 C)] 98.1 F (36.7 C) (11/17 0306) Pulse Rate:  [98-127] 127 (11/17 0306) Resp:  [25-29] 25 (11/17 0306) BP: (109-116)/(51-90) 116/90 (11/17 0306) SpO2:  [92 %-100 %] 92 % (11/17 0306) Weight:  [144 lb 14.4 oz (65.7 kg)] 144 lb 14.4 oz (65.7 kg) (11/17 0306) Weight change: -1 lb 6.4 oz (-0.635 kg)  Intake/Output from previous day: 11/16 0701 - 11/17 0700 In: 633.6 [P.O.:360; I.V.:273.6] Out: 1400 [Urine:1400]  Intake/Output from this shift: No intake/output data recorded.  Medications: No current facility-administered medications on file prior to encounter.    Current Outpatient Prescriptions on File Prior to Encounter  Medication Sig Dispense Refill  . amoxicillin (AMOXIL) 500 MG capsule Take 2,000 mg by mouth as needed (1 hour before dental appointments).   1  . aspirin EC 81 MG tablet Take 81 mg by mouth every morning.     Marland Kitchen atorvastatin (LIPITOR) 40 MG tablet Take 20 mg by mouth every evening.     Marland Kitchen CARTIA XT 240 MG 24 hr capsule Take 240 mg by mouth every morning.    . carvedilol (COREG) 6.25 MG tablet Take 6.25 mg by mouth 2 (two) times daily with a meal.    . Cholecalciferol (VITAMIN D) 2000 UNITS tablet Take 2,000 Units by mouth daily.    . diphenhydrAMINE (BENADRYL) 25 MG tablet Take 25 mg by mouth at bedtime.    Marland Kitchen doxazosin (CARDURA) 2 MG tablet Take 2 mg by mouth daily.     . Ferrous Gluconate (IRON) 246 (28 FE) MG TABS Take 2 tablets by mouth daily.    . fluticasone (FLONASE) 50 MCG/ACT nasal spray Place 1 spray into the nose 2 (two) times daily.     . furosemide (LASIX) 20 MG tablet Take 20 mg by mouth every other day. Take 1 tab every other day depending on weight gain     . glimepiride (AMARYL) 2 MG tablet Take 1 mg by mouth daily as needed (for blood sugar over 135 (1/8 of a 2 mg tablet)).     . isosorbide dinitrate (ISORDIL) 10 MG tablet Take 1 tablet (10 mg total) by mouth 3 (three) times daily. 90 tablet 2  . loratadine (CLARITIN) 10 MG tablet Take 10 mg by mouth daily.    . Multiple Vitamin (MULTIVITAMIN) capsule Take 1 capsule by mouth daily.    . niacin (SLO-NIACIN) 500 MG tablet Take 500 mg by mouth daily.     . Omega-3 Fatty Acids (FISH OIL) 1200 MG CAPS Take 4,800 mg by mouth every morning.     Marland Kitchen omeprazole (PRILOSEC) 20 MG capsule Take 20 mg by mouth daily.    . polyethylene glycol (MIRALAX / GLYCOLAX) packet Take 17 g by mouth every evening.     Marland Kitchen PREVIDENT 5000 BOOSTER PLUS 1.1 % PSTE Take 1 application by mouth every evening.  Use once in place of toothpaste brush 2 minutes then spit. DO NOT RINSE  0  . temazepam (RESTORIL) 30 MG capsule Take 30 mg by mouth at bedtime. For sleep    . HYDROcodone-acetaminophen (NORCO/VICODIN) 5-325 MG tablet Take 1 tablet by mouth every 6 (six) hours. (Patient not taking: Reported on 02/12/2016) 30 tablet 0  Physical Exam: General appearance: alert and no distress Lungs: clear to auscultation bilaterally Heart: regular rate and rhythm Extremities: extremities normal, atraumatic, no cyanosis or edema Neurologic: Grossly normal  Lab Results: Results for orders placed or performed during the hospital encounter of 02/12/16 (from the past 48 hour(s))  Troponin I (q 6hr x 3)     Status: Abnormal   Collection Time: 02/13/16 11:03 AM  Result Value Ref Range   Troponin I 0.98 (HH) <0.03 ng/mL    Comment: CRITICAL VALUE NOTED.  VALUE IS CONSISTENT WITH PREVIOUSLY REPORTED AND CALLED VALUE.  Glucose, capillary     Status: Abnormal   Collection Time: 02/13/16 12:06 PM  Result Value Ref Range   Glucose-Capillary 158 (H) 65 - 99 mg/dL   Comment 1 Notify RN    Comment 2 Document in Chart   Heparin level  (unfractionated)     Status: Abnormal   Collection Time: 02/13/16  3:50 PM  Result Value Ref Range   Heparin Unfractionated <0.10 (L) 0.30 - 0.70 IU/mL    Comment:        IF HEPARIN RESULTS ARE BELOW EXPECTED VALUES, AND PATIENT DOSAGE HAS BEEN CONFIRMED, SUGGEST FOLLOW UP TESTING OF ANTITHROMBIN III LEVELS. REPEATED TO VERIFY   CBC     Status: Abnormal   Collection Time: 02/13/16  3:50 PM  Result Value Ref Range   WBC 5.3 4.0 - 10.5 K/uL   RBC 2.84 (L) 4.22 - 5.81 MIL/uL   Hemoglobin 9.3 (L) 13.0 - 17.0 g/dL   HCT 27.5 (L) 39.0 - 52.0 %   MCV 96.8 78.0 - 100.0 fL   MCH 32.7 26.0 - 34.0 pg   MCHC 33.8 30.0 - 36.0 g/dL   RDW 13.6 11.5 - 15.5 %   Platelets 125 (L) 150 - 400 K/uL  Glucose, capillary     Status: Abnormal   Collection Time: 02/13/16  4:43 PM  Result Value Ref Range   Glucose-Capillary 212 (H) 65 - 99 mg/dL  Glucose, capillary     Status: Abnormal   Collection Time: 02/13/16  8:23 PM  Result Value Ref Range   Glucose-Capillary 156 (H) 65 - 99 mg/dL  Heparin level (unfractionated)     Status: None   Collection Time: 02/14/16 12:07 AM  Result Value Ref Range   Heparin Unfractionated 0.39 0.30 - 0.70 IU/mL    Comment:        IF HEPARIN RESULTS ARE BELOW EXPECTED VALUES, AND PATIENT DOSAGE HAS BEEN CONFIRMED, SUGGEST FOLLOW UP TESTING OF ANTITHROMBIN III LEVELS.   Basic metabolic panel     Status: Abnormal   Collection Time: 02/14/16  5:39 AM  Result Value Ref Range   Sodium 137 135 - 145 mmol/L   Potassium 3.4 (L) 3.5 - 5.1 mmol/L   Chloride 101 101 - 111 mmol/L   CO2 26 22 - 32 mmol/L   Glucose, Bld 187 (H) 65 - 99 mg/dL   BUN 43 (H) 6 - 20 mg/dL   Creatinine, Ser 1.60 (H) 0.61 - 1.24 mg/dL   Calcium 8.9 8.9 - 10.3 mg/dL   GFR calc non Af Amer 38 (L) >60 mL/min   GFR calc Af Amer 45 (L) >60 mL/min    Comment: (NOTE) The eGFR has been calculated using the CKD EPI equation. This calculation has not been validated in all clinical situations. eGFR's  persistently <60 mL/min signify possible Chronic Kidney Disease.    Anion gap 10 5 - 15  Heparin level (unfractionated)  Status: None   Collection Time: 02/14/16  5:39 AM  Result Value Ref Range   Heparin Unfractionated 0.31 0.30 - 0.70 IU/mL    Comment:        IF HEPARIN RESULTS ARE BELOW EXPECTED VALUES, AND PATIENT DOSAGE HAS BEEN CONFIRMED, SUGGEST FOLLOW UP TESTING OF ANTITHROMBIN III LEVELS.   CBC     Status: Abnormal   Collection Time: 02/14/16  5:39 AM  Result Value Ref Range   WBC 6.4 4.0 - 10.5 K/uL   RBC 2.86 (L) 4.22 - 5.81 MIL/uL   Hemoglobin 9.3 (L) 13.0 - 17.0 g/dL   HCT 28.2 (L) 39.0 - 52.0 %   MCV 98.6 78.0 - 100.0 fL   MCH 32.5 26.0 - 34.0 pg   MCHC 33.0 30.0 - 36.0 g/dL   RDW 13.6 11.5 - 15.5 %   Platelets 128 (L) 150 - 400 K/uL  Glucose, capillary     Status: Abnormal   Collection Time: 02/14/16  7:45 AM  Result Value Ref Range   Glucose-Capillary 198 (H) 65 - 99 mg/dL  Glucose, capillary     Status: Abnormal   Collection Time: 02/14/16 11:50 AM  Result Value Ref Range   Glucose-Capillary 112 (H) 65 - 99 mg/dL  Glucose, capillary     Status: Abnormal   Collection Time: 02/14/16  5:16 PM  Result Value Ref Range   Glucose-Capillary 196 (H) 65 - 99 mg/dL  Heparin level (unfractionated)     Status: None   Collection Time: 02/14/16  8:04 PM  Result Value Ref Range   Heparin Unfractionated 0.31 0.30 - 0.70 IU/mL    Comment:        IF HEPARIN RESULTS ARE BELOW EXPECTED VALUES, AND PATIENT DOSAGE HAS BEEN CONFIRMED, SUGGEST FOLLOW UP TESTING OF ANTITHROMBIN III LEVELS.   Glucose, capillary     Status: Abnormal   Collection Time: 02/14/16  9:33 PM  Result Value Ref Range   Glucose-Capillary 201 (H) 65 - 99 mg/dL  Basic metabolic panel     Status: Abnormal   Collection Time: 02/15/16  4:32 AM  Result Value Ref Range   Sodium 139 135 - 145 mmol/L   Potassium 3.6 3.5 - 5.1 mmol/L   Chloride 103 101 - 111 mmol/L   CO2 24 22 - 32 mmol/L   Glucose,  Bld 145 (H) 65 - 99 mg/dL   BUN 48 (H) 6 - 20 mg/dL   Creatinine, Ser 1.73 (H) 0.61 - 1.24 mg/dL   Calcium 9.2 8.9 - 10.3 mg/dL   GFR calc non Af Amer 35 (L) >60 mL/min   GFR calc Af Amer 40 (L) >60 mL/min    Comment: (NOTE) The eGFR has been calculated using the CKD EPI equation. This calculation has not been validated in all clinical situations. eGFR's persistently <60 mL/min signify possible Chronic Kidney Disease.    Anion gap 12 5 - 15  Heparin level (unfractionated)     Status: None   Collection Time: 02/15/16  4:32 AM  Result Value Ref Range   Heparin Unfractionated 0.41 0.30 - 0.70 IU/mL    Comment:        IF HEPARIN RESULTS ARE BELOW EXPECTED VALUES, AND PATIENT DOSAGE HAS BEEN CONFIRMED, SUGGEST FOLLOW UP TESTING OF ANTITHROMBIN III LEVELS.   CBC     Status: Abnormal   Collection Time: 02/15/16  4:32 AM  Result Value Ref Range   WBC 6.5 4.0 - 10.5 K/uL   RBC 2.86 (L) 4.22 -  5.81 MIL/uL   Hemoglobin 9.3 (L) 13.0 - 17.0 g/dL   HCT 27.7 (L) 39.0 - 52.0 %   MCV 96.9 78.0 - 100.0 fL   MCH 32.5 26.0 - 34.0 pg   MCHC 33.6 30.0 - 36.0 g/dL   RDW 13.5 11.5 - 15.5 %   Platelets 139 (L) 150 - 400 K/uL  Glucose, capillary     Status: Abnormal   Collection Time: 02/15/16  7:32 AM  Result Value Ref Range   Glucose-Capillary 166 (H) 65 - 99 mg/dL   Comment 1 Document in Chart     Imaging: No results found.  Assessment:  1. Principal Problem: 2.   Acute on chronic combined systolic and diastolic CHF (congestive heart failure) ([place]) 3. Active Problems: 4.   Hypertension 5.   High cholesterol 6.   Diabetes mellitus, type 2 ([place]) 7.   NSTEMI, initial episode of care ([facility]) 8.   Acute respiratory failure with hypoxia ([facility]) 9.   Elevated troponin 10.   Acute renal failure superimposed on stage 3 chronic kidney disease ([employer]) 11.   Acute exacerbation of CHF (congestive heart failure) ([place]) 12.   Plan:  1. Creatinine climbed slightly - may be at diuretic endpoint. Hold  lasix today. Hydrating for L/RHC today at 12 pm for NSTEMI and acute systolic congestive heart failure. May need to uptitrate b-blocker further this afternoon based on cath findings.  Time Spent Directly with Patient:  15 minutes  Length of Stay:  LOS: 3 days   Pixie Casino, MD, [facility] Attending Cardiologist [place] 02/15/2016, 8:09 AM

## 2016-02-16 ENCOUNTER — Inpatient Hospital Stay (HOSPITAL_COMMUNITY): Payer: Medicare Other

## 2016-02-16 DIAGNOSIS — I509 Heart failure, unspecified: Secondary | ICD-10-CM

## 2016-02-16 DIAGNOSIS — I2510 Atherosclerotic heart disease of native coronary artery without angina pectoris: Secondary | ICD-10-CM

## 2016-02-16 DIAGNOSIS — R0902 Hypoxemia: Secondary | ICD-10-CM

## 2016-02-16 DIAGNOSIS — E785 Hyperlipidemia, unspecified: Secondary | ICD-10-CM

## 2016-02-16 DIAGNOSIS — E1169 Type 2 diabetes mellitus with other specified complication: Secondary | ICD-10-CM

## 2016-02-16 LAB — ECHOCARDIOGRAM COMPLETE
CHL CUP MV M VEL: 343
FS: 12 % — AB (ref 28–44)
Height: 65 in
IVS/LV PW RATIO, ED: 0.77
LA vol A4C: 68.6 ml
LA vol index: 35.3 mL/m2
LA vol: 62.2 mL
LADIAMINDEX: 2.39 cm/m2
LASIZE: 42 mm
LDCA: 3.14 cm2
LEFT ATRIUM END SYS DIAM: 42 mm
LV PW d: 11 mm — AB (ref 0.6–1.1)
LVOT diameter: 20 mm
MVANNULUSVTI: 119 cm
Mean grad: 55 mmHg
RV LATERAL S' VELOCITY: 8.3 cm/s
RV TAPSE: 14.3 mm
RV sys press: 37 mmHg
Reg peak vel: 291 cm/s
TDI e' medial: 6.85
TR max vel: 291 cm/s
WEIGHTICAEL: 2422.4 [oz_av]

## 2016-02-16 LAB — BASIC METABOLIC PANEL
Anion gap: 11 (ref 5–15)
BUN: 49 mg/dL — AB (ref 6–20)
CALCIUM: 9.2 mg/dL (ref 8.9–10.3)
CO2: 23 mmol/L (ref 22–32)
CREATININE: 1.8 mg/dL — AB (ref 0.61–1.24)
Chloride: 105 mmol/L (ref 101–111)
GFR calc non Af Amer: 33 mL/min — ABNORMAL LOW (ref >60)
GFR, EST AFRICAN AMERICAN: 38 mL/min — AB (ref >60)
GLUCOSE: 165 mg/dL — AB (ref 65–99)
Potassium: 4 mmol/L (ref 3.5–5.1)
Sodium: 139 mmol/L (ref 135–145)

## 2016-02-16 LAB — CBC
HCT: 28.8 % — ABNORMAL LOW (ref 39.0–52.0)
Hemoglobin: 9.4 g/dL — ABNORMAL LOW (ref 13.0–17.0)
MCH: 32.2 pg (ref 26.0–34.0)
MCHC: 32.6 g/dL (ref 30.0–36.0)
MCV: 98.6 fL (ref 78.0–100.0)
PLATELETS: 133 10*3/uL — AB (ref 150–400)
RBC: 2.92 MIL/uL — ABNORMAL LOW (ref 4.22–5.81)
RDW: 13.9 % (ref 11.5–15.5)
WBC: 7 10*3/uL (ref 4.0–10.5)

## 2016-02-16 LAB — GLUCOSE, CAPILLARY
GLUCOSE-CAPILLARY: 160 mg/dL — AB (ref 65–99)
Glucose-Capillary: 161 mg/dL — ABNORMAL HIGH (ref 65–99)
Glucose-Capillary: 247 mg/dL — ABNORMAL HIGH (ref 65–99)
Glucose-Capillary: 177 mg/dL — ABNORMAL HIGH (ref 65–99)

## 2016-02-16 MED ORDER — FUROSEMIDE 10 MG/ML IJ SOLN
40.0000 mg | Freq: Once | INTRAMUSCULAR | Status: AC
  Administered 2016-02-16: 40 mg via INTRAVENOUS
  Filled 2016-02-16: qty 4

## 2016-02-16 MED ORDER — FUROSEMIDE 40 MG PO TABS
40.0000 mg | ORAL_TABLET | Freq: Every day | ORAL | Status: DC
  Administered 2016-02-16 – 2016-02-18 (×3): 40 mg via ORAL
  Filled 2016-02-16 (×3): qty 1

## 2016-02-16 NOTE — Progress Notes (Signed)
Pt with increased WOB and RR of 30, HR 120s, O2 sat 84% on RA.  Lungs diminished with faint wheezing, pt agitated.  Placed back on 2L/[place], sats up to 94%.  Dr. Mliss Sax notified.  Orders received and implemented.  Will continue to monitor.  [name]

## 2016-02-16 NOTE — Progress Notes (Signed)
   02/16/16 0154  BiPAP/CPAP/SIPAP  BiPAP/CPAP/SIPAP Pt Type Adult  Mask Type Full face mask  Mask Size Large  Set Rate 8 breaths/min  Respiratory Rate 26 breaths/min  IPAP 12 cmH20  EPAP 6 cmH2O  Oxygen Percent 40 %  Minute Ventilation 16.5  Leak 39  Peak Inspiratory Pressure (PIP) 13  Tidal Volume (Vt) 479  BiPAP/CPAP/SIPAP BiPAP  Patient Home Equipment No  Press High Alarm 25 cmH2O  Press Low Alarm 5 cmH2O  BiPAP/CPAP /SiPAP Vitals  Pulse Rate (!) 108  Resp (!) 26  SpO2 100 %

## 2016-02-16 NOTE — Progress Notes (Signed)
  Echocardiogram 2D Echocardiogram has been performed.  [name] 02/16/2016, 5:09 PM

## 2016-02-16 NOTE — Significant Event (Signed)
Called by patient's nurse at 1:35 AM 02/16/16  Patient is hypoxic to 80s with crackles. He is no longer hypoxic on 2 LNC, but continues to have increased WOB and tachypnea. Rhythm is sinus tachycardia in the 120s. SBP is ~110.  I asked the nurse to place him on BIPAP Portable CXR pending Diuretics were held 11/17 due to the risk of contrast-induced nephropathy. LVEDP and PCWP from cath today appear to both be moderately elevated at ~18-20 mmHg. I decided to go ahead with Furosemide 40 mg IV x 1.   Soyla Murphy, MD Cardiology

## 2016-02-16 NOTE — Progress Notes (Signed)
PROGRESS NOTE    [name]  [id number] DOB: Nov 09, 1930 DOA: 02/12/2016 PCP: [name], MD   Chief Complaint  Patient presents with  . Shortness of Breath    Brief Narrative:  HPI on 02/12/2016 by Dr. Ivor Costa [name] is a 82 y.o. male with medical history significant of chronic combined systolic and diastolic CHF, hypertension, hyperlipidemia, diabetes mellitus, GERD, stroke, PAD, CAD, formal smoker, anemia, CKD-III, who presents with shortness of breath.  Patient states that she has been having shortness of breath for a week, which has been progressively getting worse. It is aggravated by exertion. Patient was on Lasix 20 mg every other days, which was increased to 40 mg daily by his PCP without significant help. Patient has mild dry cough, but no chest pain fever or chills. She does not have nausea, vomiting, abdominal pain, diarrhea, symptoms of UTI. No new unilateral weakness, vision change or hearing loss. He has bilateral leg edema. Of note, pt has right upper fracture wearing splint, has mild pain. Assessment & Plan   Acute respiratory failure with hypoxia likely secondary to CHF exacerbation -Patient noted to have oxygen saturations of 91% on room air however was placed on BiPAP in the emergency department, O2 sats improved to 93% -Currently stable, weaned off of nasal canula, O2 sats between 92-93% -Was placed on BiPAP overnight due to SOB  Acute on chronic combined systolic and diastolic heart failure -Chest x-ray showed degree of congestive heart failure -BNP 887 -Echocardiogram 10/22/2015 showed an EF of [id number], grade 2 diastolic dysfunction -Possibly related to worsening coronary artery disease as patient does have elevated troponin -Cardiology consulted and appreciated -Plan for right and left heart cath once creatinine has improved -Patient currently not a candidate for ACEi/ARB/Entresto due to AK I and history of hyperkalemia -Continue to  monitor daily weights, intake, output -Urine output 1100cc over past 24hrs, weight down 8lbs since admission -Patient given some fluid yesterday for AKI and Cath, had some shortness of breath overnight and received lasix IV -Continue lasix 40mg  PO daily  Elevated troponin/NSTEMI -Troponin peaked at 1.05 -Continue IV heparin -Continue aspirin, statin -s/p R/LHC: severe disease 50% calcified LM disease, 95% ostial LCX disease and occluded RCA with left to right collaterals -Patient not a candidate for surgery.  -Spoke with patient and son regarding code status and possibly Hospice.  Palliative care consulted.   Acute on chronic kidney disease, stage III -Baseline creatinine 1.3-1.4 -Upon admission creatinine 1.91 -Creatinine currently 1.8 -Continue to monitor BMP  Chronic normocytic anemia -Baseline hemoglobin approximate 10, currently 9.4 -Continue iron supplementation -Monitor CBC  Diabetes mellitus, type II -Continue insulin sliding scale, CBG monitoring  Hyperlipidemia -Continue statin  Hypertension -Continue Coreg, diltiazem, Isordil  Right humeral fracture status post fall -Currently in sling -Patient follows up with orthopedics -Continue pain control -PT recommended HH  GERD -Continue PPI  DVT Prophylaxis  heparin  Code Status:DNR  Family Communication: Son at  bedside  Disposition Plan: Admitted. Continue CHF treatment. Palliative care. Home with HHPT when stable  Consultants Cardiology Palliative care  Procedures  R/LHC  Antibiotics   Anti-infectives    None      Subjective:   [name] seen and examined today.  Patient feels his breathing has mildly improved. Was short of breath overnight. Denies currently chest pain, abdominal pain, nausea or vomiting, diarrhea or constipation.  Objective:   Vitals:   02/16/16 0154 02/16/16 0302 02/16/16 0530 02/16/16 0832  BP:   (!) 114/56  Pulse: (!) 108 100 100   Resp: (!) 26 (!) 23 (!) 21     Temp:   98.4 F (36.9 C)   TempSrc:   Oral   SpO2: 100%  97% 100%  Weight:   68.7 kg (151 lb 6.4 oz)   Height:        Intake/Output Summary (Last 24 hours) at 02/16/16 1003 Last data filed at 02/16/16 0710  Gross per 24 hour  Intake             1160 ml  Output             1150 ml  Net               10 ml   Filed Weights   02/14/16 0500 02/15/16 0306 02/16/16 0530  Weight: 66.4 kg (146 lb 4.8 oz) 65.7 kg (144 lb 14.4 oz) 68.7 kg (151 lb 6.4 oz)    Exam  General: Well developed, well nourished, NAD  HEENT: NCAT, mucous membranes moist.   Cardiovascular: S1 S2 auscultated, 2/6SEM, RRR  Respiratory: Diminished breath sounds, but clear  Abdomen: Soft, nontender, nondistended, + bowel sounds  Extremities: warm dry without cyanosis clubbing. +LE edema  Neuro: AAOx3, nonfocal (patient does have a speech impediment at baseline)  Psych: Appropriate mood and affect   Data Reviewed: I have personally reviewed following labs and imaging studies  CBC:  Recent Labs Lab 02/12/16 2011 02/13/16 1550 02/14/16 0539 02/15/16 0432 02/15/16 1521 02/16/16 0411  WBC 6.7 5.3 6.4 6.5 5.9 7.0  NEUTROABS 5.1  --   --   --   --   --   HGB 10.1* 9.3* 9.3* 9.3* 9.1* 9.4*  HCT 29.8* 27.5* 28.2* 27.7* 28.1* 28.8*  MCV 97.7 96.8 98.6 96.9 98.6 98.6  PLT 139* 125* 128* 139* 127* Q000111Q*   Basic Metabolic Panel:  Recent Labs Lab 02/12/16 2011 02/13/16 0427 02/14/16 0539 02/15/16 0432 02/15/16 1521 02/16/16 0411  NA 134* 135 137 139  --  139  K 4.4 3.5 3.4* 3.6  --  4.0  CL 101 102 101 103  --  105  CO2 22 21* 26 24  --  23  GLUCOSE 188* 132* 187* 145*  --  165*  BUN 50* 47* 43* 48*  --  49*  CREATININE 1.91* 1.74* 1.60* 1.73* 1.66* 1.80*  CALCIUM 9.3 8.8* 8.9 9.2  --  9.2   GFR: Estimated Creatinine Clearance: 27 mL/min (by C-G formula based on SCr of 1.8 mg/dL (H)). Liver Function Tests:  Recent Labs Lab 02/12/16 2011  AST 21  ALT 13*  ALKPHOS 200*  BILITOT 0.7   PROT 6.6  ALBUMIN 3.5   No results for input(s): LIPASE, AMYLASE in the last 168 hours. No results for input(s): AMMONIA in the last 168 hours. Coagulation Profile:  Recent Labs Lab 02/13/16 0005  INR 1.15   Cardiac Enzymes:  Recent Labs Lab 02/12/16 2011 02/13/16 0005 02/13/16 0427 02/13/16 1103  TROPONINI 0.81* 0.78* 1.05* 0.98*   BNP (last 3 results) No results for input(s): PROBNP in the last 8760 hours. HbA1C: No results for input(s): HGBA1C in the last 72 hours. CBG:  Recent Labs Lab 02/15/16 1137 02/15/16 1412 02/15/16 1657 02/15/16 2124 02/16/16 0743  GLUCAP 165* 159* 163* 156* 160*   Lipid Profile: No results for input(s): CHOL, HDL, LDLCALC, TRIG, CHOLHDL, LDLDIRECT in the last 72 hours. Thyroid Function Tests: No results for input(s): TSH, T4TOTAL, FREET4, T3FREE, THYROIDAB in the last 72  hours. Anemia Panel: No results for input(s): VITAMINB12, FOLATE, FERRITIN, TIBC, IRON, RETICCTPCT in the last 72 hours. Urine analysis:    Component Value Date/Time   COLORURINE YELLOW 11/15/2015 [place] 11/15/2015 1645   LABSPEC 1.020 11/15/2015 1645   PHURINE 5.0 11/15/2015 1645   GLUCOSEU NEGATIVE 11/15/2015 1645   HGBUR NEGATIVE 11/15/2015 1645   BILIRUBINUR NEGATIVE 11/15/2015 1645   KETONESUR NEGATIVE 11/15/2015 1645   PROTEINUR NEGATIVE 11/15/2015 1645   UROBILINOGEN 0.2 06/13/2013 1400   NITRITE NEGATIVE 11/15/2015 1645   LEUKOCYTESUR NEGATIVE 11/15/2015 1645   Sepsis Labs: @LABRCNTIP (procalcitonin:4,lacticidven:4)  ) Recent Results (from the past 240 hour(s))  MRSA PCR Screening     Status: None   Collection Time: 02/13/16 12:12 AM  Result Value Ref Range Status   MRSA by PCR NEGATIVE NEGATIVE Final    Comment:        The GeneXpert MRSA Assay (FDA approved for NASAL specimens only), is one component of a comprehensive MRSA colonization surveillance program. It is not intended to diagnose MRSA infection nor to guide  or monitor treatment for MRSA infections.       Radiology Studies: Dg Chest Port 1 View  Result Date: 02/16/2016 CLINICAL DATA:  Hypoxia EXAM: PORTABLE CHEST 1 VIEW COMPARISON:  02/12/2016 FINDINGS: Prior median sternotomy. Stent graft noted within the descending thoracic aorta. There is cardiomegaly with bilateral airspace opacities and small layering effusions. Findings compatible with mild CHF. IMPRESSION: Continued mild CHF pattern with small bilateral layering effusions. Electronically Signed   By: Rolm Baptise M.D.   On: 02/16/2016 07:46     Scheduled Meds: . aspirin EC  81 mg Oral q morning - 10a  . atorvastatin  20 mg Oral QPM  . carvedilol  12.5 mg Oral BID WC  . cholecalciferol  2,000 Units Oral Daily  . diltiazem  180 mg Oral [id number]  . diphenhydrAMINE  25 mg Oral QHS  . doxazosin  2 mg Oral Daily  . ferrous gluconate  324 mg Oral Daily  . fluticasone  1 spray Each Nare BID  . furosemide  40 mg Oral Daily  . heparin  5,000 Units Subcutaneous Q8H  . insulin aspart  0-5 Units Subcutaneous QHS  . insulin aspart  0-9 Units Subcutaneous TID WC  . isosorbide dinitrate  10 mg Oral TID  . levalbuterol  1.25 mg Nebulization BID  . loratadine  10 mg Oral Daily  . multivitamins with iron  1 tablet Oral Daily  . niacin  500 mg Oral Daily  . omega-3 acid ethyl esters  1 g Oral Daily  . pantoprazole  40 mg Oral Daily  . sodium chloride flush  3 mL Intravenous Q12H  . sodium chloride flush  3 mL Intravenous Q12H  . temazepam  30 mg Oral QHS   Continuous Infusions:    LOS: 4 days   Time Spent in minutes   30 minutes  Tynlee Bayle D.O. on 02/16/2016 at 10:03 AM  Between 7am to 7pm - Pager - [phone]  After 7pm go to [email or web address] - password [email or web address]  And look for the night coverage person covering for me after hours  [facility] Office  [phone]

## 2016-02-16 NOTE — Progress Notes (Signed)
Went to check on patient, patient was pulling mask off face. Pt was stating he wanted to take the mask off because he was coughing up a lot of mucus. Pt also stated that his breathing was better. RT place patient on a 2 LPM nasal cannula and explained to patient if he felt SOB we would place him back on BIPAP. BBS were diminished. No distress noted. Pt comfortable in chair with call bell in reach. SPO2 96% on 2 LPM. RN aware.

## 2016-02-16 NOTE — Progress Notes (Signed)
O2 sats 88% on RA.  Pt c/o mild SOB and faint wheezing noted.  Placed on 2L/[place], sats increased to 95%.  RT notified and neb requested.  RT unable to come evaluate pt at this time.  Xopenex neb given.  VSS.  Pt resting comfortably after neb.  Will continue to monitor.  Jodell Cipro

## 2016-02-16 NOTE — Progress Notes (Addendum)
DAILY PROGRESS NOTE  Subjective:  Cath personally reviewed overnight- this demonstrated 50% calcified LM disease, 95% ostial LCX disease and occluded RCA with left to right collaterals. It was felt that percutaneous intervention, especially given his cardiomyopathy would be extremely high risk and require hemodynamic support with an estimated mortality of at least 30%. CT surgery was consulted and I appreciate Dr. Guy Sandifer evaluation - it was felt after a thorough evaluation that he is at prohibitively high risk for both CABG and PCI. Medical therapy was recommended. Overnight he was noted to be hypoxic and tachypneic - he was placed on BIPAP and given lasix by the covering cardiology fellow.   Objective:  Temp:  [98.3 F (36.8 C)-98.4 F (36.9 C)] 98.4 F (36.9 C) (11/18 0530) Pulse Rate:  [75-119] 100 (11/18 0530) Resp:  [8-35] 21 (11/18 0530) BP: (105-146)/(38-83) 114/56 (11/18 0530) SpO2:  [90 %-100 %] 100 % (11/18 0832) Weight:  [151 lb 6.4 oz (68.7 kg)] 151 lb 6.4 oz (68.7 kg) (11/18 0530) Weight change: 6 lb 8 oz (2.948 kg)  Intake/Output from previous day: 11/17 0701 - 11/18 0700 In: 1160 [P.O.:360; I.V.:800] Out: 1100 [Urine:1100]  Intake/Output from this shift: Total I/O In: -  Out: 250 [Urine:250]  Medications: No current facility-administered medications on file prior to encounter.    Current Outpatient Prescriptions on File Prior to Encounter  Medication Sig Dispense Refill  . amoxicillin (AMOXIL) 500 MG capsule Take 2,000 mg by mouth as needed (1 hour before dental appointments).   1  . aspirin EC 81 MG tablet Take 81 mg by mouth every morning.     Marland Kitchen atorvastatin (LIPITOR) 40 MG tablet Take 20 mg by mouth every evening.     Marland Kitchen CARTIA XT 240 MG 24 hr capsule Take 240 mg by mouth every morning.    . carvedilol (COREG) 6.25 MG tablet Take 6.25 mg by mouth 2 (two) times daily with a meal.    . Cholecalciferol (VITAMIN D) 2000 UNITS tablet Take 2,000 Units by mouth  daily.    . diphenhydrAMINE (BENADRYL) 25 MG tablet Take 25 mg by mouth at bedtime.    Marland Kitchen doxazosin (CARDURA) 2 MG tablet Take 2 mg by mouth daily.     . Ferrous Gluconate (IRON) 246 (28 FE) MG TABS Take 2 tablets by mouth daily.    . fluticasone (FLONASE) 50 MCG/ACT nasal spray Place 1 spray into the nose 2 (two) times daily.     . furosemide (LASIX) 20 MG tablet Take 20 mg by mouth every other day. Take 1 tab every other day depending on weight gain    . glimepiride (AMARYL) 2 MG tablet Take 1 mg by mouth daily as needed (for blood sugar over 135 (1/8 of a 2 mg tablet)).     . isosorbide dinitrate (ISORDIL) 10 MG tablet Take 1 tablet (10 mg total) by mouth 3 (three) times daily. 90 tablet 2  . loratadine (CLARITIN) 10 MG tablet Take 10 mg by mouth daily.    . Multiple Vitamin (MULTIVITAMIN) capsule Take 1 capsule by mouth daily.    . niacin (SLO-NIACIN) 500 MG tablet Take 500 mg by mouth daily.     . Omega-3 Fatty Acids (FISH OIL) 1200 MG CAPS Take 4,800 mg by mouth every morning.     Marland Kitchen omeprazole (PRILOSEC) 20 MG capsule Take 20 mg by mouth daily.    . polyethylene glycol (MIRALAX / GLYCOLAX) packet Take 17 g by mouth every evening.     Marland Kitchen  PREVIDENT 5000 BOOSTER PLUS 1.1 % PSTE Take 1 application by mouth every evening.  Use once in place of toothpaste brush 2 minutes then spit. DO NOT RINSE  0  . temazepam (RESTORIL) 30 MG capsule Take 30 mg by mouth at bedtime. For sleep    . HYDROcodone-acetaminophen (NORCO/VICODIN) 5-325 MG tablet Take 1 tablet by mouth every 6 (six) hours. (Patient not taking: Reported on 02/12/2016) 30 tablet 0    Physical Exam: General appearance: alert and no distress Lungs: clear to auscultation bilaterally Heart: regular rate and rhythm Extremities: extremities normal, atraumatic, no cyanosis or edema Neurologic: Grossly normal  Lab Results: Results for orders placed or performed during the hospital encounter of 02/12/16 (from the past 48 hour(s))  Glucose,  capillary     Status: Abnormal   Collection Time: 02/14/16 11:50 AM  Result Value Ref Range   Glucose-Capillary 112 (H) 65 - 99 mg/dL  Glucose, capillary     Status: Abnormal   Collection Time: 02/14/16  5:16 PM  Result Value Ref Range   Glucose-Capillary 196 (H) 65 - 99 mg/dL  Heparin level (unfractionated)     Status: None   Collection Time: 02/14/16  8:04 PM  Result Value Ref Range   Heparin Unfractionated 0.31 0.30 - 0.70 IU/mL    Comment:        IF HEPARIN RESULTS ARE BELOW EXPECTED VALUES, AND PATIENT DOSAGE HAS BEEN CONFIRMED, SUGGEST FOLLOW UP TESTING OF ANTITHROMBIN III LEVELS.   Glucose, capillary     Status: Abnormal   Collection Time: 02/14/16  9:33 PM  Result Value Ref Range   Glucose-Capillary 201 (H) 65 - 99 mg/dL  Basic metabolic panel     Status: Abnormal   Collection Time: 02/15/16  4:32 AM  Result Value Ref Range   Sodium 139 135 - 145 mmol/L   Potassium 3.6 3.5 - 5.1 mmol/L   Chloride 103 101 - 111 mmol/L   CO2 24 22 - 32 mmol/L   Glucose, Bld 145 (H) 65 - 99 mg/dL   BUN 48 (H) 6 - 20 mg/dL   Creatinine, Ser 1.73 (H) 0.61 - 1.24 mg/dL   Calcium 9.2 8.9 - 10.3 mg/dL   GFR calc non Af Amer 35 (L) >60 mL/min   GFR calc Af Amer 40 (L) >60 mL/min    Comment: (NOTE) The eGFR has been calculated using the CKD EPI equation. This calculation has not been validated in all clinical situations. eGFR's persistently <60 mL/min signify possible Chronic Kidney Disease.    Anion gap 12 5 - 15  Heparin level (unfractionated)     Status: None   Collection Time: 02/15/16  4:32 AM  Result Value Ref Range   Heparin Unfractionated 0.41 0.30 - 0.70 IU/mL    Comment:        IF HEPARIN RESULTS ARE BELOW EXPECTED VALUES, AND PATIENT DOSAGE HAS BEEN CONFIRMED, SUGGEST FOLLOW UP TESTING OF ANTITHROMBIN III LEVELS.   CBC     Status: Abnormal   Collection Time: 02/15/16  4:32 AM  Result Value Ref Range   WBC 6.5 4.0 - 10.5 K/uL   RBC 2.86 (L) 4.22 - 5.81 MIL/uL    Hemoglobin 9.3 (L) 13.0 - 17.0 g/dL   HCT 27.7 (L) 39.0 - 52.0 %   MCV 96.9 78.0 - 100.0 fL   MCH 32.5 26.0 - 34.0 pg   MCHC 33.6 30.0 - 36.0 g/dL   RDW 13.5 11.5 - 15.5 %   Platelets 139 (L) 150 -  400 K/uL  Glucose, capillary     Status: Abnormal   Collection Time: 02/15/16  7:32 AM  Result Value Ref Range   Glucose-Capillary 166 (H) 65 - 99 mg/dL   Comment 1 Document in Chart   Glucose, capillary     Status: Abnormal   Collection Time: 02/15/16 11:37 AM  Result Value Ref Range   Glucose-Capillary 165 (H) 65 - 99 mg/dL   Comment 1 Document in Chart   I-STAT 3, arterial blood gas (G3+)     Status: Abnormal   Collection Time: 02/15/16 12:59 PM  Result Value Ref Range   pH, Arterial 7.398 7.350 - 7.450   pCO2 arterial 32.5 32.0 - 48.0 mmHg   pO2, Arterial 74.0 (L) 83.0 - 108.0 mmHg   Bicarbonate 20.0 20.0 - 28.0 mmol/L   TCO2 21 0 - 100 mmol/L   O2 Saturation 95.0 %   Acid-base deficit 4.0 (H) 0.0 - 2.0 mmol/L   Patient temperature HIDE    Sample type ARTERIAL   POCT Activated clotting time     Status: None   Collection Time: 02/15/16  1:25 PM  Result Value Ref Range   Activated Clotting Time 114 seconds  I-STAT 3, venous blood gas (G3P V)     Status: Abnormal   Collection Time: 02/15/16  1:30 PM  Result Value Ref Range   pH, Ven 7.361 7.250 - 7.430   pCO2, Ven 38.1 (L) 44.0 - 60.0 mmHg   pO2, Ven 24.0 (LL) 32.0 - 45.0 mmHg   Bicarbonate 21.5 20.0 - 28.0 mmol/L   TCO2 23 0 - 100 mmol/L   O2 Saturation 41.0 %   Acid-base deficit 4.0 (H) 0.0 - 2.0 mmol/L   Patient temperature HIDE    Sample type VENOUS    Comment NOTIFIED PHYSICIAN   I-STAT 3, venous blood gas (G3P V)     Status: Abnormal   Collection Time: 02/15/16  1:36 PM  Result Value Ref Range   pH, Ven 7.371 7.250 - 7.430   pCO2, Ven 37.6 (L) 44.0 - 60.0 mmHg   pO2, Ven 27.0 (LL) 32.0 - 45.0 mmHg   Bicarbonate 21.8 20.0 - 28.0 mmol/L   TCO2 23 0 - 100 mmol/L   O2 Saturation 49.0 %   Acid-base deficit 3.0 (H)  0.0 - 2.0 mmol/L   Patient temperature HIDE    Sample type VENOUS    Comment NOTIFIED PHYSICIAN   Glucose, capillary     Status: Abnormal   Collection Time: 02/15/16  2:12 PM  Result Value Ref Range   Glucose-Capillary 159 (H) 65 - 99 mg/dL  CBC     Status: Abnormal   Collection Time: 02/15/16  3:21 PM  Result Value Ref Range   WBC 5.9 4.0 - 10.5 K/uL   RBC 2.85 (L) 4.22 - 5.81 MIL/uL   Hemoglobin 9.1 (L) 13.0 - 17.0 g/dL   HCT 28.1 (L) 39.0 - 52.0 %   MCV 98.6 78.0 - 100.0 fL   MCH 31.9 26.0 - 34.0 pg   MCHC 32.4 30.0 - 36.0 g/dL   RDW 13.6 11.5 - 15.5 %   Platelets 127 (L) 150 - 400 K/uL  Creatinine, serum     Status: Abnormal   Collection Time: 02/15/16  3:21 PM  Result Value Ref Range   Creatinine, Ser 1.66 (H) 0.61 - 1.24 mg/dL   GFR calc non Af Amer 37 (L) >60 mL/min   GFR calc Af Amer 42 (L) >60 mL/min  Comment: (NOTE) The eGFR has been calculated using the CKD EPI equation. This calculation has not been validated in all clinical situations. eGFR's persistently <60 mL/min signify possible Chronic Kidney Disease.   Glucose, capillary     Status: Abnormal   Collection Time: 02/15/16  4:57 PM  Result Value Ref Range   Glucose-Capillary 163 (H) 65 - 99 mg/dL   Comment 1 Document in Chart   Glucose, capillary     Status: Abnormal   Collection Time: 02/15/16  9:24 PM  Result Value Ref Range   Glucose-Capillary 156 (H) 65 - 99 mg/dL  Basic metabolic panel     Status: Abnormal   Collection Time: 02/16/16  4:11 AM  Result Value Ref Range   Sodium 139 135 - 145 mmol/L   Potassium 4.0 3.5 - 5.1 mmol/L   Chloride 105 101 - 111 mmol/L   CO2 23 22 - 32 mmol/L   Glucose, Bld 165 (H) 65 - 99 mg/dL   BUN 49 (H) 6 - 20 mg/dL   Creatinine, Ser 1.80 (H) 0.61 - 1.24 mg/dL   Calcium 9.2 8.9 - 10.3 mg/dL   GFR calc non Af Amer 33 (L) >60 mL/min   GFR calc Af Amer 38 (L) >60 mL/min    Comment: (NOTE) The eGFR has been calculated using the CKD EPI equation. This calculation  has not been validated in all clinical situations. eGFR's persistently <60 mL/min signify possible Chronic Kidney Disease.    Anion gap 11 5 - 15  CBC     Status: Abnormal   Collection Time: 02/16/16  4:11 AM  Result Value Ref Range   WBC 7.0 4.0 - 10.5 K/uL   RBC 2.92 (L) 4.22 - 5.81 MIL/uL   Hemoglobin 9.4 (L) 13.0 - 17.0 g/dL   HCT 28.8 (L) 39.0 - 52.0 %   MCV 98.6 78.0 - 100.0 fL   MCH 32.2 26.0 - 34.0 pg   MCHC 32.6 30.0 - 36.0 g/dL   RDW 13.9 11.5 - 15.5 %   Platelets 133 (L) 150 - 400 K/uL  Glucose, capillary     Status: Abnormal   Collection Time: 02/16/16  7:43 AM  Result Value Ref Range   Glucose-Capillary 160 (H) 65 - 99 mg/dL   Comment 1 Document in Chart     Imaging: Dg Chest Port 1 View  Result Date: 02/16/2016 CLINICAL DATA:  Hypoxia EXAM: PORTABLE CHEST 1 VIEW COMPARISON:  02/12/2016 FINDINGS: Prior median sternotomy. Stent graft noted within the descending thoracic aorta. There is cardiomegaly with bilateral airspace opacities and small layering effusions. Findings compatible with mild CHF. IMPRESSION: Continued mild CHF pattern with small bilateral layering effusions. Electronically Signed   By: Rolm Baptise M.D.   On: 02/16/2016 07:46    Assessment:  Principal Problem:   Acute on chronic combined systolic and diastolic CHF (congestive heart failure) (HCC) Active Problems:   Essential hypertension   Dyslipidemia associated with type 2 diabetes mellitus (HCC)   Diabetes mellitus, type 2 ([place])   NSTEMI, initial episode of care [facility])   Acute respiratory failure with hypoxia (HCC)   Elevated troponin   Acute renal failure superimposed on stage 3 chronic kidney disease ([place])   Plan:  1. Long discussion about limited options for revascularization - CABG is risk prohibitive and PCI would be extremely high risk with a 30-50% procedural mortality, even if it could be technically accomplished. At this point, I am favoring medical therapy and it would be  reasonable to  involve palliative care. Given his advanced coronary disease and heart failure, he feasibly has a prognosis of <6 months and would be appropriate for hospice in my opinion as well. I discussed the low likelihood of successful resuscitation and he wishes to be DNR - I completed a yellow MOLST form as well and documented his advanced directive. Appreciate palliative care recommendations. Would work for discharge home by the end of the weekend if possible.  Time Spent Directly with Patient:  35 minutes  Length of Stay:  LOS: 4 days   Pixie Casino, MD, [facility] Attending Cardiologist [place] 02/16/2016, 9:16 AM

## 2016-02-17 DIAGNOSIS — I255 Ischemic cardiomyopathy: Secondary | ICD-10-CM

## 2016-02-17 DIAGNOSIS — R0902 Hypoxemia: Secondary | ICD-10-CM

## 2016-02-17 DIAGNOSIS — Z515 Encounter for palliative care: Secondary | ICD-10-CM

## 2016-02-17 LAB — BASIC METABOLIC PANEL
Anion gap: 13 (ref 5–15)
BUN: 67 mg/dL — AB (ref 6–20)
CALCIUM: 9 mg/dL (ref 8.9–10.3)
CO2: 20 mmol/L — ABNORMAL LOW (ref 22–32)
CREATININE: 2.35 mg/dL — AB (ref 0.61–1.24)
Chloride: 103 mmol/L (ref 101–111)
GFR calc Af Amer: 28 mL/min — ABNORMAL LOW (ref >60)
GFR, EST NON AFRICAN AMERICAN: 24 mL/min — AB (ref >60)
GLUCOSE: 222 mg/dL — AB (ref 65–99)
Potassium: 4 mmol/L (ref 3.5–5.1)
SODIUM: 136 mmol/L (ref 135–145)

## 2016-02-17 LAB — TROPONIN I
TROPONIN I: 0.36 ng/mL — AB (ref <0.03)
TROPONIN I: 0.25 ng/mL — AB (ref <0.03)
Troponin I: 0.28 ng/mL (ref <0.03)

## 2016-02-17 LAB — GLUCOSE, CAPILLARY
GLUCOSE-CAPILLARY: 256 mg/dL — AB (ref 65–99)
GLUCOSE-CAPILLARY: 268 mg/dL — AB (ref 65–99)
Glucose-Capillary: 267 mg/dL — ABNORMAL HIGH (ref 65–99)

## 2016-02-17 LAB — CBC
HCT: 30 % — ABNORMAL LOW (ref 39.0–52.0)
Hemoglobin: 9.7 g/dL — ABNORMAL LOW (ref 13.0–17.0)
MCH: 31.9 pg (ref 26.0–34.0)
MCHC: 32.3 g/dL (ref 30.0–36.0)
MCV: 98.7 fL (ref 78.0–100.0)
PLATELETS: 141 10*3/uL — AB (ref 150–400)
RBC: 3.04 MIL/uL — ABNORMAL LOW (ref 4.22–5.81)
RDW: 13.7 % (ref 11.5–15.5)
WBC: 8.6 10*3/uL (ref 4.0–10.5)

## 2016-02-17 LAB — BRAIN NATRIURETIC PEPTIDE: B Natriuretic Peptide: 1043.2 pg/mL — ABNORMAL HIGH (ref 0.0–100.0)

## 2016-02-17 MED ORDER — MORPHINE SULFATE (PF) 2 MG/ML IV SOLN: 1.0000 mg | INTRAVENOUS | Status: DC | PRN

## 2016-02-17 MED ORDER — IPRATROPIUM BROMIDE 0.02 % IN SOLN
0.5000 mg | Freq: Four times a day (QID) | RESPIRATORY_TRACT | Status: DC
  Administered 2016-02-17 (×2): 0.5 mg via RESPIRATORY_TRACT
  Filled 2016-02-17 (×2): qty 2.5

## 2016-02-17 MED ORDER — MORPHINE SULFATE (CONCENTRATE) 10 MG/0.5ML PO SOLN: 5.0000 mg | ORAL | Status: DC | PRN

## 2016-02-17 MED ORDER — METHYLPREDNISOLONE SODIUM SUCC 125 MG IJ SOLR
60.0000 mg | Freq: Once | INTRAMUSCULAR | Status: AC
  Administered 2016-02-17: 60 mg via INTRAVENOUS
  Filled 2016-02-17: qty 2

## 2016-02-17 MED ORDER — FUROSEMIDE 10 MG/ML IJ SOLN
40.0000 mg | Freq: Once | INTRAMUSCULAR | Status: AC
  Administered 2016-02-17: 40 mg via INTRAVENOUS
  Filled 2016-02-17: qty 4

## 2016-02-17 MED ORDER — IPRATROPIUM BROMIDE 0.02 % IN SOLN
0.5000 mg | RESPIRATORY_TRACT | Status: DC
  Administered 2016-02-17: 0.5 mg via RESPIRATORY_TRACT
  Filled 2016-02-17: qty 2.5

## 2016-02-17 MED ORDER — HYDRALAZINE HCL 20 MG/ML IJ SOLN
5.0000 mg | INTRAMUSCULAR | Status: DC | PRN
Start: 2016-02-17 — End: 2016-02-18

## 2016-02-17 MED ORDER — LEVALBUTEROL HCL 1.25 MG/0.5ML IN NEBU
1.2500 mg | INHALATION_SOLUTION | Freq: Four times a day (QID) | RESPIRATORY_TRACT | Status: DC
  Administered 2016-02-17 (×3): 1.25 mg via RESPIRATORY_TRACT
  Filled 2016-02-17 (×3): qty 0.5

## 2016-02-17 MED ORDER — LORAZEPAM 2 MG/ML IJ SOLN
0.5000 mg | Freq: Four times a day (QID) | INTRAMUSCULAR | Status: DC | PRN
  Administered 2016-02-18: 0.5 mg via INTRAVENOUS
  Filled 2016-02-17: qty 1

## 2016-02-17 NOTE — Consult Note (Signed)
Consultation Note Date: 02/17/2016   Patient Name: [name]  DOB: 18-Jan-1931  MRN: [id number]  Age / Sex: 82 y.o., male  PCP: [name], MD Referring Physician: Cristal Ford, DO  Reason for Consultation: Establishing goals of care, Hospice Evaluation, Non pain symptom management, Pain control and Psychosocial/spiritual support  HPI/Patient Profile: 82 y.o. 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 BNP on admission was 1043; troponin 0.36. Chest x-ray reflects congestive heart failure pattern with bilateral effusions. Patient underwent a right heart cath which were shows severe disease, global hypokinesis, inferior lateral akinesis 50% blockage to left main, 95% to left circumflex, occluded RCA. Ejection fraction now decreased to 35-40% from 40-45%. Patient had an episode of acute shortness of breath on 02/16/2016. Patient is now on high flow O2 alternating with BiPAP. He is short of breath at rest. He was unable to participate with PT today which is a change from just 2 days ago because of significant dyspnea.  Clinical Assessment and Goals of Care: Patient is out of bed to the chair today and is short of breath at rest. I met with patient and his son Randall Hiss to discuss goals of care. A MOST form was completed and is on the chart. Generally it reflects DO NOT RESUSCITATE, no PEG tube, nor would he want hemodialysis. We did discuss disease trajectory related to severe heart disease as well as my concern given patient's ongoing level of shortness of breath now that he is no longer fluid overloaded.  We talked about initial steps being how to titrate off of high flow O2 as well as BiPAP before moving to next level of care. Patient is too debilitated to return home with his son. He is only eating bites and sips because of dyspnea and is now unable to even put it in transfer on his own to a bedside commode.  Alternatives discussed were skilled nursing facility for brief rehabilitation with the goal to return home if he is able to stabilize and come off of higher  Oxygen delivery systems, or inpatient hospice. I did share with son that I was concerned given patient's respiratory status that if things were to continue to go the way they are now he may qualify for inpatient hospice. Son is tearful but recognizes his father's declining status just since hospital admission on 02/12/2016  NEXT OF KIN patient's son Randall Hiss and daughter Juliene Pina. Their contact information is on the MOST form    SUMMARY OF RECOMMENDATIONS   DNR/DNI Continue to treat the treatable while in the hospital Challenge will be to titrate off of BiPAP and high flow O2 before any disposition can be arranged We'll add morphine concentrate as well as injectable morphine for acute dyspnea. This may enable patient to come  off of BiPAP, high flow O2 We'll also add Ativan IV, as well as continuing Xanax as needed for associated anxiety with severe dyspnea Code Status/Advance Care Planning:  DNR    Symptom Management:   Dyspnea: Goal to titrate off of BiPAP and high flow O2 to provide alternatives to hospitalization, as well as a clearer picture as to patient's clinical status in terms of prognosis of less than 6 months versus weeks. Patient is not opioid nave. He has been on hydrocodone since 2002 for back pain. Reviewed with patient and his son the role of opioids in not only managing pain but dyspnea and they were agreeable to try morphine concentrate. We'll order morphine concentrate as well as injectable option for severe dyspnea;  recommend that this be first choice for severe dyspnea as opposed to Lasix and BiPAP  Pain: Patient has long-standing back pain after he sustained a MRSA infection in his spine in 2002. He has been on hydrocodone for a number of years averaging 3 tabs a day since May of this year. Prior to that he was taking 6 tabs a day. Utilize morphine concentrate as well as Tylenol as needed for back pain  Anxiety: Patient has been on Xanax, continue this as needed. We'll also add Ativan for an injectable option for severe dyspnea with associated anxiety  Constipation: Continue senna  Palliative Prophylaxis:   Aspiration, Bowel Regimen, Delirium Protocol, Frequent Pain Assessment, Oral Care and Turn Reposition  Additional Recommendations (Limitations, Scope, Preferences):  No Artificial Feeding, No Chemotherapy, No Hemodialysis, No Radiation and No Surgical Procedures  Psycho-social/Spiritual:   Desire for further Chaplaincy support:no  Additional Recommendations: Grief/Bereavement Support  Prognosis:   Unable to determine. Patient clearly qualifies for hospice benefit in the home, facility in that he has a prognosis of less than 6 months in the setting of severe heart disease,  combined heart failure and now with clear evidence of severe coronary artery disease with hypokinesis and decreasing ejection fraction, now 35%. I am concerned however at this point since hospital since admission, his respiratory status is worsening, requiring higher oxygen delivery, that he may in fact be days to weeks if he is unable to titrate off of higher delivery oxygen area I did discuss this in detail with his son  Discharge Planning: If patient is able to stabilize family is looking at SNF with brief rehabilitation versus if he is unable to see if he qualifies for inpatient hospice. It does not appear in either event that he will be able to return home given his level of debility     Primary Diagnoses: Present on  Admission: . Acute respiratory failure with hypoxia ([place]) . Dyslipidemia associated with type 2 diabetes mellitus ([place]) . Essential hypertension . Acute on chronic combined systolic and diastolic CHF (congestive heart failure) ([place]) . Elevated troponin . Acute renal failure superimposed on stage 3 chronic kidney disease ([place]) . NSTEMI, initial episode of care [facility])   I have reviewed the medical record, interviewed the patient and family, and examined the patient. The following aspects are pertinent.  Past Medical History:  Diagnosis Date  . Anemia   . CHF (congestive heart failure) ([place]) 05/2013  . Degenerative disc disease, lumbar   . Diabetes mellitus, type 2 ([place])   . Dyspnea   . Flu Feb. 9, 2015   ?  Mild Heart Attack  . GERD (gastroesophageal reflux disease)   . H/O thoracic aortic aneurysm repair    Stent Graft (penetrating ulcer) -  r/t staphylococcal infection  . High cholesterol   . History of tobacco use    50 pack year history, quit in 2002  . Hypertension   . Left carotid artery occlusion    moderate R carotid disease; L Common Carotid Bypassed during Arch Repair but graft occluded  . MRSA infection    s/p '02-no problems now  . Myocardial infarction Feb. 9, 2015   Mild Heart attack  . Osteoporosis, senile   . PAD (peripheral artery disease) ([facility])    s/p L Fem-Pop Bypass; R Iliac PTA  . Seasonal allergies   . Stroke ([place])   . Transfusion history    '02- back surgery   Social History   Social History  . Marital status: Widowed    Spouse name: N/A  . Number of children: 2  . Years of education: N/A   Occupational History  . retired - Engineer, manufacturing systems    Social History Main Topics  . Smoking status: Former Smoker    Quit date: 09/24/2000  . Smokeless tobacco: Never Used  . Alcohol use No  . Drug use: No  . Sexual activity: Not Currently   Other Topics Concern  . None   Social History Narrative  . None   Family History  Problem Relation Age of  Onset  . Diabetes Mother   . Hypertension Mother     heart disese, died of MI at 81  . Hyperlipidemia Mother   . Heart disease Father     CHF  . Heart attack Father   . Arthritis Father   . Diabetes Sister     also Alzheimers  . Heart disease Son     Heart Disease before age 35  . Marfan syndrome Daughter   . AAA (abdominal aortic aneurysm) Son     repair in 20s  . Marfan syndrome Son    Scheduled Meds: . aspirin EC  81 mg Oral q morning - 10a  . atorvastatin  20 mg Oral QPM  . carvedilol  12.5 mg Oral BID WC  . cholecalciferol  2,000 Units Oral Daily  . diltiazem  180 mg Oral [id number]  . diphenhydrAMINE  25 mg Oral QHS  . doxazosin  2 mg Oral Daily  . ferrous gluconate  324 mg Oral Daily  . fluticasone  1 spray Each Nare BID  . furosemide  40 mg Oral Daily  . heparin  5,000 Units Subcutaneous Q8H  . insulin aspart  0-5 Units Subcutaneous QHS  . insulin aspart  0-9 Units Subcutaneous TID WC  . ipratropium  0.5 mg Nebulization Q6H  . isosorbide dinitrate  10 mg Oral TID  . levalbuterol  1.25 mg Nebulization Q6H  . loratadine  10 mg Oral Daily  . multivitamins with iron  1 tablet Oral Daily  . niacin  500 mg Oral Daily  . omega-3 acid ethyl esters  1 g Oral Daily  . pantoprazole  40 mg Oral Daily  . sodium chloride flush  3 mL Intravenous Q12H  . sodium chloride flush  3 mL Intravenous Q12H  . temazepam  30 mg Oral QHS   Continuous Infusions: PRN Meds:.sodium chloride, sodium chloride, acetaminophen, ALPRAZolam, hydrALAZINE, levalbuterol, LORazepam, morphine injection, morphine CONCENTRATE, ondansetron (ZOFRAN) IV, polyethylene glycol, sodium chloride flush, sodium chloride flush Medications Prior to Admission:  Prior to Admission medications   Medication Sig Start Date End Date Taking? Authorizing Provider  amoxicillin (AMOXIL) 500 MG capsule Take 2,000 mg by mouth as needed (1 hour  before dental appointments).  03/02/15  Yes Historical Provider, MD  aspirin EC 81 MG  tablet Take 81 mg by mouth every morning.    Yes Historical Provider, MD  atorvastatin (LIPITOR) 40 MG tablet Take 20 mg by mouth every evening.    Yes Historical Provider, MD  CARTIA XT 240 MG 24 hr capsule Take 240 mg by mouth every morning. 09/02/15  Yes Historical Provider, MD  carvedilol (COREG) 6.25 MG tablet Take 6.25 mg by mouth 2 (two) times daily with a meal.   Yes Historical Provider, MD  Cholecalciferol (VITAMIN D) 2000 UNITS tablet Take 2,000 Units by mouth daily.   Yes Historical Provider, MD  diphenhydrAMINE (BENADRYL) 25 MG tablet Take 25 mg by mouth at bedtime.   Yes Historical Provider, MD  doxazosin (CARDURA) 2 MG tablet Take 2 mg by mouth daily.  06/04/13  Yes Historical Provider, MD  Ferrous Gluconate (IRON) 246 (28 FE) MG TABS Take 2 tablets by mouth daily.   Yes Historical Provider, MD  fluticasone (FLONASE) 50 MCG/ACT nasal spray Place 1 spray into the nose 2 (two) times daily.    Yes Historical Provider, MD  furosemide (LASIX) 20 MG tablet Take 20 mg by mouth every other day. Take 1 tab every other day depending on weight gain 02/22/15  Yes Historical Provider, MD  glimepiride (AMARYL) 2 MG tablet Take 1 mg by mouth daily as needed (for blood sugar over 135 (1/8 of a 2 mg tablet)).    Yes Historical Provider, MD  isosorbide dinitrate (ISORDIL) 10 MG tablet Take 1 tablet (10 mg total) by mouth 3 (three) times daily. 05/16/13  Yes Erline Hau, MD  loratadine (CLARITIN) 10 MG tablet Take 10 mg by mouth daily.   Yes Historical Provider, MD  Multiple Vitamin (MULTIVITAMIN) capsule Take 1 capsule by mouth daily.   Yes Historical Provider, MD  niacin (SLO-NIACIN) 500 MG tablet Take 500 mg by mouth daily.    Yes Historical Provider, MD  Omega-3 Fatty Acids (FISH OIL) 1200 MG CAPS Take 4,800 mg by mouth every morning.    Yes Historical Provider, MD  omeprazole (PRILOSEC) 20 MG capsule Take 20 mg by mouth daily.   Yes Historical Provider, MD  polyethylene glycol (MIRALAX /  GLYCOLAX) packet Take 17 g by mouth every evening.    Yes Historical Provider, MD  PREVIDENT 5000 BOOSTER PLUS 1.1 % PSTE Take 1 application by mouth every evening.  Use once in place of toothpaste brush 2 minutes then spit. DO NOT RINSE 04/03/15  Yes Historical Provider, MD  temazepam (RESTORIL) 30 MG capsule Take 30 mg by mouth at bedtime. For sleep   Yes Historical Provider, MD  HYDROcodone-acetaminophen (NORCO/VICODIN) 5-325 MG tablet Take 1 tablet by mouth every 6 (six) hours. Patient not taking: Reported on 02/12/2016 01/25/16   Dorie Rank, MD   Allergies  Allergen Reactions  . Codeine Nausea And Vomiting   Review of Systems  Unable to perform ROS: Severe respiratory distress    Physical Exam  Constitutional:  frail elderly man; SHOB at rest  HENT:  Head: Normocephalic and atraumatic.  Neck: Normal range of motion.  Pulmonary/Chest:  Increased work of breathing at rest  Musculoskeletal:  Right upper extremity in a sling  Neurological:  Patient looks as though he is struggling to stay awake He does appear to be answering questions relevantly  Skin: Skin is warm and dry.  Psychiatric:  Patient appears to be struggling to stay awake, concentration is poor  Nursing note and vitals reviewed.   Vital Signs: BP (!) 122/101   Pulse 99   Temp 97.7 F (36.5 C) (Oral)   Resp (!) 29   Ht '5\' 5"'  (1.651 m)   Wt 66.5 kg (146 lb 9.7 oz)   SpO2 98%   BMI 24.40 kg/m  Pain Assessment: 0-10 POSS *See Group Information*: S-Acceptable,Sleep, easy to arouse Pain Score: 0-No pain   SpO2: SpO2: 98 % O2 Device:SpO2: 98 % O2 Flow Rate: .O2 Flow Rate (L/min): 8 L/min  IO: Intake/output summary:  Intake/Output Summary (Last 24 hours) at 02/17/16 1653 Last data filed at 02/17/16 0800  Gross per 24 hour  Intake              480 ml  Output              700 ml  Net             -220 ml    LBM: Last BM Date: 02/16/16 Baseline Weight: Weight: 69 kg (152 lb 1.9 oz) Most recent weight:  Weight: 66.5 kg (146 lb 9.7 oz)     Palliative Assessment/Data:   Flowsheet Rows   Flowsheet Row Most Recent Value  Intake Tab  Referral Department  Hospitalist  Unit at Time of Referral  Cardiac/Telemetry Unit  Palliative Care Primary Diagnosis  Cardiac  Date Notified  02/16/16  Palliative Care Type  New Palliative care  Reason for referral  Clarify Goals of Care, Non-pain Symptom, Pain, End of Life Care Assistance, Advance Care Planning, Psychosocial or Spiritual support  Date of Admission  02/12/16  Date first seen by Palliative Care  02/17/16  # of days Palliative referral response time  1 Day(s)  # of days IP prior to Palliative referral  4  Clinical Assessment  Palliative Performance Scale Score  30%  Pain Max last 24 hours  Not able to report  Pain Min Last 24 hours  Not able to report  Dyspnea Max Last 24 Hours  Not able to report  Dyspnea Min Last 24 hours  Not able to report  Nausea Max Last 24 Hours  Not able to report  Nausea Min Last 24 Hours  Not able to report  Anxiety Max Last 24 Hours  Not able to report  Anxiety Min Last 24 Hours  Not able to report  Other Max Last 24 Hours  Not able to report  Psychosocial & Spiritual Assessment  Palliative Care Outcomes  Patient/Family meeting held?  Yes  Who was at the meeting?  pt and son  Palliative Care Outcomes  Improved pain interventions, Improved non-pain symptom therapy, Clarified goals of care, Provided advance care planning, Counseled regarding hospice, Provided psychosocial or spiritual support  Patient/Family wishes: Interventions discontinued/not started   Mechanical Ventilation, Hemodialysis, Vasopressors, Trach, PEG  Palliative Care follow-up planned  Yes, Facility      Time In: 1415 Time Out: 1545 Time Total: 90 min Greater than 50%  of this time was spent counseling and coordinating care related to the above assessment and plan. Staffed with Dr. Ree Kida  Signed by: Dory Horn, NP   Please  contact Palliative Medicine Team phone at [phone] for questions and concerns.  For individual provider: See Shea Evans

## 2016-02-17 NOTE — Progress Notes (Signed)
Report received in patient's room via [facility] using [employer], reviewed orders, labs, VS, meds and patient's general condition, assumed care of patient.

## 2016-02-17 NOTE — Progress Notes (Signed)
PROGRESS NOTE    [name]  [id number] DOB: 09/01/1930 DOA: 02/12/2016 PCP: [name], MD   Chief Complaint  Patient presents with  . Shortness of Breath    Brief Narrative:  HPI on 02/12/2016 by Dr. Ivor Costa [name] is a 82 y.o. male with medical history significant of chronic combined systolic and diastolic CHF, hypertension, hyperlipidemia, diabetes mellitus, GERD, stroke, PAD, CAD, formal smoker, anemia, CKD-III, who presents with shortness of breath.  Patient states that she has been having shortness of breath for a week, which has been progressively getting worse. It is aggravated by exertion. Patient was on Lasix 20 mg every other days, which was increased to 40 mg daily by his PCP without significant help. Patient has mild dry cough, but no chest pain fever or chills. She does not have nausea, vomiting, abdominal pain, diarrhea, symptoms of UTI. No new unilateral weakness, vision change or hearing loss. He has bilateral leg edema. Of note, pt has right upper fracture wearing splint, has mild pain. Assessment & Plan   Acute respiratory failure with hypoxia likely secondary to CHF exacerbation -Patient noted to have oxygen saturations of 91% on room air however was placed on BiPAP in the emergency department, O2 sats improved to 93% -Currently stable, O2 sats between 92-93% off of O2, 100% on nasal canula -Was placed on BiPAP and given IV lasix overnight due to SOB -?SOB has some component of anxiety  Acute on chronic combined systolic and diastolic heart failure -Chest x-ray showed degree of congestive heart failure -BNP 887 -Echocardiogram 10/22/2015 showed an EF of [id number], grade 2 diastolic dysfunction -Possibly related to worsening coronary artery disease as patient does have elevated troponin -Cardiology consulted and appreciated -Plan for right and left heart cath once creatinine has improved -Patient currently not a candidate for ACEi/ARB/Entresto  due to AK I and history of hyperkalemia -Continue to monitor daily weights, intake, output -Urine output 1450cc over past 24hrs, weight down 8lbs since admission -Patient given some fluid yesterday for AKI and Cath, had some shortness of breath overnight and received lasix IV -Continue lasix 40mg  PO daily  Elevated troponin/NSTEMI -Troponin peaked at 1.05 -Continue IV heparin -Continue aspirin, statin -s/p R/LHC: severe disease 50% calcified LM disease, 95% ostial LCX disease and occluded RCA with left to right collaterals -Patient not a candidate for surgery.  -Spoke with patient and son regarding code status and possibly Hospice.  Palliative care consulted.   Acute on chronic kidney disease, stage III -Baseline creatinine 1.3-1.4 -Upon admission creatinine 1.91 -Creatinine currently 2.35 (given that patient has received IV lasix last night) -Continue to monitor BMP  Chronic normocytic anemia -Baseline hemoglobin approximate 10, currently 9.7 -Continue iron supplementation -Monitor CBC  Diabetes mellitus, type II -Continue insulin sliding scale, CBG monitoring  Hyperlipidemia -Continue statin  Hypertension -Continue Coreg, diltiazem, Isordil  Right humeral fracture status post fall -Currently in sling -Patient follows up with orthopedics -Continue pain control -PT recommended HH  GERD -Continue PPI  Goals of care -Palliative care consulted and appreciated -Spoke with son, not sure he can take care of his dad and provide for him 24 hour care at home.   -Have asked PT to reevaulate given patient's continued shortness of breath and medical needs  DVT Prophylaxis  heparin  Code Status:DNR  Family Communication: Son at  bedside  Disposition Plan: Admitted. Continue CHF treatment. Palliative care. Home with HHPT when stable  Consultants Cardiology Palliative care  Procedures  [facility]  Antibiotics  Anti-infectives    None      Subjective:   [name] seen and examined today.  Patient did complain of SOB overnight. States it occurs when he transfers from chair to bed. He begins to pant. Currently, he denies currently chest pain, abdominal pain, nausea or vomiting, diarrhea or constipation.  Objective:   Vitals:   02/17/16 0747 02/17/16 0758 02/17/16 0948 02/17/16 1106  BP: (!) 138/98 (!) 138/98  (!) 100/59  Pulse: (!) 114 (!) 105  93  Resp: (!) 23 20  20   Temp:  97.7 F (36.5 C)  97.7 F (36.5 C)  TempSrc:  Oral  Oral  SpO2: 100% 98% 98% 98%  Weight:      Height:        Intake/Output Summary (Last 24 hours) at 02/17/16 1122 Last data filed at 02/17/16 0800  Gross per 24 hour  Intake              720 ml  Output              900 ml  Net             -180 ml   Filed Weights   02/15/16 0306 02/16/16 0530 02/17/16 0450  Weight: 65.7 kg (144 lb 14.4 oz) 68.7 kg (151 lb 6.4 oz) 66.5 kg (146 lb 9.7 oz)    Exam  General: Well developed, well nourished, no apparent distress  HEENT: NCAT, mucous membranes moist.   Cardiovascular: S1 S2 auscultated, 2/6SEM, RRR  Respiratory: Diminished breath sounds, but clear  Abdomen: Soft, nontender, nondistended, + bowel sounds  Extremities: warm dry without cyanosis clubbing. +LE edema  Neuro: AAOx3, nonfocal (patient does have a speech impediment at baseline)  Psych: Appropriate mood and affect   Data Reviewed: I have personally reviewed following labs and imaging studies  CBC:  Recent Labs Lab 02/12/16 2011  02/14/16 0539 02/15/16 0432 02/15/16 1521 02/16/16 0411 02/17/16 0300  WBC 6.7  < > 6.4 6.5 5.9 7.0 8.6  NEUTROABS 5.1  --   --   --   --   --   --   HGB 10.1*  < > 9.3* 9.3* 9.1* 9.4* 9.7*  HCT 29.8*  < > 28.2* 27.7* 28.1* 28.8* 30.0*  MCV 97.7  < > 98.6 96.9 98.6 98.6 98.7  PLT 139*  < > 128* 139* 127* 133* 141*  < > = values in this interval not displayed. Basic Metabolic Panel:  Recent Labs Lab 02/13/16 0427 02/14/16 0539 02/15/16 0432 02/15/16 1521  02/16/16 0411 02/17/16 0300  NA 135 137 139  --  139 136  K 3.5 3.4* 3.6  --  4.0 4.0  CL 102 101 103  --  105 103  CO2 21* 26 24  --  23 20*  GLUCOSE 132* 187* 145*  --  165* 222*  BUN 47* 43* 48*  --  49* 67*  CREATININE 1.74* 1.60* 1.73* 1.66* 1.80* 2.35*  CALCIUM 8.8* 8.9 9.2  --  9.2 9.0   GFR: Estimated Creatinine Clearance: 20.7 mL/min (by C-G formula based on SCr of 2.35 mg/dL (H)). Liver Function Tests:  Recent Labs Lab 02/12/16 2011  AST 21  ALT 13*  ALKPHOS 200*  BILITOT 0.7  PROT 6.6  ALBUMIN 3.5   No results for input(s): LIPASE, AMYLASE in the last 168 hours. No results for input(s): AMMONIA in the last 168 hours. Coagulation Profile:  Recent Labs Lab 02/13/16 0005  INR 1.15  Cardiac Enzymes:  Recent Labs Lab 02/13/16 0005 02/13/16 0427 02/13/16 1103 02/17/16 0300 02/17/16 0807  TROPONINI 0.78* 1.05* 0.98* 0.36* 0.25*   BNP (last 3 results) No results for input(s): PROBNP in the last 8760 hours. HbA1C: No results for input(s): HGBA1C in the last 72 hours. CBG:  Recent Labs Lab 02/16/16 1220 02/16/16 1627 02/16/16 2150 02/17/16 0750 02/17/16 1104  GLUCAP 161* 247* 177* 267* 256*   Lipid Profile: No results for input(s): CHOL, HDL, LDLCALC, TRIG, CHOLHDL, LDLDIRECT in the last 72 hours. Thyroid Function Tests: No results for input(s): TSH, T4TOTAL, FREET4, T3FREE, THYROIDAB in the last 72 hours. Anemia Panel: No results for input(s): VITAMINB12, FOLATE, FERRITIN, TIBC, IRON, RETICCTPCT in the last 72 hours. Urine analysis:    Component Value Date/Time   COLORURINE YELLOW 11/15/2015 [place] 11/15/2015 1645   LABSPEC 1.020 11/15/2015 1645   PHURINE 5.0 11/15/2015 1645   GLUCOSEU NEGATIVE 11/15/2015 1645   HGBUR NEGATIVE 11/15/2015 1645   BILIRUBINUR NEGATIVE 11/15/2015 1645   KETONESUR NEGATIVE 11/15/2015 1645   PROTEINUR NEGATIVE 11/15/2015 1645   UROBILINOGEN 0.2 06/13/2013 1400   NITRITE NEGATIVE  11/15/2015 1645   LEUKOCYTESUR NEGATIVE 11/15/2015 1645   Sepsis Labs: @LABRCNTIP (procalcitonin:4,lacticidven:4)  ) Recent Results (from the past 240 hour(s))  MRSA PCR Screening     Status: None   Collection Time: 02/13/16 12:12 AM  Result Value Ref Range Status   MRSA by PCR NEGATIVE NEGATIVE Final    Comment:        The GeneXpert MRSA Assay (FDA approved for NASAL specimens only), is one component of a comprehensive MRSA colonization surveillance program. It is not intended to diagnose MRSA infection nor to guide or monitor treatment for MRSA infections.       Radiology Studies: Dg Chest Port 1 View  Result Date: 02/16/2016 CLINICAL DATA:  Hypoxia EXAM: PORTABLE CHEST 1 VIEW COMPARISON:  02/12/2016 FINDINGS: Prior median sternotomy. Stent graft noted within the descending thoracic aorta. There is cardiomegaly with bilateral airspace opacities and small layering effusions. Findings compatible with mild CHF. IMPRESSION: Continued mild CHF pattern with small bilateral layering effusions. Electronically Signed   By: Rolm Baptise M.D.   On: 02/16/2016 07:46     Scheduled Meds: . aspirin EC  81 mg Oral q morning - 10a  . atorvastatin  20 mg Oral QPM  . carvedilol  12.5 mg Oral BID WC  . cholecalciferol  2,000 Units Oral Daily  . diltiazem  180 mg Oral BH-q7a  . diphenhydrAMINE  25 mg Oral QHS  . doxazosin  2 mg Oral Daily  . ferrous gluconate  324 mg Oral Daily  . fluticasone  1 spray Each Nare BID  . furosemide  40 mg Oral Daily  . heparin  5,000 Units Subcutaneous Q8H  . insulin aspart  0-5 Units Subcutaneous QHS  . insulin aspart  0-9 Units Subcutaneous TID WC  . ipratropium  0.5 mg Nebulization Q6H  . isosorbide dinitrate  10 mg Oral TID  . levalbuterol  1.25 mg Nebulization Q6H  . loratadine  10 mg Oral Daily  . multivitamins with iron  1 tablet Oral Daily  . niacin  500 mg Oral Daily  . omega-3 acid ethyl esters  1 g Oral Daily  . pantoprazole  40 mg Oral  Daily  . sodium chloride flush  3 mL Intravenous Q12H  . sodium chloride flush  3 mL Intravenous Q12H  . temazepam  30 mg Oral QHS  Continuous Infusions:    LOS: 5 days   Time Spent in minutes   30 minutes  Nalany Steedley D.O. on 02/17/2016 at 11:22 AM  Between 7am to 7pm - Pager - [phone]  After 7pm go to [email or web address] - password [email or web address]  And look for the night coverage person covering for me after hours  [facility] Office  [phone]

## 2016-02-17 NOTE — Progress Notes (Signed)
Patient looks better at this time and was able to take his Bipap off and place him on O2 via High flow 6 liters but his sats are in the upper 90's now and his lungs sound better. Patient is voiding small amounts of clear yellow urine but often, B/P and HR are up some and patient was given Cardizem, will cont. To monitor

## 2016-02-17 NOTE — Progress Notes (Signed)
Physical Therapy Treatment Patient Details Name: [name] MRN: [id number] DOB: 23-Oct-1930 Today's Date: 02/17/2016    History of Present Illness Patient is a 82 y/o male with hx of CHF, HTN, CVA, PAD, DM, MI, Rt THA in 2003, chronic combined systolic and diastolic CHF presents with SOB. Found to have Acute respiratory failure with hypoxia likely secondary to CHF exacerbation. s/p cardiac cath.    PT Comments    Patient now on high flow oxygen. Requires Max A to stand from low chair and Min A for gait training. Pt fatiguing quickly during mobility and demonstrates DOE. Pt's medical and functional status seems to have worsened from last week as pt now requiring supplemental 02. Son present during session and does not think he can provide the necessary assist at home. Palliative are meeting planned for today. Discharge recommendation updated to SNF. Will follow.   Follow Up Recommendations  SNF;Supervision for mobility/OOB;Supervision/Assistance - 24 hour     Equipment Recommendations  None recommended by PT    Recommendations for Other Services       Precautions / Restrictions Precautions Precautions: Fall Required Braces or Orthoses: Sling Restrictions Weight Bearing Restrictions: No Other Position/Activity Restrictions: Has sling RUE secondary to recent fx. No orders but maintained NWB on RUE.    Mobility  Bed Mobility               General bed mobility comments: Pt OOB in chair upon arrival.  Transfers Overall transfer level: Needs assistance Equipment used: Rolling walker (2 wheeled) Transfers: Sit to/from Stand Sit to Stand: Max assist;Mod assist         General transfer comment: Assist to boost from low chair with cues for hand placement and use of momentum to stand. Stood from Automotive engineer.  Ambulation/Gait Ambulation/Gait assistance: Min assist Ambulation Distance (Feet): 25 Feet (+ 25') Assistive device: Rolling walker (2 wheeled) Gait  Pattern/deviations: Step-through pattern;Shuffle;Narrow base of support;Trunk flexed Gait velocity: decreased   General Gait Details: Slow, shuffling like gait with 1 UE using RW. Some assist with guiding RW during turns. 3/4 DOE. Ambulated on high flow 8L/min 02, Sp02 ranged from 89-100%. 1 seated rest break. Fatigues.   Stairs            Wheelchair Mobility    Modified Rankin (Stroke Patients Only)       Balance Overall balance assessment: Needs assistance Sitting-balance support: Feet supported;No upper extremity supported Sitting balance-Leahy Scale: Fair Sitting balance - Comments: Able to scoot to edge of chair without assist with effort.   Standing balance support: During functional activity Standing balance-Leahy Scale: Poor Standing balance comment: Reliant on UE support ion standing.                    Cognition Arousal/Alertness: Awake/alert Behavior During Therapy: WFL for tasks assessed/performed Overall Cognitive Status: Within Functional Limits for tasks assessed                      Exercises      General Comments General comments (skin integrity, edema, etc.): Son present during session.      Pertinent Vitals/Pain Pain Assessment: No/denies pain    Home Living                      Prior Function            PT Goals (current goals can now be found in the care plan section) Progress towards PT  goals: Progressing toward goals (slowly with some setbacks.)    Frequency    Min 3X/week      PT Plan Discharge plan needs to be updated    Co-evaluation             End of Session Equipment Utilized During Treatment: Gait belt;Other (comment) (sling) Activity Tolerance: Patient limited by fatigue Patient left: in chair;with call bell/phone within reach;with family/visitor present     Time: 1331-1400 PT Time Calculation (min) (ACUTE ONLY): 29 min  Charges:  $Gait Training: 23-37 mins                    G  Codes:      Laniyah Rosenwald A Ferris Tally 02/17/2016, 2:07 PM Wray Kearns, [place], DPT [phone]

## 2016-02-17 NOTE — Progress Notes (Signed)
Pharmacist Heart Failure Core Measure Documentation  Assessment: Christopher CIOFFI has an EF documented as 35-40 % on 11/19 by ECHO report.  Rationale: Heart failure patients with left ventricular systolic dysfunction (LVSD) and an EF < 40% should be prescribed an angiotensin converting enzyme inhibitor (ACEI) or angiotensin receptor blocker (ARB) at discharge unless a contraindication is documented in the medical record.  This patient is not currently on an ACEI or ARB for HF.  This note is being placed in the record in order to provide documentation that a contraindication to the use of these agents is present for this encounter.  ACE Inhibitor or Angiotensin Receptor Blocker is contraindicated (specify all that apply)  []   ACEI allergy AND ARB allergy []   Angioedema []   Moderate or severe aortic stenosis [x]   Hyperkalemia []   Hypotension []   Renal artery stenosis [x]   Worsening renal function, preexisting renal disease or dysfunction   Carlean Jews, Pharm.D. PGY1 Pharmacy Resident 11/19/20172:15 PM Pager [phone]

## 2016-02-17 NOTE — Progress Notes (Signed)
DAILY PROGRESS NOTE  Subjective:  Episode of desat overnight with tachypnea again. He says it is after trying to transfer from bed to chair. Somewhat anxious and "panting". Given lasix and bipap, but not volume overloaded. Creatinine is now increased today. Echo shows LVEF of 35-40% with global hypokinesis and inferolateral akinesis. Plan for palliative care evaluation today.  Objective:  Temp:  [97.7 F (36.5 C)-98.4 F (36.9 C)] 97.7 F (36.5 C) (11/19 0758) Pulse Rate:  [101-118] 114 (11/19 0747) Resp:  [22-26] 23 (11/19 0747) BP: (115-160)/(56-99) 138/98 (11/19 0747) SpO2:  [93 %-100 %] 100 % (11/19 0747) Weight:  [146 lb 9.7 oz (66.5 kg)] 146 lb 9.7 oz (66.5 kg) (11/19 0450) Weight change: -4 lb 12.7 oz (-2.175 kg)  Intake/Output from previous day: 11/18 0701 - 11/19 0700 In: 700 [P.O.:700] Out: 1451 [Urine:1450; Stool:1]  Intake/Output from this shift: Total I/O In: 240 [P.O.:240] Out: -   Medications: No current facility-administered medications on file prior to encounter.    Current Outpatient Prescriptions on File Prior to Encounter  Medication Sig Dispense Refill  . amoxicillin (AMOXIL) 500 MG capsule Take 2,000 mg by mouth as needed (1 hour before dental appointments).   1  . aspirin EC 81 MG tablet Take 81 mg by mouth every morning.     Marland Kitchen atorvastatin (LIPITOR) 40 MG tablet Take 20 mg by mouth every evening.     Marland Kitchen CARTIA XT 240 MG 24 hr capsule Take 240 mg by mouth every morning.    . carvedilol (COREG) 6.25 MG tablet Take 6.25 mg by mouth 2 (two) times daily with a meal.    . Cholecalciferol (VITAMIN D) 2000 UNITS tablet Take 2,000 Units by mouth daily.    . diphenhydrAMINE (BENADRYL) 25 MG tablet Take 25 mg by mouth at bedtime.    Marland Kitchen doxazosin (CARDURA) 2 MG tablet Take 2 mg by mouth daily.     . Ferrous Gluconate (IRON) 246 (28 FE) MG TABS Take 2 tablets by mouth daily.    . fluticasone (FLONASE) 50 MCG/ACT nasal spray Place 1 spray into the nose 2 (two)  times daily.     . furosemide (LASIX) 20 MG tablet Take 20 mg by mouth every other day. Take 1 tab every other day depending on weight gain    . glimepiride (AMARYL) 2 MG tablet Take 1 mg by mouth daily as needed (for blood sugar over 135 (1/8 of a 2 mg tablet)).     . isosorbide dinitrate (ISORDIL) 10 MG tablet Take 1 tablet (10 mg total) by mouth 3 (three) times daily. 90 tablet 2  . loratadine (CLARITIN) 10 MG tablet Take 10 mg by mouth daily.    . Multiple Vitamin (MULTIVITAMIN) capsule Take 1 capsule by mouth daily.    . niacin (SLO-NIACIN) 500 MG tablet Take 500 mg by mouth daily.     . Omega-3 Fatty Acids (FISH OIL) 1200 MG CAPS Take 4,800 mg by mouth every morning.     Marland Kitchen omeprazole (PRILOSEC) 20 MG capsule Take 20 mg by mouth daily.    . polyethylene glycol (MIRALAX / GLYCOLAX) packet Take 17 g by mouth every evening.     Marland Kitchen PREVIDENT 5000 BOOSTER PLUS 1.1 % PSTE Take 1 application by mouth every evening.  Use once in place of toothpaste brush 2 minutes then spit. DO NOT RINSE  0  . temazepam (RESTORIL) 30 MG capsule Take 30 mg by mouth at bedtime. For sleep    .  HYDROcodone-acetaminophen (NORCO/VICODIN) 5-325 MG tablet Take 1 tablet by mouth every 6 (six) hours. (Patient not taking: Reported on 02/12/2016) 30 tablet 0    Physical Exam: General appearance: alert, no distress and on nasal cannula Lungs: diminished breath sounds bibasilar Heart: regular rate and rhythm Extremities: extremities normal, atraumatic, no cyanosis or edema Neurologic: Grossly normal  Lab Results: Results for orders placed or performed during the hospital encounter of 02/12/16 (from the past 48 hour(s))  Glucose, capillary     Status: Abnormal   Collection Time: 02/15/16 11:37 AM  Result Value Ref Range   Glucose-Capillary 165 (H) 65 - 99 mg/dL   Comment 1 Document in Chart   I-STAT 3, arterial blood gas (G3+)     Status: Abnormal   Collection Time: 02/15/16 12:59 PM  Result Value Ref Range   pH,  Arterial 7.398 7.350 - 7.450   pCO2 arterial 32.5 32.0 - 48.0 mmHg   pO2, Arterial 74.0 (L) 83.0 - 108.0 mmHg   Bicarbonate 20.0 20.0 - 28.0 mmol/L   TCO2 21 0 - 100 mmol/L   O2 Saturation 95.0 %   Acid-base deficit 4.0 (H) 0.0 - 2.0 mmol/L   Patient temperature HIDE    Sample type ARTERIAL   POCT Activated clotting time     Status: None   Collection Time: 02/15/16  1:25 PM  Result Value Ref Range   Activated Clotting Time 114 seconds  I-STAT 3, venous blood gas (G3P V)     Status: Abnormal   Collection Time: 02/15/16  1:30 PM  Result Value Ref Range   pH, Ven 7.361 7.250 - 7.430   pCO2, Ven 38.1 (L) 44.0 - 60.0 mmHg   pO2, Ven 24.0 (LL) 32.0 - 45.0 mmHg   Bicarbonate 21.5 20.0 - 28.0 mmol/L   TCO2 23 0 - 100 mmol/L   O2 Saturation 41.0 %   Acid-base deficit 4.0 (H) 0.0 - 2.0 mmol/L   Patient temperature HIDE    Sample type VENOUS    Comment NOTIFIED PHYSICIAN   I-STAT 3, venous blood gas (G3P V)     Status: Abnormal   Collection Time: 02/15/16  1:36 PM  Result Value Ref Range   pH, Ven 7.371 7.250 - 7.430   pCO2, Ven 37.6 (L) 44.0 - 60.0 mmHg   pO2, Ven 27.0 (LL) 32.0 - 45.0 mmHg   Bicarbonate 21.8 20.0 - 28.0 mmol/L   TCO2 23 0 - 100 mmol/L   O2 Saturation 49.0 %   Acid-base deficit 3.0 (H) 0.0 - 2.0 mmol/L   Patient temperature HIDE    Sample type VENOUS    Comment NOTIFIED PHYSICIAN   Glucose, capillary     Status: Abnormal   Collection Time: 02/15/16  2:12 PM  Result Value Ref Range   Glucose-Capillary 159 (H) 65 - 99 mg/dL  CBC     Status: Abnormal   Collection Time: 02/15/16  3:21 PM  Result Value Ref Range   WBC 5.9 4.0 - 10.5 K/uL   RBC 2.85 (L) 4.22 - 5.81 MIL/uL   Hemoglobin 9.1 (L) 13.0 - 17.0 g/dL   HCT 28.1 (L) 39.0 - 52.0 %   MCV 98.6 78.0 - 100.0 fL   MCH 31.9 26.0 - 34.0 pg   MCHC 32.4 30.0 - 36.0 g/dL   RDW 13.6 11.5 - 15.5 %   Platelets 127 (L) 150 - 400 K/uL  Creatinine, serum     Status: Abnormal   Collection Time: 02/15/16  3:21 PM  Result Value Ref Range   Creatinine, Ser 1.66 (H) 0.61 - 1.24 mg/dL   GFR calc non Af Amer 37 (L) >60 mL/min   GFR calc Af Amer 42 (L) >60 mL/min    Comment: (NOTE) The eGFR has been calculated using the CKD EPI equation. This calculation has not been validated in all clinical situations. eGFR's persistently <60 mL/min signify possible Chronic Kidney Disease.   Glucose, capillary     Status: Abnormal   Collection Time: 02/15/16  4:57 PM  Result Value Ref Range   Glucose-Capillary 163 (H) 65 - 99 mg/dL   Comment 1 Document in Chart   Glucose, capillary     Status: Abnormal   Collection Time: 02/15/16  9:24 PM  Result Value Ref Range   Glucose-Capillary 156 (H) 65 - 99 mg/dL  Basic metabolic panel     Status: Abnormal   Collection Time: 02/16/16  4:11 AM  Result Value Ref Range   Sodium 139 135 - 145 mmol/L   Potassium 4.0 3.5 - 5.1 mmol/L   Chloride 105 101 - 111 mmol/L   CO2 23 22 - 32 mmol/L   Glucose, Bld 165 (H) 65 - 99 mg/dL   BUN 49 (H) 6 - 20 mg/dL   Creatinine, Ser 1.80 (H) 0.61 - 1.24 mg/dL   Calcium 9.2 8.9 - 10.3 mg/dL   GFR calc non Af Amer 33 (L) >60 mL/min   GFR calc Af Amer 38 (L) >60 mL/min    Comment: (NOTE) The eGFR has been calculated using the CKD EPI equation. This calculation has not been validated in all clinical situations. eGFR's persistently <60 mL/min signify possible Chronic Kidney Disease.    Anion gap 11 5 - 15  CBC     Status: Abnormal   Collection Time: 02/16/16  4:11 AM  Result Value Ref Range   WBC 7.0 4.0 - 10.5 K/uL   RBC 2.92 (L) 4.22 - 5.81 MIL/uL   Hemoglobin 9.4 (L) 13.0 - 17.0 g/dL   HCT 28.8 (L) 39.0 - 52.0 %   MCV 98.6 78.0 - 100.0 fL   MCH 32.2 26.0 - 34.0 pg   MCHC 32.6 30.0 - 36.0 g/dL   RDW 13.9 11.5 - 15.5 %   Platelets 133 (L) 150 - 400 K/uL  Glucose, capillary     Status: Abnormal   Collection Time: 02/16/16  7:43 AM  Result Value Ref Range   Glucose-Capillary 160 (H) 65 - 99 mg/dL   Comment 1 Document in Chart    Glucose, capillary     Status: Abnormal   Collection Time: 02/16/16 12:20 PM  Result Value Ref Range   Glucose-Capillary 161 (H) 65 - 99 mg/dL   Comment 1 Document in Chart   Glucose, capillary     Status: Abnormal   Collection Time: 02/16/16  4:27 PM  Result Value Ref Range   Glucose-Capillary 247 (H) 65 - 99 mg/dL  Glucose, capillary     Status: Abnormal   Collection Time: 02/16/16  9:50 PM  Result Value Ref Range   Glucose-Capillary 177 (H) 65 - 99 mg/dL  Basic metabolic panel     Status: Abnormal   Collection Time: 02/17/16  3:00 AM  Result Value Ref Range   Sodium 136 135 - 145 mmol/L   Potassium 4.0 3.5 - 5.1 mmol/L   Chloride 103 101 - 111 mmol/L   CO2 20 (L) 22 - 32 mmol/L   Glucose, Bld 222 (H) 65 - 99 mg/dL  BUN 67 (H) 6 - 20 mg/dL   Creatinine, Ser 2.35 (H) 0.61 - 1.24 mg/dL   Calcium 9.0 8.9 - 10.3 mg/dL   GFR calc non Af Amer 24 (L) >60 mL/min   GFR calc Af Amer 28 (L) >60 mL/min    Comment: (NOTE) The eGFR has been calculated using the CKD EPI equation. This calculation has not been validated in all clinical situations. eGFR's persistently <60 mL/min signify possible Chronic Kidney Disease.    Anion gap 13 5 - 15  CBC     Status: Abnormal   Collection Time: 02/17/16  3:00 AM  Result Value Ref Range   WBC 8.6 4.0 - 10.5 K/uL   RBC 3.04 (L) 4.22 - 5.81 MIL/uL   Hemoglobin 9.7 (L) 13.0 - 17.0 g/dL   HCT 30.0 (L) 39.0 - 52.0 %   MCV 98.7 78.0 - 100.0 fL   MCH 31.9 26.0 - 34.0 pg   MCHC 32.3 30.0 - 36.0 g/dL   RDW 13.7 11.5 - 15.5 %   Platelets 141 (L) 150 - 400 K/uL  Troponin I (q 6hr x 3)     Status: Abnormal   Collection Time: 02/17/16  3:00 AM  Result Value Ref Range   Troponin I 0.36 (HH) <0.03 ng/mL    Comment: CRITICAL VALUE NOTED.  VALUE IS CONSISTENT WITH PREVIOUSLY REPORTED AND CALLED VALUE.  Brain natriuretic peptide     Status: Abnormal   Collection Time: 02/17/16  3:00 AM  Result Value Ref Range   B Natriuretic Peptide 1,043.2 (H) 0.0 -  100.0 pg/mL  Glucose, capillary     Status: Abnormal   Collection Time: 02/17/16  7:50 AM  Result Value Ref Range   Glucose-Capillary 267 (H) 65 - 99 mg/dL    Imaging: Dg Chest Port 1 View  Result Date: 02/16/2016 CLINICAL DATA:  Hypoxia EXAM: PORTABLE CHEST 1 VIEW COMPARISON:  02/12/2016 FINDINGS: Prior median sternotomy. Stent graft noted within the descending thoracic aorta. There is cardiomegaly with bilateral airspace opacities and small layering effusions. Findings compatible with mild CHF. IMPRESSION: Continued mild CHF pattern with small bilateral layering effusions. Electronically Signed   By: Rolm Baptise M.D.   On: 02/16/2016 07:46    Assessment:  Principal Problem:   Acute on chronic combined systolic and diastolic CHF (congestive heart failure) (HCC) Active Problems:   Essential hypertension   Dyslipidemia associated with type 2 diabetes mellitus (HCC)   Diabetes mellitus, type 2 ([place])   NSTEMI, initial episode of care [facility])   Acute respiratory failure with hypoxia (HCC)   Elevated troponin   Acute renal failure superimposed on stage 3 chronic kidney disease ([place])   Hypoxia   Plan:  1. Appreciate palliative care recommendations today. He desats and is very symptomatic with minimal exertion, therefore, he is likely to require full time care if he were to go home. A better plan may be a skilled nursing facility with hospice services. He is DNR and an out of hospital DNR was completed. Would focus on symptoms if he has another episode of hypoxia or tachypnea overnight, consider ativan or morphine, rather than diuretics and BIPAP. Hopeful to d/c tomorrow if services can be arranged.  Time Spent Directly with Patient:  15 minutes  Length of Stay:  LOS: 5 days   Pixie Casino, MD, [facility] Attending Cardiologist [facility] [place] 02/17/2016, 9:04 AM

## 2016-02-18 ENCOUNTER — Encounter (HOSPITAL_COMMUNITY): Payer: Self-pay | Admitting: Cardiology

## 2016-02-18 LAB — CBC
HCT: 28.4 % — ABNORMAL LOW (ref 39.0–52.0)
Hemoglobin: 9.2 g/dL — ABNORMAL LOW (ref 13.0–17.0)
MCH: 31.8 pg (ref 26.0–34.0)
MCHC: 32.4 g/dL (ref 30.0–36.0)
MCV: 98.3 fL (ref 78.0–100.0)
PLATELETS: 131 10*3/uL — AB (ref 150–400)
RBC: 2.89 MIL/uL — ABNORMAL LOW (ref 4.22–5.81)
RDW: 13.5 % (ref 11.5–15.5)
WBC: 9.8 10*3/uL (ref 4.0–10.5)

## 2016-02-18 LAB — BASIC METABOLIC PANEL
Anion gap: 12 (ref 5–15)
BUN: 84 mg/dL — AB (ref 6–20)
CALCIUM: 8.6 mg/dL — AB (ref 8.9–10.3)
CO2: 22 mmol/L (ref 22–32)
CREATININE: 2.74 mg/dL — AB (ref 0.61–1.24)
Chloride: 103 mmol/L (ref 101–111)
GFR calc non Af Amer: 20 mL/min — ABNORMAL LOW (ref >60)
GFR, EST AFRICAN AMERICAN: 23 mL/min — AB (ref >60)
Glucose, Bld: 198 mg/dL — ABNORMAL HIGH (ref 65–99)
Potassium: 3.8 mmol/L (ref 3.5–5.1)
SODIUM: 137 mmol/L (ref 135–145)

## 2016-02-18 LAB — GLUCOSE, CAPILLARY
GLUCOSE-CAPILLARY: 243 mg/dL — AB (ref 65–99)
GLUCOSE-CAPILLARY: 227 mg/dL — AB (ref 65–99)
Glucose-Capillary: 281 mg/dL — ABNORMAL HIGH (ref 65–99)

## 2016-02-18 MED ORDER — ALPRAZOLAM 0.25 MG PO TABS: 0.2500 mg | ORAL_TABLET | Freq: Two times a day (BID) | ORAL | 0 refills | Status: AC | PRN

## 2016-02-18 MED ORDER — MORPHINE SULFATE (CONCENTRATE) 10 MG/0.5ML PO SOLN: 5.0000 mg | ORAL | Status: AC | PRN

## 2016-02-18 MED ORDER — LEVALBUTEROL HCL 1.25 MG/0.5ML IN NEBU
1.2500 mg | INHALATION_SOLUTION | Freq: Two times a day (BID) | RESPIRATORY_TRACT | Status: DC
  Administered 2016-02-18: 1.25 mg via RESPIRATORY_TRACT
  Filled 2016-02-18: qty 0.5

## 2016-02-18 MED ORDER — IPRATROPIUM BROMIDE 0.02 % IN SOLN
0.5000 mg | Freq: Two times a day (BID) | RESPIRATORY_TRACT | Status: DC
  Administered 2016-02-18: 0.5 mg via RESPIRATORY_TRACT
  Filled 2016-02-18: qty 2.5

## 2016-02-18 MED ORDER — DILTIAZEM HCL ER COATED BEADS 180 MG PO CP24: 180.0000 mg | ORAL_CAPSULE | ORAL | Status: AC

## 2016-02-18 MED ORDER — CARVEDILOL 12.5 MG PO TABS: 12.5000 mg | ORAL_TABLET | Freq: Two times a day (BID) | ORAL | Status: AC

## 2016-02-18 NOTE — Care Management Important Message (Signed)
Important Message  Patient Details  Name: [name] MRN: [id number] Date of Birth: [birth year]   Medicare Important Message Given:  Yes    Nathen May 02/18/2016, 10:41 AM

## 2016-02-18 NOTE — Progress Notes (Signed)
Physical Therapy Treatment Patient Details Name: [name] MRN: [id number] DOB: 1930/07/28 Today's Date: 02/18/2016    History of Present Illness Patient is a 82 y/o male with hx of CHF, HTN, CVA, PAD, DM, MI, Rt THA in 2003, chronic combined systolic and diastolic CHF presents with SOB. Found to have Acute respiratory failure with hypoxia likely secondary to CHF exacerbation. s/p cardiac cath.    PT Comments    Patient was seen in recliner upon arrival. Performed sit to stand x +2 side by side assist with  VC's for LUE placement to push up from chair when standing.  Blocked feet to avoid sliding when performing sit to stand.  Required VC's/maxA for controlled decent. Gait x 10 ft ( 48ft, 20ft.) x min/modA. Patient displayed slow, shuffling, unstable gait with the use of 2 person side by side hand held assist. Patient was on 4L/min O2. O2 following ambulation was 95%, HR following ambulation 110 bpm. See Vitals for Details. Required 1 seated rest break and displayed a 3/4 DOE. Patient is limited due to decreased endurance and decreased activity tolerance.    Follow Up Recommendations  SNF;Supervision for mobility/OOB;Supervision/Assistance - 24 hour  [facility]   Equipment Recommendations  None recommended by PT    Recommendations for Other Services       Precautions / Restrictions Precautions Precautions: Fall Required Braces or Orthoses: Sling Restrictions Weight Bearing Restrictions: No Other Position/Activity Restrictions: Has sling RUE secondary to recent fx. No orders but maintained NWB on RUE.    Mobility  Bed Mobility               General bed mobility comments: Pt OOB in chair upon arrival.  Transfers Overall transfer level: Needs assistance Equipment used: 1 person hand held assist Transfers: Sit to/from Stand Sit to Stand: Max assist         General transfer comment: VC's required for LUE placement to push up from chair when  standing.  Blocked feet to avoid sliding when performing sit to stand. +2 side by side assist to perform sit to stand. VC's/maxA for controlled decent.   Ambulation/Gait Ambulation/Gait assistance: Min assist;Mod assist Ambulation Distance (Feet): 10 Feet (5 ft, 5 ft.) Assistive device: 2 person hand held assist Gait Pattern/deviations: Shuffle;Narrow base of support;Trunk flexed;Decreased step length - left;Decreased step length - right;Step-through pattern Gait velocity: decreased Gait velocity interpretation: Below normal speed for age/gender General Gait Details: Patient displayed slow, shuffling gait during ambulation with the use of 2 person side by side hand held assist. On 4L/min O2. O2 following ambulation was 95%, HR following ambulation 110 bpm. Required 1 seated rest break. 3/4 DOE.    Stairs            Wheelchair Mobility    Modified Rankin (Stroke Patients Only)       Balance     Sitting balance-Leahy Scale: Fair Sitting balance - Comments: pt able to scoot forward and back in chair with increased time     Standing balance-Leahy Scale: Poor                      Cognition Arousal/Alertness: Lethargic Behavior During Therapy: WFL for tasks assessed/performed Overall Cognitive Status: Within Functional Limits for tasks assessed                      Exercises      General Comments        Pertinent  Vitals/Pain Pain Assessment: No/denies pain    Home Living                      Prior Function            PT Goals (current goals can now be found in the care plan section) Progress towards PT goals: Progressing toward goals    Frequency    Min 3X/week      PT Plan Current plan remains appropriate    Co-evaluation             End of Session Equipment Utilized During Treatment: Gait belt;Other (comment) Activity Tolerance: Patient limited by fatigue Patient left: in chair;with call bell/phone within reach      Time: 1325-1342 PT Time Calculation (min) (ACUTE ONLY): 17 min  Charges:  $Gait Training: 8-22 mins                    G Codes:      [place], SPTA [facility] Acute Rehab [phone]  Rica Koyanagi  PTA [facility]  Acute  Rehab Pager      [phone]

## 2016-02-18 NOTE — Progress Notes (Signed)
Occupational Therapy Treatment Patient Details Name: [name] MRN: [id number] DOB: 1931/03/07 Today's Date: 02/18/2016    History of present illness Patient is a 82 y/o male with hx of CHF, HTN, CVA, PAD, DM, MI, Rt THA in 2003, chronic combined systolic and diastolic CHF presents with SOB. Found to have Acute respiratory failure with hypoxia likely secondary to CHF exacerbation. s/p cardiac cath.   OT comments  Pt with poor activity tolerance and closing his eyes when not stimulated. Requiring max assist to stand from low chair. Not able to stand for more than one activity at sink without fatiguing, but completed oral care and washing his face seated in chair. ROM R elbow to hand performed and repositioned in sling. Pt now requiring high flow oxygen and sounding congested. Per SW note, family is requesting residential hospice placement.   Follow Up Recommendations  Supervision/Assistance - 24 hour;SNF    Equipment Recommendations  None recommended by OT    Recommendations for Other Services      Precautions / Restrictions Precautions Precautions: Fall Required Braces or Orthoses: Sling Restrictions Weight Bearing Restrictions: No Other Position/Activity Restrictions: Has sling RUE secondary to recent fx. No orders but maintained NWB on RUE.       Mobility Bed Mobility               General bed mobility comments: Pt OOB in chair upon arrival.  Transfers Overall transfer level: Needs assistance Equipment used: 1 person hand held assist Transfers: Sit to/from Stand Sit to Stand: Max assist         General transfer comment: cues to push up with L UE on arm of chair, max assist from low chair to rise and steady, use of momentum    Balance     Sitting balance-Leahy Scale: Fair Sitting balance - Comments: pt able to scoot forward and back in chair with increased time     Standing balance-Leahy Scale: Poor                     ADL Overall ADL's :  Needs assistance/impaired     Grooming: Oral care;Wash/dry hands;Wash/dry face;Sitting;Standing;Minimal assistance Grooming Details (indicate cue type and reason): performed one activity in standing only, required sitting to complete other 2 due to fatigue         Upper Body Dressing : Maximal assistance Upper Body Dressing Details (indicate cue type and reason): for sling management                   General ADL Comments: performed ROM R elbow to hand and repositioned R UE in immobilizer with pillow supporting elbow.      Vision                     Perception     Praxis      Cognition   Behavior During Therapy: WFL for tasks assessed/performed Overall Cognitive Status: Within Functional Limits for tasks assessed                       Extremity/Trunk Assessment               Exercises     Shoulder Instructions       General Comments      Pertinent Vitals/ Pain       Pain Assessment: No/denies pain  Home Living  Prior Functioning/Environment              Frequency  Min 2X/week        Progress Toward Goals  OT Goals(current goals can now be found in the care plan section)  Progress towards OT goals: Not progressing toward goals - comment (pt with decreased activity tolerance)  Acute Rehab OT Goals Time For Goal Achievement: 02/28/16 Potential to Achieve Goals: [place] Discharge plan needs to be updated    Co-evaluation                 End of Session Equipment Utilized During Treatment: Oxygen;Gait belt (sling)   Activity Tolerance Patient limited by fatigue (immediately falls asleep when not stimulated)   Patient Left in chair;with call bell/phone within reach;with chair alarm set;with nursing/sitter in room   Nurse Communication          Time: [id number] OT Time Calculation (min): 18 min  Charges: OT General Charges $OT Visit: 1  Procedure OT Treatments $Self Care/Home Management : 8-22 mins  Malka So 02/18/2016, 11:17 AM  [phone]

## 2016-02-18 NOTE — Progress Notes (Signed)
Updated report received via Ebony Hail RN in patient's room, reviewed new orders, POC and events of the day, assumed care of patient.

## 2016-02-18 NOTE — Discharge Summary (Signed)
Physician Discharge Summary  [name] [id number] DOB: 14-Jan-1931 DOA: 02/12/2016  PCP: [name], MD  Admit date: 02/12/2016 Discharge date: 02/18/2016  Time spent: 45 minutes  Recommendations for Outpatient Follow-up:  [name] will be discharged to [facility].  Follow up with primary care physician as needed.  [name] should follow a heart healthy/carb modified diet.   Discharge Diagnoses:  Acute respiratory failure with hypoxia likely secondary to CHF exacerbation Acute on chronic combined systolic and diastolic heart failure Elevated troponin/NSTEMI Acute on chronic kidney disease, stage III Chronic normocytic anemia Diabetes mellitus, type II Hyperlipidemia Hypertension Right humeral fracture status post fall GERD Goals of care  Discharge Condition: Stable  Diet recommendation: heart healthy/carb modified die  Filed Weights   02/16/16 0530 02/17/16 0450 02/18/16 0500  Weight: 68.7 kg (151 lb 6.4 oz) 66.5 kg (146 lb 9.7 oz) 62.2 kg (137 lb 3.2 oz)    History of present illness:  on 02/12/2016 by Dr. Darden [name] a 82 y.o.malewith medical history significant of chronic combined systolic and diastolic CHF, hypertension, hyperlipidemia, diabetes mellitus, GERD, stroke, PAD, CAD, formal smoker, anemia, CKD-III, whopresents with shortness of breath.  [name] states that she has been having shortness of breath for a week, which has been progressively getting worse. It is aggravated by exertion. [name] was on Lasix 20 mg every otherdays, which was increased to 40 mg daily by his PCP without significant help. [name] has mild dry cough, but no chest pain fever or chills. She does not have nausea, vomiting, abdominal pain, diarrhea, symptoms of UTI. No new unilateral weakness, vision change or hearing loss. He has bilateral leg edema. Of note, pt has right upper fracture wearing splint, has mild pain.  Hospital Course:  Acute  respiratory failure with hypoxia likely secondary to CHF exacerbation -[name] noted to have oxygen saturations of 91% on room air however was placed on BiPAP in the emergency department, O2 sats improved to 93% -Was placed on BiPAP and given IV lasix overnight due to SOB -Currently requiring hiflow oxygen. Difficult to wean as [name] feels SOB on room air although O2 sats are stable.  -?SOB has some component of anxiety -Continue xanax, morphine as needed  Acute on chronic combined systolic and diastolic heart failure -Chest x-ray showed degree of congestive heart failure -BNP 887 -Echocardiogram 10/22/2015 showed an EF of Q000111Q, grade 2 diastolic dysfunction -Possibly related to worsening coronary artery disease as [name] does have elevated troponin -Cardiology consulted and appreciated -Plan for right and left heart cath once creatinine has improved -[name] currently not a candidate for ACEi/ARB/Entresto due to AK I and history of hyperkalemia -Continue to monitor daily weights, intake, output -Urine output 1450cc over past 24hrs, weight down 8lbs since admission -[name] given some fluid yesterday for AKI and Cath, had some shortness of breath overnight and received lasix IV -Continue lasix for comfort  Elevated troponin/NSTEMI -Troponin peaked at 1.05 -Continue IV heparin -Continue aspirin, statin -s/p R/LHC: severe disease 50% calcified LM disease, 95% ostial LCX disease and occluded RCA with left to right collaterals -[name] not a candidate for surgery.  -Spoke with [name] and son regarding code status and possibly Hospice.  Palliative care consulted.  -Continued Coreg and cartia for comfort  Acute on chronic kidney disease, stage III -Baseline creatinine 1.3-1.4 -Upon admission creatinine 1.91 -Creatinine currently 2.35 (given that [name] has received IV lasix last night) -Continue to monitor BMP  Chronic normocytic anemia -Baseline hemoglobin approximate 10,  currently 9.2 -Continue  iron supplementation -Monitor CBC  Diabetes mellitus, type II -Continue insulin sliding scale, CBG monitoring  Hyperlipidemia -Continue statin  Hypertension -Continue Coreg, diltiazem, Isordil  Right humeral fracture status post fall -Currently in sling -[name] follows up with orthopedics -Continue pain control -PT recommended SNF  GERD -Continue PPI  Goals of care -Palliative care consulted and appreciated -Spoke with son, not sure he can take care of his dad and provide for him 24 hour care at home.   -Spoke with [name]'s son at bedside, agreed with hospice. Residential hospice referral made  Consultants Cardiology Palliative care  Procedures  [facility]  Discharge Exam: Vitals:   02/18/16 0748 02/18/16 1100  BP: (!) 116/53 106/61  Pulse:    Resp:    Temp:      Exam  General: Well developed, well nourished, no apparent distress  HEENT: NCAT, mucous membranes moist.   Cardiovascular: S1 S2 auscultated, 2/6SEM, RRR  Respiratory: Diminished breath sounds, but clear  Abdomen: Soft, nontender, nondistended, + bowel sounds  Extremities: warm dry without cyanosis clubbing. +LE edema  Neuro: AAOx3, nonfocal ([name] does have a speech impediment at baseline)  Psych: Appropriate mood and affect   Discharge Instructions Discharge Instructions    AMB Referral to [facility]    Complete by:  As directed    Reason for consult:  HF   Diagnoses of:   Heart Failure Diabetes     Expected date of contact:  1-3 days (reserved for hospital discharges)   Please assign to community nurse for transition of care calls and assess for home visits.  [name] has a home health agency but did not remember who.   Questions please call:   Natividad Brood, RN BSN [facility] Liaison  [phone] business mobile phone Toll free office [phone]     Current Discharge Medication List    START taking these  medications   Details  ALPRAZolam (XANAX) 0.25 MG tablet Take 1 tablet (0.25 mg total) by mouth 2 (two) times daily as needed for anxiety. Qty: 30 tablet, Refills: 0    Morphine Sulfate (MORPHINE CONCENTRATE) 10 MG/0.5ML SOLN concentrated solution Take 0.25 mLs (5 mg total) by mouth every 2 (two) hours as needed for moderate pain, severe pain or shortness of breath. Qty: 180 mL      CONTINUE these medications which have CHANGED   Details  carvedilol (COREG) 12.5 MG tablet Take 1 tablet (12.5 mg total) by mouth 2 (two) times daily with a meal.    diltiazem (CARDIZEM CD) 180 MG 24 hr capsule Take 1 capsule (180 mg total) by mouth every morning.      CONTINUE these medications which have NOT CHANGED   Details  furosemide (LASIX) 20 MG tablet Take 20 mg by mouth every other day. Take 1 tab every other day depending on weight gain    isosorbide dinitrate (ISORDIL) 10 MG tablet Take 1 tablet (10 mg total) by mouth 3 (three) times daily. Qty: 90 tablet, Refills: 2    omeprazole (PRILOSEC) 20 MG capsule Take 20 mg by mouth daily.    polyethylene glycol (MIRALAX / GLYCOLAX) packet Take 17 g by mouth every evening.       STOP taking these medications     amoxicillin (AMOXIL) 500 MG capsule      aspirin EC 81 MG tablet      atorvastatin (LIPITOR) 40 MG tablet      Cholecalciferol (VITAMIN D) 2000 UNITS tablet      diphenhydrAMINE (  BENADRYL) 25 MG tablet      doxazosin (CARDURA) 2 MG tablet      Ferrous Gluconate (IRON) 246 (28 FE) MG TABS      fluticasone (FLONASE) 50 MCG/ACT nasal spray      glimepiride (AMARYL) 2 MG tablet      loratadine (CLARITIN) 10 MG tablet      Multiple Vitamin (MULTIVITAMIN) capsule      niacin (SLO-NIACIN) 500 MG tablet      Omega-3 Fatty Acids (FISH OIL) 1200 MG CAPS      PREVIDENT 5000 BOOSTER PLUS 1.1 % PSTE      temazepam (RESTORIL) 30 MG capsule      HYDROcodone-acetaminophen (NORCO/VICODIN) 5-325 MG tablet        Allergies    Allergen Reactions  . Codeine Nausea And Vomiting   Follow-up Information    [facility] Follow up.   Specialty:  [place] Why:  Registered Nurse, Physical / Occupational Therapy Contact information: [place] [phone]            The results of significant diagnostics from this hospitalization (including imaging, microbiology, ancillary and laboratory) are listed below for reference.    Significant Diagnostic Studies: Dg Shoulder Right  Result Date: 01/25/2016 CLINICAL DATA:  Fall with pain and swelling EXAM: RIGHT SHOULDER - 2+ VIEW COMPARISON:  None. FINDINGS: Soft tissue swelling over the right shoulder. Superior migration of the right humoral head suggests rotator cuff disease. There is an oblique fracture of the proximal shaft/subcapital portion of the right humerus. There is overriding of the fracture fragments. There is moderate angular deformity of the distal fracture fragment. Humeral head does not appear dislocated. AC joint degenerative changes. IMPRESSION: Angulated, overriding fracture of the proximal humerus. Electronically Signed   By: Donavan Foil M.D.   On: 01/25/2016 20:41   Dg Elbow 2 Views Right  Result Date: 01/25/2016 CLINICAL DATA:  Fall was swelling and deformity in EXAM: RIGHT ELBOW - 2 VIEW COMPARISON:  None. FINDINGS: Two views of the right elbow show no large elbow effusion. Radial head alignment grossly normal. No obvious fracture. IMPRESSION: No gross fracture malalignment of the right elbow Electronically Signed   By: Donavan Foil M.D.   On: 01/25/2016 20:37   Dg Chest Port 1 View  Result Date: 02/16/2016 CLINICAL DATA:  Hypoxia EXAM: PORTABLE CHEST 1 VIEW COMPARISON:  02/12/2016 FINDINGS: Prior median sternotomy. Stent graft noted within the descending thoracic aorta. There is cardiomegaly with bilateral airspace opacities and small layering effusions. Findings compatible with mild CHF.  IMPRESSION: Continued mild CHF pattern with small bilateral layering effusions. Electronically Signed   By: Rolm Baptise M.D.   On: 02/16/2016 07:46   Dg Chest Port 1 View  Result Date: 02/12/2016 CLINICAL DATA:  Shortness of Breath EXAM: PORTABLE CHEST 1 VIEW COMPARISON:  Chest radiograph June 13, 2013 and chest CT June 14, 2014 FINDINGS: There is interstitial edema with small bilateral pleural effusions. There is mild patchy alveolar opacity in the right lower lobe region. Heart is borderline prominent with mild pulmonary venous hypertension. No adenopathy. The [name] has an aortic stent in place. Atherosclerotic calcification is also noted in the aorta. There is advanced arthropathy in the right shoulder with bony remodeling, stable. IMPRESSION: Evidence of a degree of congestive heart failure. Question alveolar edema versus superimposed pneumonia right base. Stable cardiac silhouette. Stent in descending thoracic aorta. Aortic atherosclerosis noted. Electronically Signed   By: Lowella Grip  III M.D.   On: 02/12/2016 20:59   Dg Humerus Right  Result Date: 01/25/2016 CLINICAL DATA:  Fall with arm pain and swelling EXAM: RIGHT HUMERUS - 2+ VIEW COMPARISON:  None. FINDINGS: Two views of the right humerus demonstrate an oblique fracture of the right humeral neck and proximal shaft. Fracture appears impacted, there is approximately 2.5 cm of overriding of fracture segments. Moderate angulation of posterior fracture fragment. The humeral head does not appear dislocated. The face min of the subacromial space presumed rotator cuff pathology. Soft tissue swelling over the shoulder. Bulky calcifications surrounding the right humeral head are again noted. There are suspected old right-sided rib fractures. There is degenerative change of the right AC joint. IMPRESSION: Overriding and angulated fracture of the proximal humerus. Suspect old right-sided rib fractures. Electronically Signed   By: Donavan Foil  M.D.   On: 01/25/2016 20:40   Xr Shoulder Right  Result Date: 02/06/2016 AP and Y view of the right shoulder: AP view shows severe right shoulder arthropathy. Fracture of the proximal humerus subcapital region with angulation and overriding fracture fragments. Humeral head appears appropriately oriented to the glenoid. Y view is of poor diagnostic quality.   Microbiology: Recent Results (from the past 240 hour(s))  MRSA PCR Screening     Status: None   Collection Time: 02/13/16 12:12 AM  Result Value Ref Range Status   MRSA by PCR NEGATIVE NEGATIVE Final    Comment:        The GeneXpert MRSA Assay (FDA approved for NASAL specimens only), is one component of a comprehensive MRSA colonization surveillance program. It is not intended to diagnose MRSA infection nor to guide or monitor treatment for MRSA infections.      Labs: Basic Metabolic Panel:  Recent Labs Lab 02/14/16 0539 02/15/16 0432 02/15/16 1521 02/16/16 0411 02/17/16 0300 02/18/16 0355  NA 137 139  --  139 136 137  K 3.4* 3.6  --  4.0 4.0 3.8  CL 101 103  --  105 103 103  CO2 26 24  --  23 20* 22  GLUCOSE 187* 145*  --  165* 222* 198*  BUN 43* 48*  --  49* 67* 84*  CREATININE 1.60* 1.73* 1.66* 1.80* 2.35* 2.74*  CALCIUM 8.9 9.2  --  9.2 9.0 8.6*   Liver Function Tests:  Recent Labs Lab 02/12/16 2011  AST 21  ALT 13*  ALKPHOS 200*  BILITOT 0.7  PROT 6.6  ALBUMIN 3.5   No results for input(s): LIPASE, AMYLASE in the last 168 hours. No results for input(s): AMMONIA in the last 168 hours. CBC:  Recent Labs Lab 02/12/16 2011  02/15/16 0432 02/15/16 1521 02/16/16 0411 02/17/16 0300 02/18/16 0355  WBC 6.7  < > 6.5 5.9 7.0 8.6 9.8  NEUTROABS 5.1  --   --   --   --   --   --   HGB 10.1*  < > 9.3* 9.1* 9.4* 9.7* 9.2*  HCT 29.8*  < > 27.7* 28.1* 28.8* 30.0* 28.4*  MCV 97.7  < > 96.9 98.6 98.6 98.7 98.3  PLT 139*  < > 139* 127* 133* 141* 131*  < > = values in this interval not  displayed. Cardiac Enzymes:  Recent Labs Lab 02/13/16 0427 02/13/16 1103 02/17/16 0300 02/17/16 0807 02/17/16 1404  TROPONINI 1.05* 0.98* 0.36* 0.25* 0.28*   BNP: BNP (last 3 results)  Recent Labs  02/12/16 2011 02/17/16 0300  BNP 887.3* 1,043.2*    ProBNP (last 3  results) No results for input(s): PROBNP in the last 8760 hours.  CBG:  Recent Labs Lab 02/17/16 1104 02/17/16 1739 02/17/16 2121 02/18/16 0727 02/18/16 1138  GLUCAP 256* 268* 281* 243* 227*       Signed:  Cristal Ford  [facility] 02/18/2016, 2:03 PM

## 2016-02-18 NOTE — Progress Notes (Signed)
PROGRESS NOTE    [name]  [id number] DOB: [birth year] DOA: 02/12/2016 PCP: [name], MD   Chief Complaint  Patient presents with  . Shortness of Breath    Brief Narrative:  HPI on 02/12/2016 by Dr. Ivor Costa [name] is a 82 y.o. male with medical history significant of chronic combined systolic and diastolic CHF, hypertension, hyperlipidemia, diabetes mellitus, GERD, stroke, PAD, CAD, formal smoker, anemia, CKD-III, who presents with shortness of breath.  Patient states that she has been having shortness of breath for a week, which has been progressively getting worse. It is aggravated by exertion. Patient was on Lasix 20 mg every other days, which was increased to 40 mg daily by his PCP without significant help. Patient has mild dry cough, but no chest pain fever or chills. She does not have nausea, vomiting, abdominal pain, diarrhea, symptoms of UTI. No new unilateral weakness, vision change or hearing loss. He has bilateral leg edema. Of note, pt has right upper fracture wearing splint, has mild pain. Assessment & Plan   Acute respiratory failure with hypoxia likely secondary to CHF exacerbation -Patient noted to have oxygen saturations of 91% on room air however was placed on BiPAP in the emergency department, O2 sats improved to 93% -Was placed on BiPAP and given IV lasix overnight due to SOB -Currently requiring hiflow oxygen. Difficult to wean as patient feels SOB on room air although O2 sats are stable.  -?SOB has some component of anxiety -Continue xanax, morphine as needed  Acute on chronic combined systolic and diastolic heart failure -Chest x-ray showed degree of congestive heart failure -BNP 887 -Echocardiogram 10/22/2015 showed an EF of Q000111Q, grade 2 diastolic dysfunction -Possibly related to worsening coronary artery disease as patient does have elevated troponin -Cardiology consulted and appreciated -Plan for right and left heart cath once  creatinine has improved -Patient currently not a candidate for ACEi/ARB/Entresto due to AK I and history of hyperkalemia -Continue to monitor daily weights, intake, output -Urine output 1450cc over past 24hrs, weight down 8lbs since admission -Patient given some fluid yesterday for AKI and Cath, had some shortness of breath overnight and received lasix IV -Continue lasix 40mg  PO daily  Elevated troponin/NSTEMI -Troponin peaked at 1.05 -Continue IV heparin -Continue aspirin, statin -s/p R/LHC: severe disease 50% calcified LM disease, 95% ostial LCX disease and occluded RCA with left to right collaterals -Patient not a candidate for surgery.  -Spoke with patient and son regarding code status and possibly Hospice. Palliative care consulted.   Acute on chronic kidney disease, stage III -Baseline creatinine 1.3-1.4 -Upon admission creatinine 1.91 -Creatinine currently 2.35 (given that patient has received IV lasix last night) -Continue to monitor BMP  Chronic normocytic anemia -Baseline hemoglobin approximate 10, currently 9.2 -Continue iron supplementation -Monitor CBC  Diabetes mellitus, type II -Continue insulin sliding scale, CBG monitoring  Hyperlipidemia -Continue statin  Hypertension -Continue Coreg, diltiazem, Isordil  Right humeral fracture status post fall -Currently in sling -Patient follows up with orthopedics -Continue pain control -PT recommended SNF  GERD -Continue PPI  Goals of care -Palliative care consulted and appreciated -Spoke with son, not sure he can take care of his dad and provide for him 24 hour care at home.  -Spoke with patient's son at bedside, agreed with hospice. Residential hospice referral made  DVT Prophylaxis  heparin  Code Status:DNR  Family Communication: Son at  bedside  Disposition Plan: Admitted. Pending hospice placement.   Consultants Cardiology Palliative care  Procedures  R/LHC  Antibiotics     Anti-infectives    None      Subjective:   [name] seen and examined today.  Patient feels weak this morning and continues to have shortness of breath.  Currently, he denies currently chest pain, abdominal pain, nausea or vomiting, diarrhea or constipation.  Objective:   Vitals:   02/18/16 0500 02/18/16 0748 02/18/16 0855 02/18/16 1100  BP: 108/70 (!) 116/53  106/61  Pulse: (!) 102     Resp: 20     Temp: 97.5 F (36.4 C)     TempSrc: Oral     SpO2: 100%  100%   Weight: 62.2 kg (137 lb 3.2 oz)     Height:        Intake/Output Summary (Last 24 hours) at 02/18/16 1330 Last data filed at 02/18/16 1114  Gross per 24 hour  Intake              463 ml  Output              300 ml  Net              163 ml   Filed Weights   02/16/16 0530 02/17/16 0450 02/18/16 0500  Weight: 68.7 kg (151 lb 6.4 oz) 66.5 kg (146 lb 9.7 oz) 62.2 kg (137 lb 3.2 oz)    Exam  General: Well developed, well nourished, no apparent distress  HEENT: NCAT, mucous membranes moist.   Cardiovascular: S1 S2 auscultated, 2/6SEM, RRR  Respiratory: Diminished breath sounds, but clear  Abdomen: Soft, nontender, nondistended, + bowel sounds  Extremities: warm dry without cyanosis clubbing. +LE edema  Neuro: AAOx3, nonfocal (patient does have a speech impediment at baseline)  Psych: Appropriate mood and affect, pleasant   Data Reviewed: I have personally reviewed following labs and imaging studies  CBC:  Recent Labs Lab 02/12/16 2011  02/15/16 0432 02/15/16 1521 02/16/16 0411 02/17/16 0300 02/18/16 0355  WBC 6.7  < > 6.5 5.9 7.0 8.6 9.8  NEUTROABS 5.1  --   --   --   --   --   --   HGB 10.1*  < > 9.3* 9.1* 9.4* 9.7* 9.2*  HCT 29.8*  < > 27.7* 28.1* 28.8* 30.0* 28.4*  MCV 97.7  < > 96.9 98.6 98.6 98.7 98.3  PLT 139*  < > 139* 127* 133* 141* 131*  < > = values in this interval not displayed. Basic Metabolic Panel:  Recent Labs Lab 02/14/16 0539 02/15/16 0432 02/15/16 1521  02/16/16 0411 02/17/16 0300 02/18/16 0355  NA 137 139  --  139 136 137  K 3.4* 3.6  --  4.0 4.0 3.8  CL 101 103  --  105 103 103  CO2 26 24  --  23 20* 22  GLUCOSE 187* 145*  --  165* 222* 198*  BUN 43* 48*  --  49* 67* 84*  CREATININE 1.60* 1.73* 1.66* 1.80* 2.35* 2.74*  CALCIUM 8.9 9.2  --  9.2 9.0 8.6*   GFR: Estimated Creatinine Clearance: 17.8 mL/min (by C-G formula based on SCr of 2.74 mg/dL (H)). Liver Function Tests:  Recent Labs Lab 02/12/16 2011  AST 21  ALT 13*  ALKPHOS 200*  BILITOT 0.7  PROT 6.6  ALBUMIN 3.5   No results for input(s): LIPASE, AMYLASE in the last 168 hours. No results for input(s): AMMONIA in the last 168 hours. Coagulation Profile:  Recent Labs Lab 02/13/16 0005  INR 1.15   Cardiac  Enzymes:  Recent Labs Lab 02/13/16 0427 02/13/16 1103 02/17/16 0300 02/17/16 0807 02/17/16 1404  TROPONINI 1.05* 0.98* 0.36* 0.25* 0.28*   BNP (last 3 results) No results for input(s): PROBNP in the last 8760 hours. HbA1C: No results for input(s): HGBA1C in the last 72 hours. CBG:  Recent Labs Lab 02/17/16 1104 02/17/16 1739 02/17/16 2121 02/18/16 0727 02/18/16 1138  GLUCAP 256* 268* 281* 243* 227*   Lipid Profile: No results for input(s): CHOL, HDL, LDLCALC, TRIG, CHOLHDL, LDLDIRECT in the last 72 hours. Thyroid Function Tests: No results for input(s): TSH, T4TOTAL, FREET4, T3FREE, THYROIDAB in the last 72 hours. Anemia Panel: No results for input(s): VITAMINB12, FOLATE, FERRITIN, TIBC, IRON, RETICCTPCT in the last 72 hours. Urine analysis:    Component Value Date/Time   COLORURINE YELLOW 11/15/2015 [place] 11/15/2015 1645   LABSPEC 1.020 11/15/2015 1645   PHURINE 5.0 11/15/2015 1645   GLUCOSEU NEGATIVE 11/15/2015 1645   HGBUR NEGATIVE 11/15/2015 1645   BILIRUBINUR NEGATIVE 11/15/2015 1645   KETONESUR NEGATIVE 11/15/2015 1645   PROTEINUR NEGATIVE 11/15/2015 1645   UROBILINOGEN 0.2 06/13/2013 1400   NITRITE  NEGATIVE 11/15/2015 1645   LEUKOCYTESUR NEGATIVE 11/15/2015 1645   Sepsis Labs: @LABRCNTIP (procalcitonin:4,lacticidven:4)  ) Recent Results (from the past 240 hour(s))  MRSA PCR Screening     Status: None   Collection Time: 02/13/16 12:12 AM  Result Value Ref Range Status   MRSA by PCR NEGATIVE NEGATIVE Final    Comment:        The GeneXpert MRSA Assay (FDA approved for NASAL specimens only), is one component of a comprehensive MRSA colonization surveillance program. It is not intended to diagnose MRSA infection nor to guide or monitor treatment for MRSA infections.       Radiology Studies: No results found.   Scheduled Meds: . aspirin EC  81 mg Oral q morning - 10a  . atorvastatin  20 mg Oral QPM  . carvedilol  12.5 mg Oral BID WC  . cholecalciferol  2,000 Units Oral Daily  . diltiazem  180 mg Oral [id number]  . diphenhydrAMINE  25 mg Oral QHS  . doxazosin  2 mg Oral Daily  . ferrous gluconate  324 mg Oral Daily  . fluticasone  1 spray Each Nare BID  . furosemide  40 mg Oral Daily  . heparin  5,000 Units Subcutaneous Q8H  . insulin aspart  0-5 Units Subcutaneous QHS  . insulin aspart  0-9 Units Subcutaneous TID WC  . ipratropium  0.5 mg Nebulization BID  . isosorbide dinitrate  10 mg Oral TID  . levalbuterol  1.25 mg Nebulization BID  . loratadine  10 mg Oral Daily  . multivitamins with iron  1 tablet Oral Daily  . niacin  500 mg Oral Daily  . omega-3 acid ethyl esters  1 g Oral Daily  . pantoprazole  40 mg Oral Daily  . sodium chloride flush  3 mL Intravenous Q12H  . sodium chloride flush  3 mL Intravenous Q12H  . temazepam  30 mg Oral QHS   Continuous Infusions:    LOS: 6 days   Time Spent in minutes   30 minutes  Adilen Pavelko D.O. on 02/18/2016 at 1:30 PM  Between 7am to 7pm - Pager - [phone]  After 7pm go to [email or web address] - password [email or web address]  And look for the night coverage person covering for me after hours  [facility] Office   [phone]

## 2016-02-18 NOTE — Clinical Social Work Note (Signed)
Patient has been accepted by [facility] in [place]. Son Randall Hiss agreeable and notified of discharge. DC Summary faxed over to facility. Transport requested. CSW signing off.  Liz Beach MSW, [place], [place], [id number]

## 2016-02-18 NOTE — Plan of Care (Signed)
Problem: Phase I Progression Outcomes Goal: Distal pulses equal to baseline Outcome: Completed/Met Date Met: 02/18/16 Right femoral site remains clean, dry, soft and intact with skin warm and dry and pulses palpable, VSS with exception of his sats at times. Patient given instruction on how to keep it clean and verbalized understanding but will need reinforcement prior to D/C

## 2016-02-18 NOTE — Clinical Social Work Note (Signed)
CSW was informed that the patient and family are requesting residential hospice placement and is ready for discharge. CSW spoke with son Randall Hiss to confirm. Preference for hospice home placement is [facility] followed by [facility] if no availability at [facility]. Referrals made.  Liz Beach MSW, [place], [place], [id number]

## 2016-02-18 NOTE — Progress Notes (Signed)
Palliative Medicine RN Note: Saw pt to eval for decline & deicison re: hospice vs SNF.  Pt is in the recliner with O2 on, RR 24-26. He is unable to stay awake to answer questions for me. Got ativan early this am (before 0300) but did not get any pain medicine. OT was going in to see pt as I was leaving. Will wait for report from them before calling family with recs re: d/c plan. If pt is able to tolerate OT, PMT recommends pursuing SNF plan. However, if pt remains this somnolent during OT visit today, then PMT recommends inpt hospice.  Update: 1155 Rec'd call from OT. She states pt has had steep decline. Order for SW to find residential hospice placement is in the chart now (per family request). PMT will follow up tomorrow to ensure pt is discharged.  Marjie Skiff Navie Lamoreaux, RN, BSN, [facility] 02/18/2016 11:55 AM Cell [phone] 8:00-4:00 Monday-Friday Office [phone]

## 2016-02-18 NOTE — Consult Note (Signed)
[facility] [employer]  Received request from [place] for family interest in [facility]. Chart reviewed and received report from bedside RN. Unfortunately [facility] does not have room to offer [name] today. Will follow up in am and update CSW and family.   Thank you.  Erling Conte, [place]

## 2016-02-19 ENCOUNTER — Other Ambulatory Visit: Payer: Self-pay | Admitting: *Deleted

## 2016-02-19 NOTE — Patient Outreach (Signed)
[facility]) Care Management [place] [facility] 02/19/2016  JAIRON ARDELEAN 1931/03/14 [id number]  Received secure message through EMR from Natividad Brood, [place], regarding [name], 82 y/o male referred to [facility] for transition of Care after recent hospitalization November 14-20, 2017, for ARF with hypoxia, secondary to combined CHF exacerbation.  Patient was noted to have NSTEMI and fall which resulted in (R) humerus fracture.  Patient has history including, but not limited to, CKD stage III, DM, HTN, and HLD.  Today, [place] notified [place] that patient had been accepted into [facility] and would no longer be active with [facility] CM, as services are now provided through Hospice.  Plan:  Will close patient's case for [facility].  Oneta Rack, RN, BSN, [facility] Care Management  [phone]

## 2016-02-29 DEATH — deceased

## 2016-03-03 ENCOUNTER — Encounter: Payer: Self-pay | Admitting: Vascular Surgery

## 2016-03-05 ENCOUNTER — Ambulatory Visit (INDEPENDENT_AMBULATORY_CARE_PROVIDER_SITE_OTHER): Payer: Medicare Other | Admitting: Physician Assistant

## 2016-03-06 ENCOUNTER — Encounter (HOSPITAL_COMMUNITY): Payer: Medicare Other

## 2016-03-06 ENCOUNTER — Ambulatory Visit: Payer: Medicare Other | Admitting: Vascular Surgery

## 2016-03-27 NOTE — Telephone Encounter (Signed)
Closed encounter °

## 2017-05-04 IMAGING — CR DG HUMERUS 2V *R*
2 series · 2 of 2 positions shown · non-contrast
Comparison: None.

CLINICAL DATA: Fall with arm pain and swelling

EXAM:
RIGHT HUMERUS - 2+ VIEW

[humerus ap]
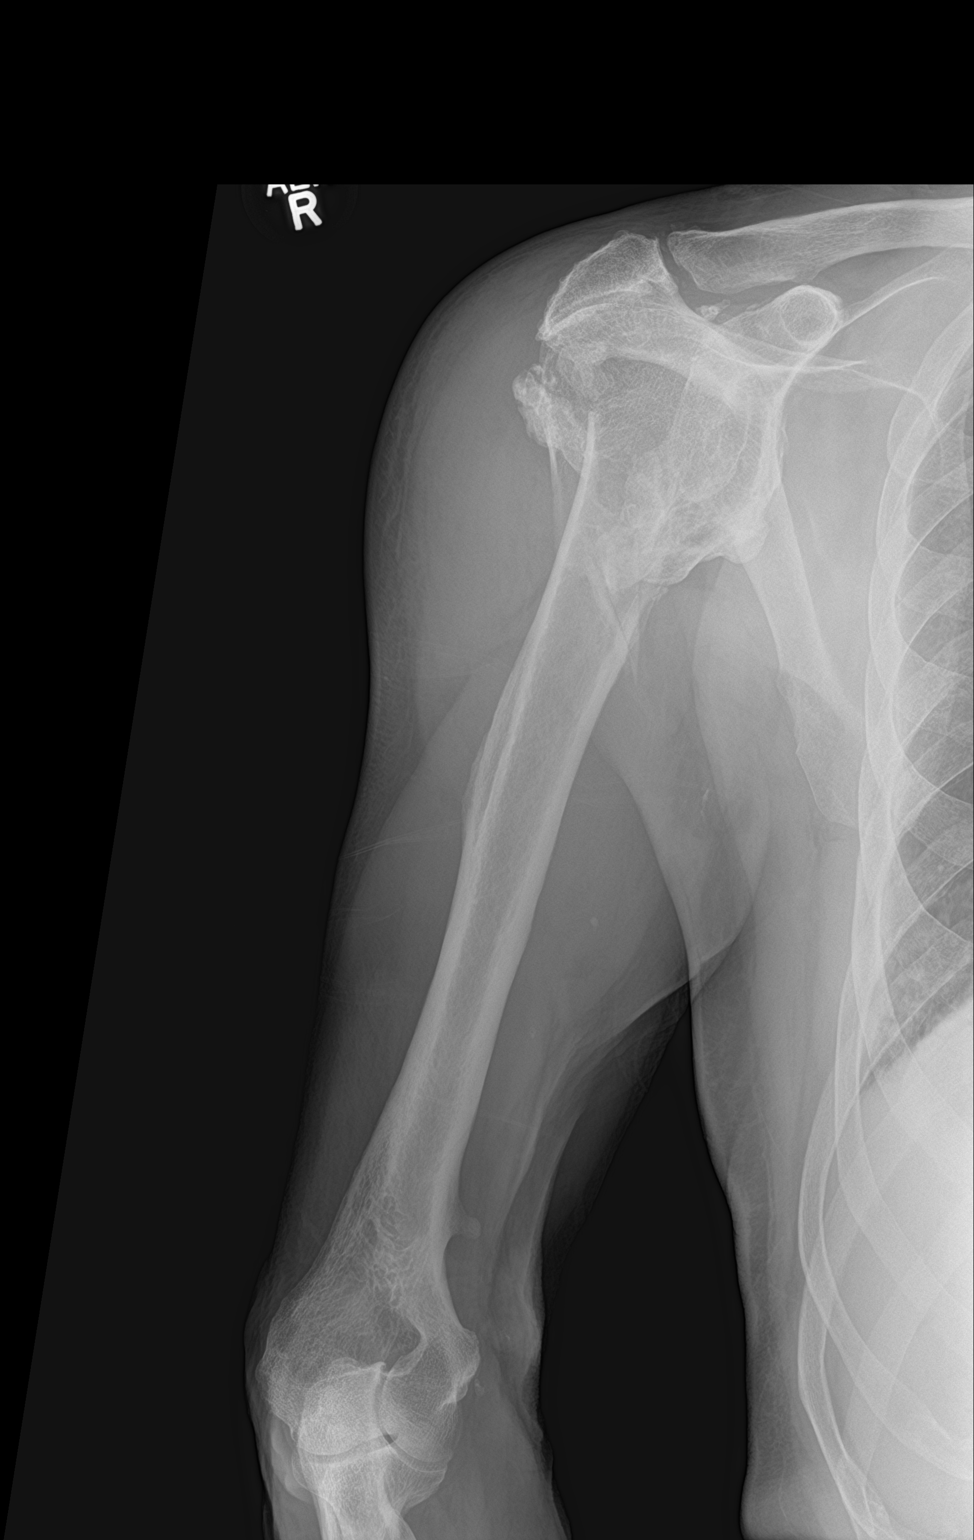

[humerus lat]
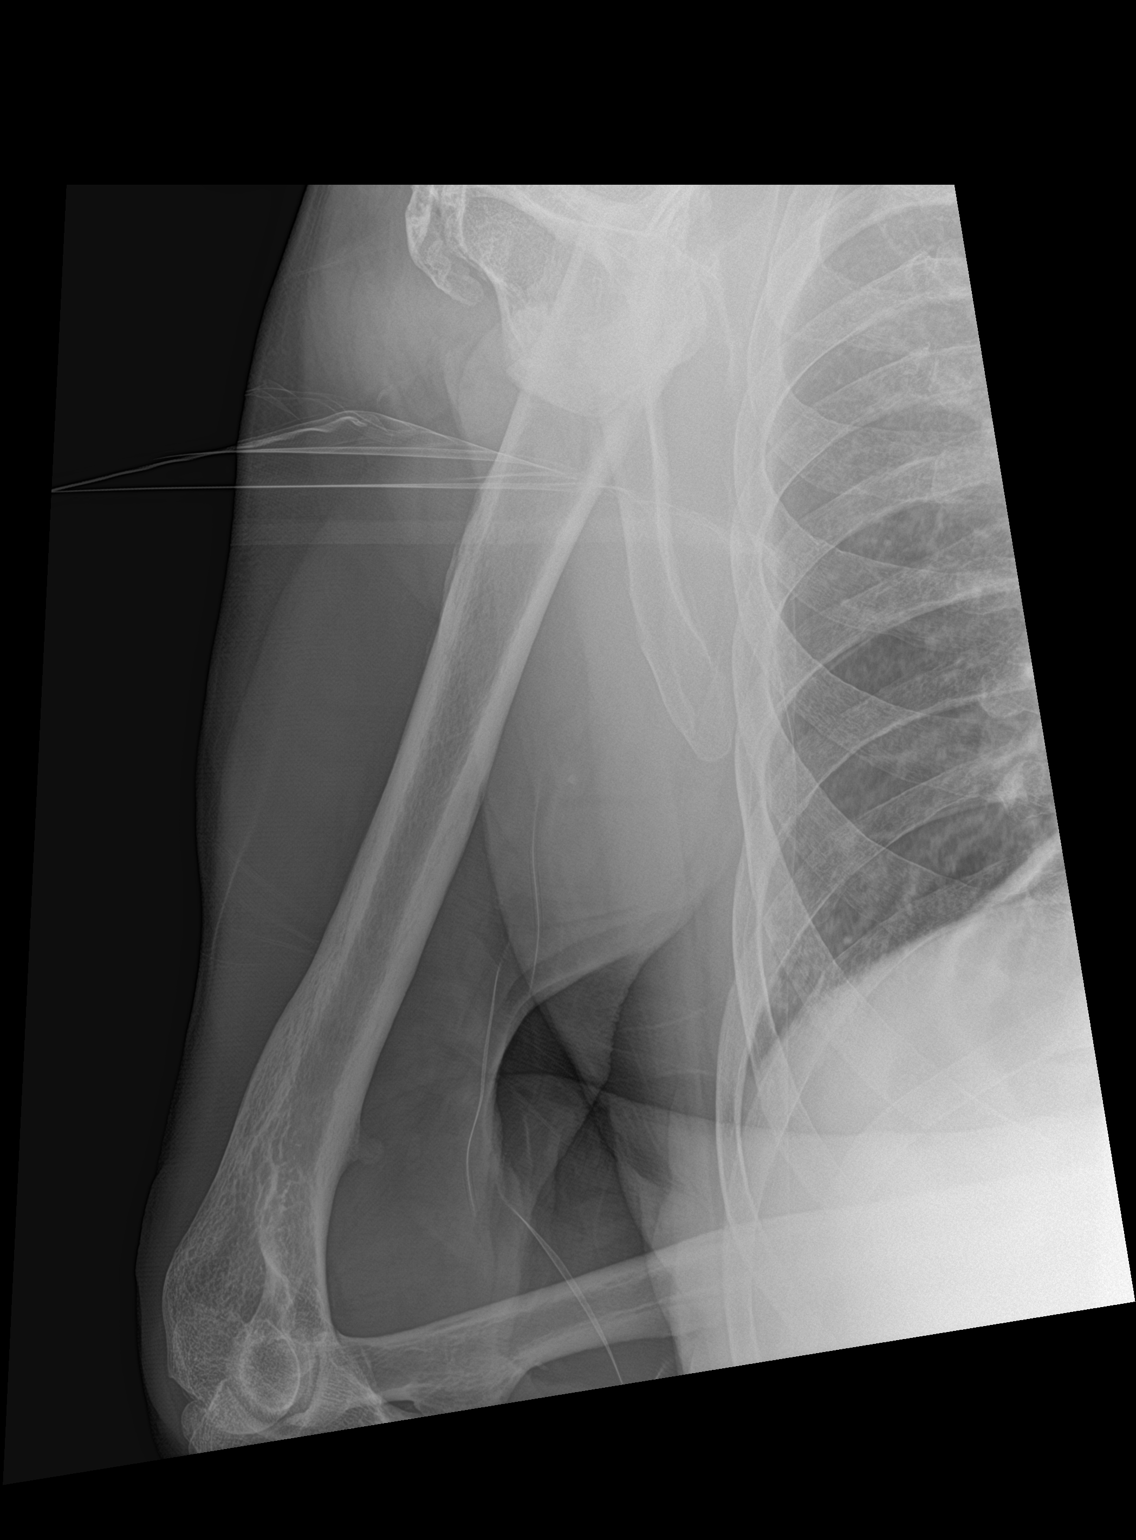

[2 of 2 positions shown; findings below may reference images not displayed]

FINDINGS: Two views of the right humerus demonstrate an oblique fracture of
the right humeral neck and proximal shaft. Fracture appears
impacted, there is approximately 2.5 cm of overriding of fracture
segments. Moderate angulation of posterior fracture fragment. The
humeral head does not appear dislocated. The face min of the
subacromial space presumed rotator cuff pathology. Soft tissue
swelling over the shoulder. Bulky calcifications surrounding the
right humeral head are again noted. There are suspected old
right-sided rib fractures. There is degenerative change of the right
AC joint.
IMPRESSION: Overriding and angulated fracture of the proximal humerus.

Suspect old right-sided rib fractures.

## 2017-05-04 IMAGING — CR DG SHOULDER 2+V*R*
2 series · 2 of 2 positions shown · non-contrast
Comparison: None.

CLINICAL DATA: Fall with pain and swelling

EXAM:
RIGHT SHOULDER - 2+ VIEW

[shoulder grashey]
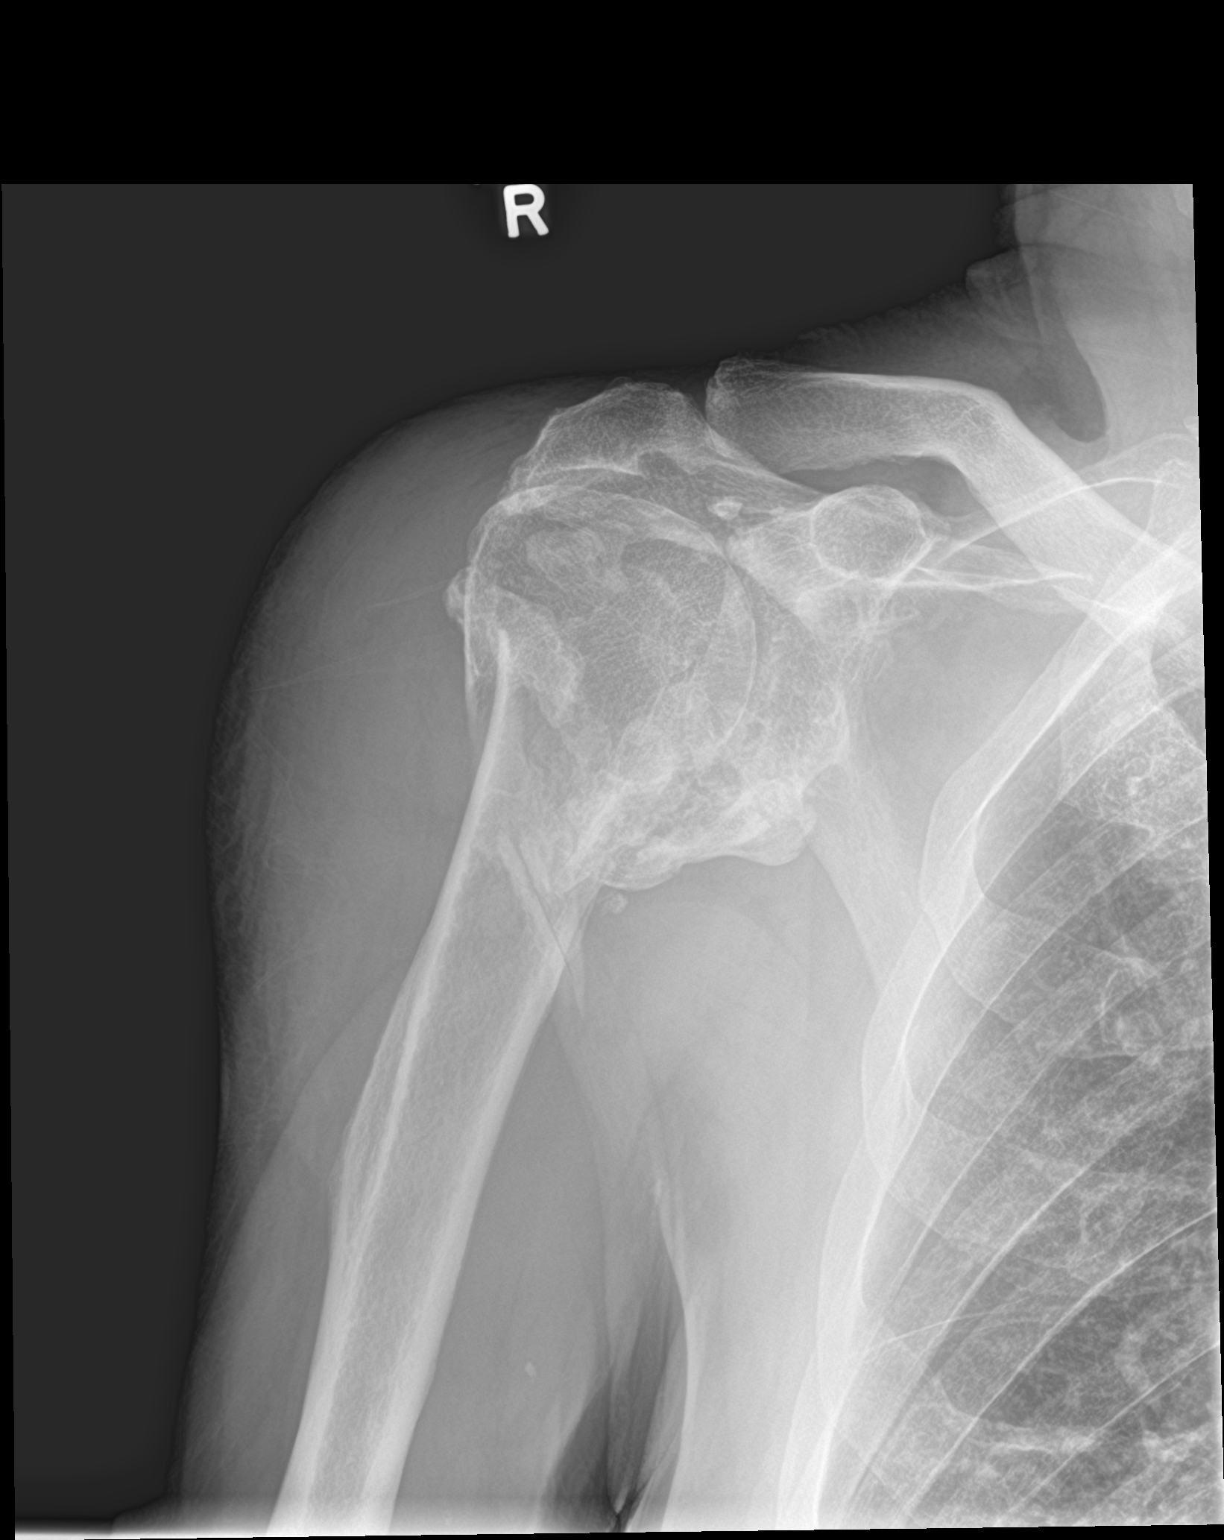

[shoulder y view]
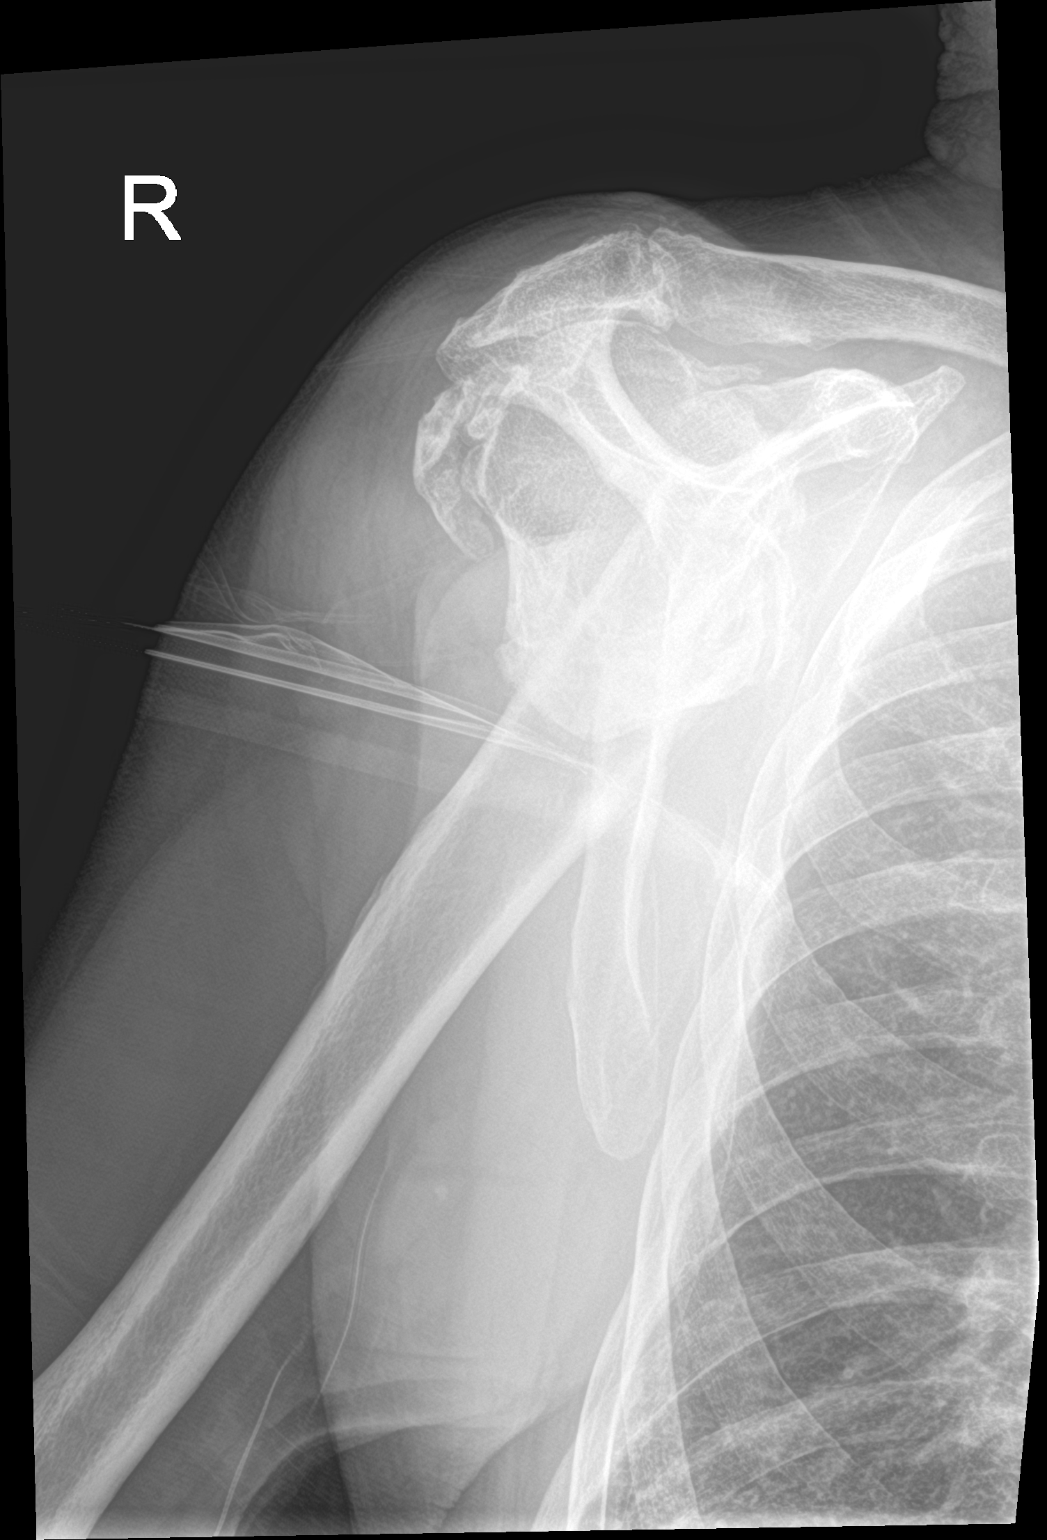

[2 of 2 positions shown; findings below may reference images not displayed]

FINDINGS: Soft tissue swelling over the right shoulder. Superior migration of
the right humoral head suggests rotator cuff disease. There is an
oblique fracture of the proximal shaft/subcapital portion of the
right humerus. There is overriding of the fracture fragments. There
is moderate angular deformity of the distal fracture fragment.
Humeral head does not appear dislocated. AC joint degenerative
changes.
IMPRESSION: Angulated, overriding fracture of the proximal humerus.

## 2017-05-04 IMAGING — CR DG ELBOW 2V*R*
2 series · 2 of 2 positions shown · non-contrast
Comparison: None.

CLINICAL DATA: Fall was swelling and deformity in

EXAM:
RIGHT ELBOW - 2 VIEW

[elbow ap]
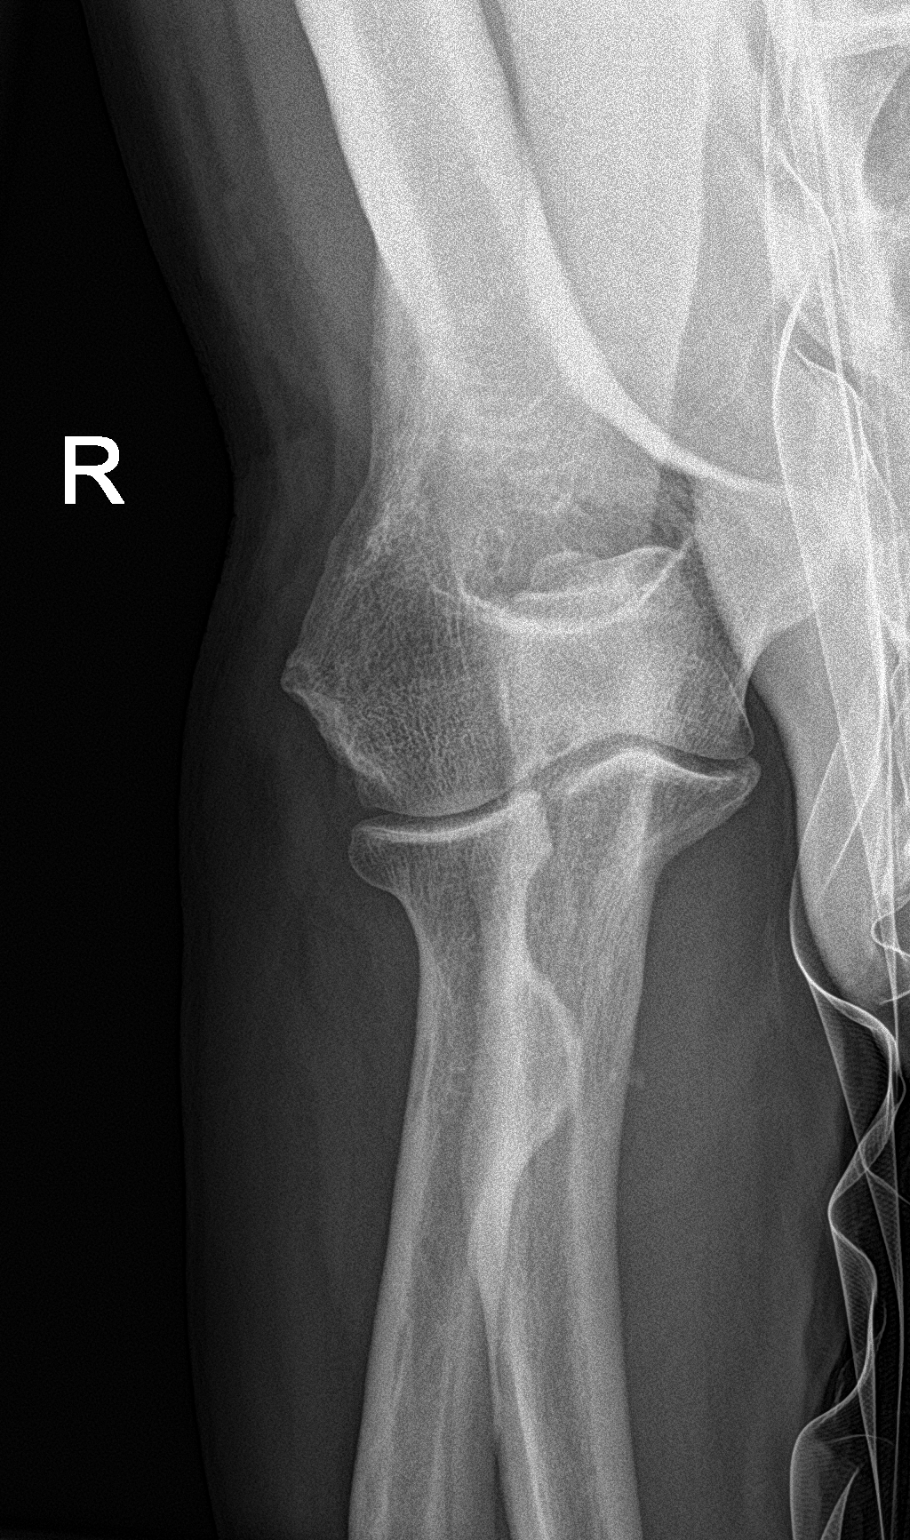

[elbow lat]
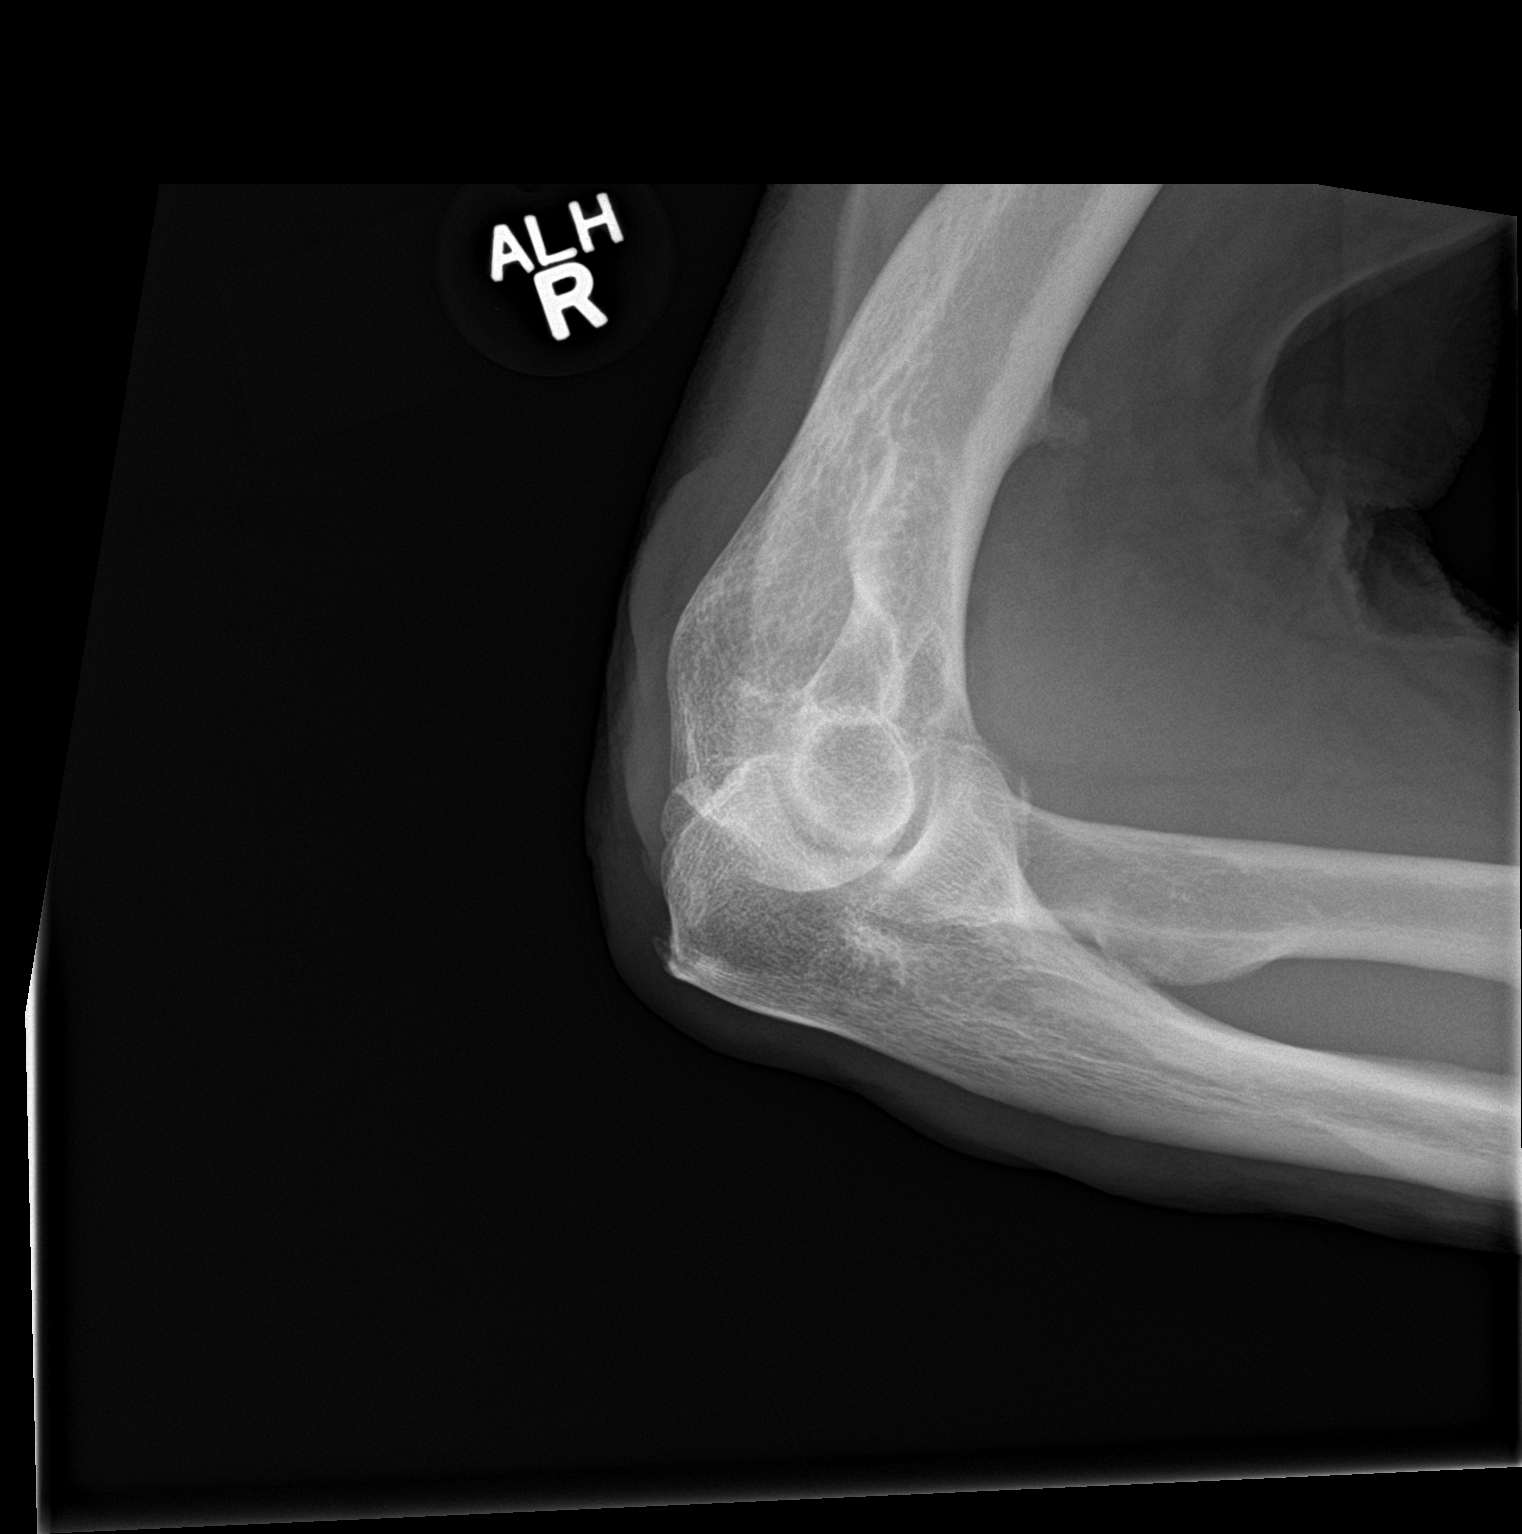

[2 of 2 positions shown; findings below may reference images not displayed]

FINDINGS: Two views of the right elbow show no large elbow effusion. Radial
head alignment grossly normal. No obvious fracture.
IMPRESSION: No gross fracture malalignment of the right elbow

## 2017-05-26 IMAGING — CR DG CHEST 1V PORT
1 series · 1 of 1 positions shown · non-contrast
Comparison: 02/12/2016

CLINICAL DATA: Hypoxia

EXAM:
PORTABLE CHEST 1 VIEW

[AP]
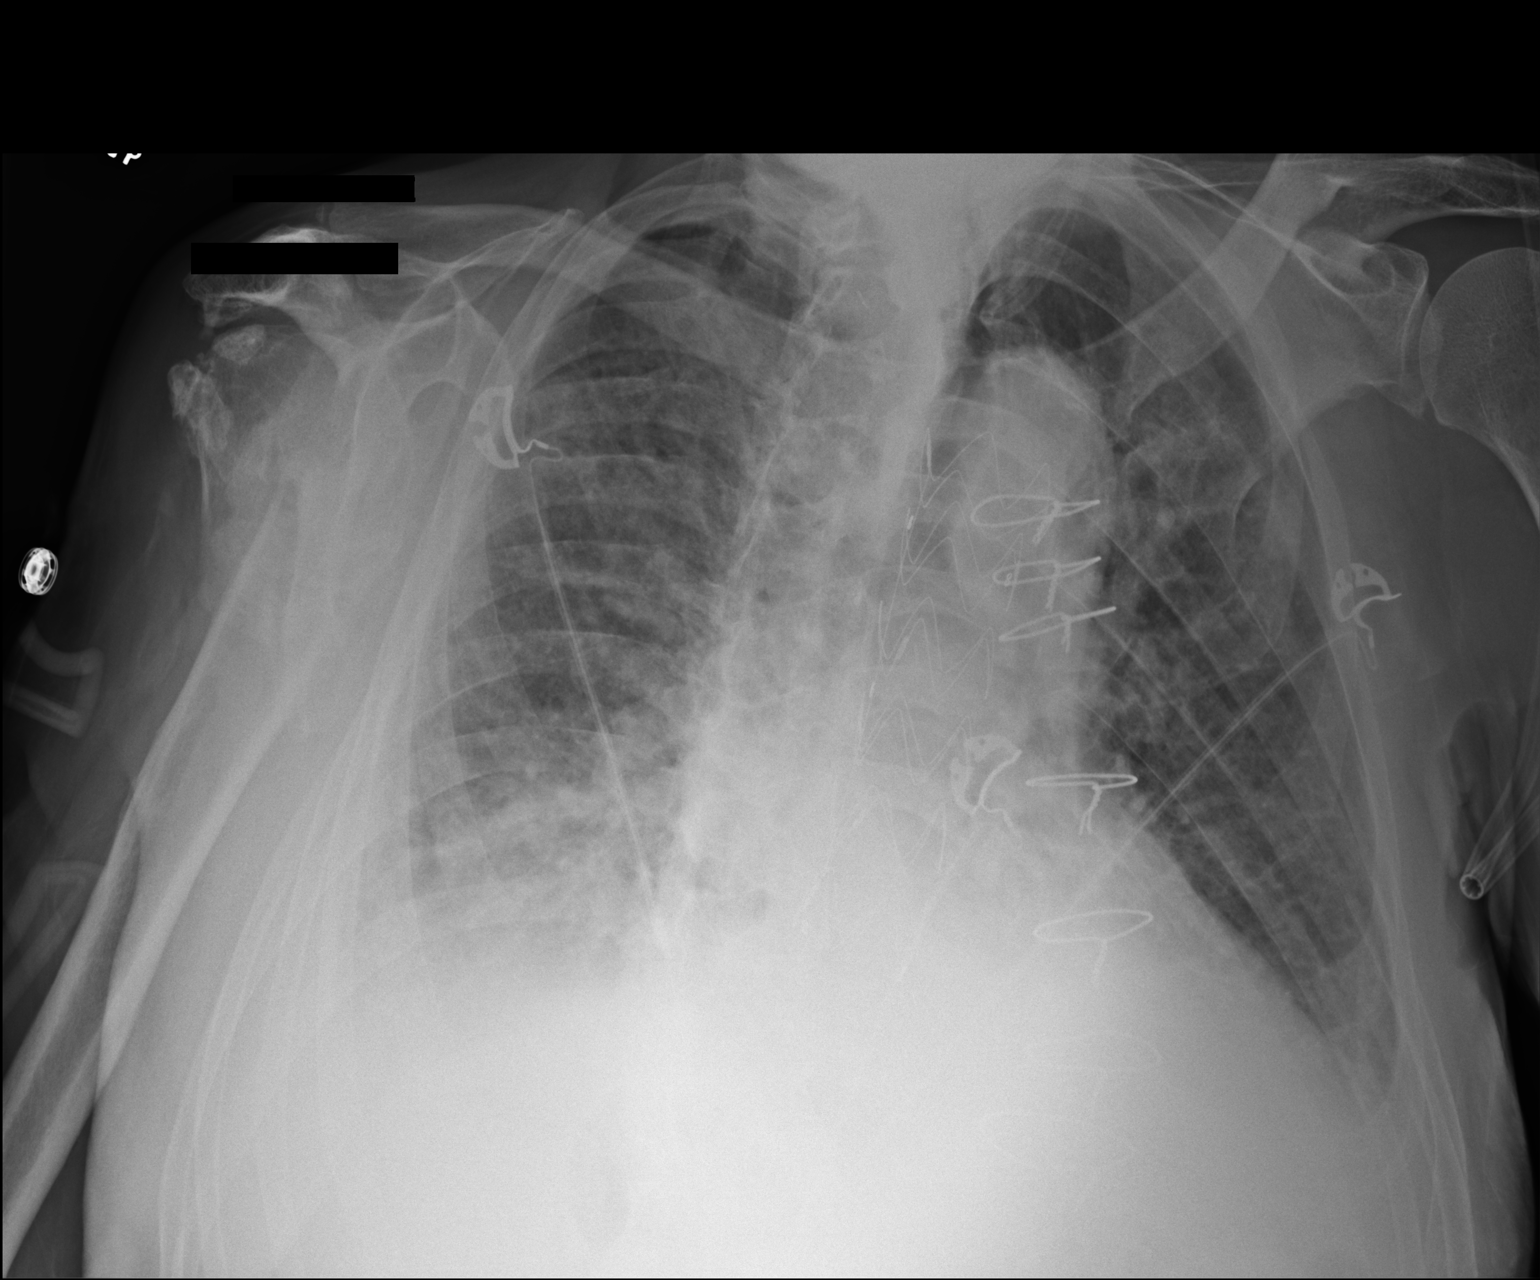

[1 of 1 positions shown; findings below may reference images not displayed]

FINDINGS: Prior median sternotomy. Stent graft noted within the descending
thoracic aorta. There is cardiomegaly with bilateral airspace
opacities and small layering effusions. Findings compatible with
mild CHF.
IMPRESSION: Continued mild CHF pattern with small bilateral layering effusions.
